# Patient Record
Sex: Male | Born: 1960 | Race: White | Hispanic: No | Smoking: Former smoker
Health system: Southern US, Community
[De-identification: ages and names within clinical notes are randomized; demographics above are authoritative.]

## PROBLEM LIST (undated history)

## (undated) DIAGNOSIS — Z9189 Other specified personal risk factors, not elsewhere classified: Secondary | ICD-10-CM

## (undated) DIAGNOSIS — R079 Chest pain, unspecified: Secondary | ICD-10-CM

## (undated) DIAGNOSIS — S31139A Puncture wound of abdominal wall without foreign body, unspecified quadrant without penetration into peritoneal cavity, initial encounter: Secondary | ICD-10-CM

## (undated) DIAGNOSIS — W3400XA Accidental discharge from unspecified firearms or gun, initial encounter: Secondary | ICD-10-CM

## (undated) DIAGNOSIS — F1210 Cannabis abuse, uncomplicated: Secondary | ICD-10-CM

## (undated) DIAGNOSIS — E669 Obesity, unspecified: Secondary | ICD-10-CM

## (undated) DIAGNOSIS — C189 Malignant neoplasm of colon, unspecified: Secondary | ICD-10-CM

## (undated) DIAGNOSIS — W57XXXA Bitten or stung by nonvenomous insect and other nonvenomous arthropods, initial encounter: Secondary | ICD-10-CM

## (undated) DIAGNOSIS — J45909 Unspecified asthma, uncomplicated: Secondary | ICD-10-CM

## (undated) DIAGNOSIS — J449 Chronic obstructive pulmonary disease, unspecified: Secondary | ICD-10-CM

## (undated) DIAGNOSIS — Z720 Tobacco use: Secondary | ICD-10-CM

## (undated) DIAGNOSIS — I639 Cerebral infarction, unspecified: Secondary | ICD-10-CM

## (undated) DIAGNOSIS — C679 Malignant neoplasm of bladder, unspecified: Secondary | ICD-10-CM

## (undated) DIAGNOSIS — N200 Calculus of kidney: Secondary | ICD-10-CM

## (undated) HISTORY — PX: EXPLORATORY LAPAROTOMY: SUR591

## (undated) HISTORY — DX: Obesity, unspecified: E66.9

## (undated) HISTORY — DX: Calculus of kidney: N20.0

## (undated) HISTORY — PX: COLONOSCOPY: SHX5424

## (undated) HISTORY — PX: OTHER SURGICAL HISTORY: SHX169

## (undated) HISTORY — PX: CHOLECYSTECTOMY: SHX55

## (undated) HISTORY — PX: CYSTOSCOPY/RETROGRADE/URETEROSCOPY/STONE EXTRACTION WITH BASKET: SHX5317

## (undated) HISTORY — DX: Tobacco use: Z72.0

## (undated) HISTORY — DX: Cannabis abuse, uncomplicated: F12.10

## (undated) HISTORY — DX: Chest pain, unspecified: R07.9

## (undated) HISTORY — PX: ESOPHAGOGASTRODUODENOSCOPY: SHX5428

## (undated) HISTORY — PX: HERNIA REPAIR: SHX51

---

## 2001-01-10 ENCOUNTER — Encounter (HOSPITAL_COMMUNITY)
Admission: RE | Admit: 2001-01-10 | Discharge: 2001-02-09 | Payer: Self-pay | Admitting: Certified Registered Nurse Anesthetist

## 2001-02-07 ENCOUNTER — Ambulatory Visit (HOSPITAL_COMMUNITY): Admission: RE | Admit: 2001-02-07 | Discharge: 2001-02-07 | Payer: Self-pay | Admitting: Family Medicine

## 2001-02-07 ENCOUNTER — Encounter: Payer: Self-pay | Admitting: Family Medicine

## 2001-06-12 ENCOUNTER — Encounter: Payer: Self-pay | Admitting: Family Medicine

## 2001-06-12 ENCOUNTER — Inpatient Hospital Stay (HOSPITAL_COMMUNITY): Admission: RE | Admit: 2001-06-12 | Discharge: 2001-06-15 | Payer: Self-pay | Admitting: Family Medicine

## 2001-06-12 ENCOUNTER — Ambulatory Visit (HOSPITAL_COMMUNITY): Admission: RE | Admit: 2001-06-12 | Discharge: 2001-06-12 | Payer: Self-pay | Admitting: Family Medicine

## 2001-06-13 ENCOUNTER — Encounter: Payer: Self-pay | Admitting: Neurology

## 2001-06-13 ENCOUNTER — Encounter: Payer: Self-pay | Admitting: Family Medicine

## 2001-07-19 ENCOUNTER — Encounter: Payer: Self-pay | Admitting: Emergency Medicine

## 2001-07-19 ENCOUNTER — Emergency Department (HOSPITAL_COMMUNITY): Admission: EM | Admit: 2001-07-19 | Discharge: 2001-07-20 | Payer: Self-pay | Admitting: Emergency Medicine

## 2001-07-20 ENCOUNTER — Encounter: Payer: Self-pay | Admitting: Emergency Medicine

## 2002-02-06 ENCOUNTER — Encounter: Payer: Self-pay | Admitting: Family Medicine

## 2002-02-06 ENCOUNTER — Ambulatory Visit (HOSPITAL_COMMUNITY): Admission: RE | Admit: 2002-02-06 | Discharge: 2002-02-06 | Payer: Self-pay | Admitting: Family Medicine

## 2003-03-19 ENCOUNTER — Ambulatory Visit (HOSPITAL_COMMUNITY): Admission: RE | Admit: 2003-03-19 | Discharge: 2003-03-19 | Payer: Self-pay | Admitting: Family Medicine

## 2003-03-19 ENCOUNTER — Encounter: Payer: Self-pay | Admitting: Family Medicine

## 2003-04-27 ENCOUNTER — Emergency Department (HOSPITAL_COMMUNITY): Admission: EM | Admit: 2003-04-27 | Discharge: 2003-04-28 | Payer: Self-pay | Admitting: Emergency Medicine

## 2003-11-01 ENCOUNTER — Ambulatory Visit (HOSPITAL_COMMUNITY): Admission: RE | Admit: 2003-11-01 | Discharge: 2003-11-01 | Payer: Self-pay | Admitting: Family Medicine

## 2004-04-19 ENCOUNTER — Emergency Department (HOSPITAL_COMMUNITY): Admission: EM | Admit: 2004-04-19 | Discharge: 2004-04-19 | Payer: Self-pay | Admitting: Emergency Medicine

## 2004-04-21 ENCOUNTER — Ambulatory Visit (HOSPITAL_COMMUNITY): Admission: RE | Admit: 2004-04-21 | Discharge: 2004-04-21 | Payer: Self-pay | Admitting: Orthopaedic Surgery

## 2005-04-21 ENCOUNTER — Emergency Department (HOSPITAL_COMMUNITY): Admission: EM | Admit: 2005-04-21 | Discharge: 2005-04-21 | Payer: Self-pay | Admitting: Emergency Medicine

## 2005-07-23 ENCOUNTER — Ambulatory Visit (HOSPITAL_COMMUNITY): Admission: RE | Admit: 2005-07-23 | Discharge: 2005-07-23 | Payer: Self-pay | Admitting: Family Medicine

## 2005-09-14 ENCOUNTER — Ambulatory Visit (HOSPITAL_COMMUNITY): Admission: RE | Admit: 2005-09-14 | Discharge: 2005-09-14 | Payer: Self-pay | Admitting: Urology

## 2006-02-09 ENCOUNTER — Emergency Department (HOSPITAL_COMMUNITY): Admission: EM | Admit: 2006-02-09 | Discharge: 2006-02-09 | Payer: Self-pay | Admitting: Emergency Medicine

## 2006-06-30 ENCOUNTER — Ambulatory Visit (HOSPITAL_COMMUNITY): Admission: RE | Admit: 2006-06-30 | Discharge: 2006-06-30 | Payer: Self-pay | Admitting: Family Medicine

## 2006-06-30 ENCOUNTER — Emergency Department (HOSPITAL_COMMUNITY): Admission: EM | Admit: 2006-06-30 | Discharge: 2006-06-30 | Payer: Self-pay | Admitting: Emergency Medicine

## 2007-05-22 ENCOUNTER — Ambulatory Visit (HOSPITAL_COMMUNITY): Admission: RE | Admit: 2007-05-22 | Discharge: 2007-05-22 | Payer: Self-pay | Admitting: Family Medicine

## 2007-07-25 ENCOUNTER — Ambulatory Visit (HOSPITAL_COMMUNITY): Admission: RE | Admit: 2007-07-25 | Discharge: 2007-07-25 | Payer: Self-pay | Admitting: Family Medicine

## 2007-08-16 ENCOUNTER — Emergency Department (HOSPITAL_COMMUNITY): Admission: EM | Admit: 2007-08-16 | Discharge: 2007-08-16 | Payer: Self-pay | Admitting: Emergency Medicine

## 2007-09-29 ENCOUNTER — Ambulatory Visit (HOSPITAL_COMMUNITY): Admission: RE | Admit: 2007-09-29 | Discharge: 2007-09-29 | Payer: Self-pay | Admitting: Family Medicine

## 2007-12-29 ENCOUNTER — Emergency Department (HOSPITAL_COMMUNITY): Admission: EM | Admit: 2007-12-29 | Discharge: 2007-12-29 | Payer: Self-pay | Admitting: Emergency Medicine

## 2008-09-13 DIAGNOSIS — I639 Cerebral infarction, unspecified: Secondary | ICD-10-CM

## 2008-09-13 HISTORY — DX: Cerebral infarction, unspecified: I63.9

## 2008-10-24 ENCOUNTER — Ambulatory Visit (HOSPITAL_COMMUNITY): Admission: RE | Admit: 2008-10-24 | Discharge: 2008-10-24 | Payer: Self-pay | Admitting: Family Medicine

## 2009-01-11 ENCOUNTER — Emergency Department (HOSPITAL_COMMUNITY): Admission: EM | Admit: 2009-01-11 | Discharge: 2009-01-11 | Payer: Self-pay | Admitting: Emergency Medicine

## 2009-03-01 ENCOUNTER — Emergency Department (HOSPITAL_COMMUNITY): Admission: EM | Admit: 2009-03-01 | Discharge: 2009-03-01 | Payer: Self-pay | Admitting: Emergency Medicine

## 2009-10-03 ENCOUNTER — Ambulatory Visit (HOSPITAL_COMMUNITY): Admission: RE | Admit: 2009-10-03 | Discharge: 2009-10-03 | Payer: Self-pay | Admitting: Family Medicine

## 2010-01-19 ENCOUNTER — Ambulatory Visit (HOSPITAL_COMMUNITY): Admission: RE | Admit: 2010-01-19 | Discharge: 2010-01-19 | Payer: Self-pay | Admitting: Family Medicine

## 2010-01-30 ENCOUNTER — Ambulatory Visit (HOSPITAL_COMMUNITY): Admission: RE | Admit: 2010-01-30 | Discharge: 2010-01-30 | Payer: Self-pay | Admitting: *Deleted

## 2010-02-11 ENCOUNTER — Ambulatory Visit (HOSPITAL_COMMUNITY): Admission: RE | Admit: 2010-02-11 | Discharge: 2010-02-12 | Payer: Self-pay | Admitting: Urology

## 2010-02-11 ENCOUNTER — Encounter (INDEPENDENT_AMBULATORY_CARE_PROVIDER_SITE_OTHER): Payer: Self-pay | Admitting: Urology

## 2010-04-16 ENCOUNTER — Ambulatory Visit: Payer: Self-pay | Admitting: Gastroenterology

## 2010-04-16 DIAGNOSIS — R1310 Dysphagia, unspecified: Secondary | ICD-10-CM | POA: Insufficient documentation

## 2010-04-16 DIAGNOSIS — K921 Melena: Secondary | ICD-10-CM | POA: Insufficient documentation

## 2010-04-16 DIAGNOSIS — K5900 Constipation, unspecified: Secondary | ICD-10-CM | POA: Insufficient documentation

## 2010-04-16 DIAGNOSIS — K219 Gastro-esophageal reflux disease without esophagitis: Secondary | ICD-10-CM | POA: Insufficient documentation

## 2010-04-16 DIAGNOSIS — R109 Unspecified abdominal pain: Secondary | ICD-10-CM | POA: Insufficient documentation

## 2010-04-22 ENCOUNTER — Encounter: Payer: Self-pay | Admitting: Gastroenterology

## 2010-05-14 ENCOUNTER — Ambulatory Visit (HOSPITAL_COMMUNITY): Admission: RE | Admit: 2010-05-14 | Discharge: 2010-05-14 | Payer: Self-pay | Admitting: Internal Medicine

## 2010-05-14 ENCOUNTER — Ambulatory Visit: Payer: Self-pay | Admitting: Internal Medicine

## 2010-05-19 ENCOUNTER — Encounter: Payer: Self-pay | Admitting: Internal Medicine

## 2010-05-26 ENCOUNTER — Encounter: Payer: Self-pay | Admitting: Internal Medicine

## 2010-06-13 HISTORY — PX: CYSTOSTOMY W/ BLADDER BIOPSY: SHX1431

## 2010-06-22 ENCOUNTER — Ambulatory Visit (HOSPITAL_COMMUNITY): Admission: RE | Admit: 2010-06-22 | Discharge: 2010-06-22 | Payer: Self-pay | Admitting: Urology

## 2010-07-17 ENCOUNTER — Ambulatory Visit (HOSPITAL_COMMUNITY): Admission: RE | Admit: 2010-07-17 | Discharge: 2010-07-17 | Payer: Self-pay | Admitting: Family Medicine

## 2010-10-15 NOTE — Letter (Signed)
Summary: [facility] MEDICAL RECORDS  [facility] MEDICAL RECORDS   Imported By: Rexene Alberts 05/26/2010 08:52:41  _____________________________________________________________________  External Attachment:    Type:   Image     Comment:   External Document
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
## 2010-10-15 NOTE — Letter (Signed)
Summary: Patient Notice, Colon Biopsy Results  [facility]  [address]   [place], [place] [address]   Phone: [phone]  Fax: [phone]       May 19, 2010   Craig Owens [address] [place], [place]  [address] [birth year]    Dear Craig Owens,  I am pleased to inform you that the biopsies taken during your recent colonoscopy did not show any evidence of cancer upon pathologic examination.  Additional information/recommendations:  You should have a repeat colonoscopy examination  in 5 years.  Please call us if you are having persistent problems or have questions about your condition that have not been fully answered at this time.  Sincerely,    R. Roetta Sessions MD, FACP [facility] Ph: [phone]    Fax: [phone]   Appended Document: Patient Notice, Colon Biopsy Results Letter mailed to pt.  Appended Document: Patient Notice, Colon Biopsy Results reminder in computer

## 2010-10-15 NOTE — Letter (Signed)
Summary: tcs/egd/ed order  tcs/egd/ed order   Imported By: Hendricks Limes LPN 16/06/9603 54:09:81  _____________________________________________________________________  External Attachment:    Type:   Image     Comment:   External Document

## 2010-11-25 LAB — BASIC METABOLIC PANEL
CO2: 23 mEq/L (ref 19–32)
Calcium: 9.3 mg/dL (ref 8.4–10.5)
Chloride: 108 mEq/L (ref 96–112)
GFR calc Af Amer: >60 mL/min (ref >60)
Potassium: 3.7 mEq/L (ref 3.5–5.1)
Sodium: 138 mEq/L (ref 135–145)

## 2010-11-25 LAB — SURGICAL PCR SCREEN: Staphylococcus aureus: POSITIVE — AB

## 2010-11-25 LAB — CBC
HCT: 44.5 % (ref 39.0–52.0)
Hemoglobin: 15 g/dL (ref 13.0–17.0)
MCH: 29.7 pg (ref 26.0–34.0)
MCV: 88 fL (ref 78.0–100.0)
RBC: 5.06 MIL/uL (ref 4.22–5.81)

## 2010-11-26 LAB — BASIC METABOLIC PANEL WITH GFR
BUN: 9 mg/dL (ref 6–23)
CO2: 24 meq/L (ref 19–32)
Calcium: 9.2 mg/dL (ref 8.4–10.5)
Chloride: 107 meq/L (ref 96–112)
Creatinine, Ser: 0.98 mg/dL (ref 0.4–1.5)
GFR calc non Af Amer: >60 mL/min
Glucose, Bld: 144 mg/dL — ABNORMAL HIGH (ref 70–99)
Potassium: 3.8 meq/L (ref 3.5–5.1)
Sodium: 139 meq/L (ref 135–145)

## 2010-11-26 LAB — HEMOGLOBIN AND HEMATOCRIT, BLOOD
HCT: 42.8 % (ref 39.0–52.0)
HCT: 45 % (ref 39.0–52.0)
Hemoglobin: 15 g/dL (ref 13.0–17.0)

## 2010-11-30 LAB — BASIC METABOLIC PANEL
BUN: 11 mg/dL (ref 6–23)
CO2: 21 mEq/L (ref 19–32)
Calcium: 9.3 mg/dL (ref 8.4–10.5)
Chloride: 105 mEq/L (ref 96–112)
Chloride: 107 mEq/L (ref 96–112)
Creatinine, Ser: 1.05 mg/dL (ref 0.4–1.5)
GFR calc Af Amer: >60 mL/min (ref >60)
GFR calc non Af Amer: >60 mL/min (ref >60)
Glucose, Bld: 143 mg/dL — ABNORMAL HIGH (ref 70–99)
Potassium: 3.5 mEq/L (ref 3.5–5.1)
Sodium: 137 mEq/L (ref 135–145)

## 2010-11-30 LAB — DIFFERENTIAL
Eosinophils Absolute: 0.3 10*3/uL (ref 0.0–0.7)
Eosinophils Relative: 2 % (ref 0–5)
Lymphocytes Relative: 22 % (ref 12–46)
Lymphs Abs: 2.6 10*3/uL (ref 0.7–4.0)
Monocytes Absolute: 0.6 10*3/uL (ref 0.1–1.0)
Monocytes Relative: 5 % (ref 3–12)

## 2010-11-30 LAB — CBC
HCT: 41.2 % (ref 39.0–52.0)
Hemoglobin: 13.9 g/dL (ref 13.0–17.0)
Hemoglobin: 14.9 g/dL (ref 13.0–17.0)
MCHC: 33.8 g/dL (ref 30.0–36.0)
MCV: 87.8 fL (ref 78.0–100.0)
MCV: 87.2 fL (ref 78.0–100.0)
RBC: 4.69 MIL/uL (ref 4.22–5.81)
RBC: 5.07 MIL/uL (ref 4.22–5.81)
RDW: 13.4 % (ref 11.5–15.5)
WBC: 11.8 10*3/uL — ABNORMAL HIGH (ref 4.0–10.5)

## 2010-11-30 LAB — BLEEDING TIME: Bleeding Time: 4.5 minutes (ref 2.5–9.5)

## 2011-01-29 NOTE — Discharge Summary (Signed)
[facility]  Patient:    Craig Owens, Craig Owens Visit Number: [id number] MRN: [id number]          Service Type: MED Location: 3 A318 01 Attending Physician:  Alice Reichert Dictated by:   Butch Penny, M.D. Admit Date:  06/12/2001 Discharge Date: 06/15/2001                             Discharge Summary  DISCHARGE DIAGNOSES: 1. Cerebrovascular accident, right pontine area. 2. Gastroenteritis. 3. Right ureteral calculous. 4. Hyperlipidemia.  HISTORY OF PRESENT ILLNESS:  This is a 50 year old, white male admitted on June 12, 2001, and discharged on June 15, 2001, with three days of hospitalization with diagnoses of CVA, right pontine area and gastroenteritis. Condition was stable and improved at the time of discharge.  He was admitted with chief complaint being nausea, vomiting and diarrhea with lower abdominal discomfort.  The patient had impaired vision for approximately three days and developed vomiting and diarrhea with lower abdominal crampy pain.  Apparently, he noted diminished vision in both eyes.  He was admitted to the hospital.  PHYSICAL EXAMINATION:  GENERAL:  Alert, white male complaining of slight headache.  VITAL SIGNS:  Blood pressure 112/60, pulse 68, respiratory rate 18, temperature 97.5.  HEENT:  PERRLA.  TMs negative.  Oropharynx benign.  Decreased vision in right eye.  NECK:  Supple with no carotid bruits.  HEART:  Regular rate and rhythm with no murmurs.  LUNGS:  Clear to P&A.  ABDOMEN:  Surgical scars over the anterior abdomen with no palpable organs or masses.  GENITALIA:  Negative.  EXTREMITIES:  Free of edema.  NEUROLOGIC:  Weakness in both legs.  Weakness in right eye lid.  LABORATORY DATA AND X-RAY FINDINGS:  Admission CBC with WBC 9900, hemoglobin 15.2, hematocrit 43.5, 58 neutrophils, 33 lymphs, 7 monos.  Sodium 139, potassium 3.3, chloride 105, CO2 25, glucose 154, BUN 15, creatinine 1, calcium 9.1.   Total protein 6.9, albumin 3.6.  Lipid profile with cholesterol 219, triglycerides 260, HDL 33, LDL 134.  Homocystine level 12.57.  Urinalysis negative.  Chest x-ray stable with mild to moderate changes of COPD with no acute abnormality.  Carotid duplex with bilateral normal examination.  CT of the abdomen with abnormal appearance of anterior wall to prior surgery and probable colostomy.  CT of the pelvis with contrast with 67 mm diameter right ureterovesical junction calculous.  Minimal distention in the right renal collecting system.  MRI of hip without contrast with high-grade stenosis proximal to the right of superior cerebellar artery with very high-grade stenosis of the proximal right posterior and inferior cerebellar artery.  HOSPITAL COURSE:  At the time of the patients admission, he was placed on D-5-1/2 normal saline at 125 cc an hour.  Vital signs and neurologic checks were monitored q.i.d.  He was given Phenergan 25 mg q.4h. p.r.n. for nausea and Demerol 25 mg q.4h. p.r.n. pain.  He was started on Lovenox 125 mg q.12h. Neurology was asked to see him.  MRA of the brain was done and homocystine level was done.  His Lovenox was discontinued on October 1.  He was started on aspirin 325 mg daily.  Carotid Doppler ultrasound was performed which was normal.  He was subsequently placed on Plavix 75 mg daily and Lipitor 20 mg daily.  He was seen by urology.  A stone was noted in distal right ureter 67 mm in diameter.  It  was elected to give him a chance to pass the stone.  The patient remained in the hospital a total of three days and was discharged.  He will be followed as an outpatient.  DISCHARGE MEDICATIONS: 1. Plavix 75 mg daily. 2. Enteric coated aspirin 81 mg daily. 3. Lortab 5 mg q.4h. p.r.n. for pain. 4. Lipitor 20 mg daily. Dictated by:   Butch Penny, M.D. Attending Physician:  Alice Reichert DD:  07/17/01 TD:  07/18/01 Job: [id number]

## 2011-01-29 NOTE — Group Therapy Note (Signed)
[facility]  Patient:    Craig Owens, Craig Owens Visit Number: [id number] MRN: [id number]          Service Type: MED Location: 3 A318 01 Attending Physician:  Alice Reichert Dictated by:   Butch Penny, M.D. Admit Date:  06/12/2001 Discharge Date: 06/15/2001                     Progress Note/EKG Interpretations  SUBJECTIVE:  This patient was admitted to the hospital with what appeared to be CVA.  He did have abdominal pain on admission and does have a stone in his right ureter.  OBJECTIVE:  Vital Signs:  Blood pressure 109/55, respirations 20, pulse 59, temperature 97.9.  Lungs:  Clear to P&A.  Heart:  Regular rhythm.  Abdomen: No palpable organs or masses.  The patient has a scar over the anterior portion of the abdomen from previous surgery.  Seems like tenderness in the right lower quadrant.  ASSESSMENT:  The patient has a stone in his right ureter at the UV junction. He also was admitted with what appears to be CVA.  The patients condition is stable.  An MRI of his head showed occlusion, high-grade stenosis, proximal left, anterior, inferior cerebellar artery.  PLAN:  The plan is to continue with current regimen. Dictated by:   Butch Penny, M.D. Attending Physician:  Alice Reichert DD:  06/15/01 TD:  06/16/01 Job: [id number]

## 2011-03-11 ENCOUNTER — Emergency Department (HOSPITAL_COMMUNITY)
Admission: EM | Admit: 2011-03-11 | Discharge: 2011-03-11 | Disposition: A | Discharge status: Home / Self Care | Payer: Medicare Other | Attending: Emergency Medicine | Admitting: Emergency Medicine

## 2011-03-11 DIAGNOSIS — Z79899 Other long term (current) drug therapy: Secondary | ICD-10-CM | POA: Insufficient documentation

## 2011-03-11 DIAGNOSIS — G8929 Other chronic pain: Secondary | ICD-10-CM | POA: Insufficient documentation

## 2011-03-11 DIAGNOSIS — M545 Low back pain, unspecified: Secondary | ICD-10-CM | POA: Insufficient documentation

## 2011-03-11 DIAGNOSIS — F1210 Cannabis abuse, uncomplicated: Secondary | ICD-10-CM | POA: Insufficient documentation

## 2011-03-11 DIAGNOSIS — F17200 Nicotine dependence, unspecified, uncomplicated: Secondary | ICD-10-CM | POA: Insufficient documentation

## 2011-04-22 ENCOUNTER — Ambulatory Visit (HOSPITAL_COMMUNITY)
Admission: RE | Admit: 2011-04-22 | Discharge: 2011-04-22 | Disposition: A | Discharge status: Home / Self Care | Payer: Medicare Other | Source: Ambulatory Visit | Attending: Urology | Admitting: Urology

## 2011-04-22 ENCOUNTER — Other Ambulatory Visit (HOSPITAL_COMMUNITY): Payer: Self-pay | Admitting: Urology

## 2011-04-22 ENCOUNTER — Encounter: Payer: Self-pay | Admitting: *Deleted

## 2011-04-22 ENCOUNTER — Emergency Department (HOSPITAL_COMMUNITY)
Admission: EM | Admit: 2011-04-22 | Discharge: 2011-04-22 | Disposition: A | Discharge status: Home / Self Care | Payer: Medicare Other | Attending: Emergency Medicine | Admitting: Emergency Medicine

## 2011-04-22 DIAGNOSIS — Z85038 Personal history of other malignant neoplasm of large intestine: Secondary | ICD-10-CM | POA: Insufficient documentation

## 2011-04-22 DIAGNOSIS — N200 Calculus of kidney: Secondary | ICD-10-CM | POA: Insufficient documentation

## 2011-04-22 DIAGNOSIS — F17200 Nicotine dependence, unspecified, uncomplicated: Secondary | ICD-10-CM | POA: Insufficient documentation

## 2011-04-22 DIAGNOSIS — R109 Unspecified abdominal pain: Secondary | ICD-10-CM | POA: Insufficient documentation

## 2011-04-22 DIAGNOSIS — Z8673 Personal history of transient ischemic attack (TIA), and cerebral infarction without residual deficits: Secondary | ICD-10-CM | POA: Insufficient documentation

## 2011-04-22 DIAGNOSIS — M25469 Effusion, unspecified knee: Secondary | ICD-10-CM | POA: Insufficient documentation

## 2011-04-22 HISTORY — DX: Accidental discharge from unspecified firearms or gun, initial encounter: W34.00XA

## 2011-04-22 HISTORY — DX: Cerebral infarction, unspecified: I63.9

## 2011-04-22 HISTORY — DX: Malignant neoplasm of colon, unspecified: C18.9

## 2011-04-22 HISTORY — DX: Puncture wound of abdominal wall without foreign body, unspecified quadrant without penetration into peritoneal cavity, initial encounter: S31.139A

## 2011-04-22 LAB — URINALYSIS, ROUTINE W REFLEX MICROSCOPIC
Glucose, UA: 500 mg/dL — AB
Urobilinogen, UA: 0.2 mg/dL (ref 0.0–1.0)

## 2011-04-22 LAB — URINE MICROSCOPIC-ADD ON

## 2011-04-22 MED ORDER — ONDANSETRON HCL 4 MG PO TABS
4.0000 mg | ORAL_TABLET | Freq: Once | ORAL | Status: AC
  Administered 2011-04-22: 4 mg via ORAL
  Filled 2011-04-22: qty 1

## 2011-04-22 MED ORDER — IBUPROFEN 800 MG PO TABS
800.0000 mg | ORAL_TABLET | Freq: Once | ORAL | Status: AC
  Administered 2011-04-22: 800 mg via ORAL
  Filled 2011-04-22: qty 1

## 2011-04-22 MED ORDER — OXYCODONE-ACETAMINOPHEN 5-325 MG PO TABS: 1.0000 | ORAL_TABLET | ORAL | Status: AC | PRN

## 2011-04-22 MED ORDER — OXYCODONE-ACETAMINOPHEN 5-325 MG PO TABS
2.0000 | ORAL_TABLET | Freq: Once | ORAL | Status: AC
  Administered 2011-04-22: 2 via ORAL
  Filled 2011-04-22: qty 2

## 2011-04-22 MED ORDER — ONDANSETRON HCL 4 MG PO TABS: 4.0000 mg | ORAL_TABLET | Freq: Four times a day (QID) | ORAL | Status: AC

## 2011-04-22 NOTE — ED Notes (Signed)
Patient with left flank pain since 1800, hx of kidney stones

## 2011-04-22 NOTE — ED Notes (Signed)
Pt reports having flank pain and nausea "all day", h/o kidney stones. Denies any urinary s/s, does not have any meds for pain, ran out of his percocet 1 1/2 weeks ago--is only given 60 per month.  Will be able to get meds filled next weeks.

## 2011-04-22 NOTE — ED Notes (Signed)
Pt given blanket and cont. To await md
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
## 2011-04-22 NOTE — ED Notes (Signed)
Pt has been asleep, awoke by md for exam, tolerated meds well.  Stated he usually gets phenergan for his nausea.  Requested cup of water, 1/2 cup given.

## 2011-06-08 LAB — URINE MICROSCOPIC-ADD ON

## 2011-06-08 LAB — COMPREHENSIVE METABOLIC PANEL
AST: 18
Albumin: 3.7
CO2: 24
Calcium: 9.5
Creatinine, Ser: 1.39
GFR calc Af Amer: >60
GFR calc non Af Amer: 54 — ABNORMAL LOW
Sodium: 137
Total Protein: 7.1

## 2011-06-08 LAB — DIFFERENTIAL
Eosinophils Relative: 1
Lymphocytes Relative: 19
Lymphs Abs: 3.1
Monocytes Relative: 6

## 2011-06-08 LAB — CBC
HCT: 42.8
Hemoglobin: 15.1
MCHC: 35.3
MCV: 84.2
Platelets: 233
RBC: 5.09
RDW: 13.7
WBC: 15.8 — ABNORMAL HIGH

## 2011-06-08 LAB — URINALYSIS, ROUTINE W REFLEX MICROSCOPIC
Leukocytes, UA: NEGATIVE
Nitrite: NEGATIVE
Specific Gravity, Urine: 1.025
Urobilinogen, UA: 0.2
pH: 5

## 2011-07-30 ENCOUNTER — Ambulatory Visit (HOSPITAL_COMMUNITY)
Admission: RE | Admit: 2011-07-30 | Discharge: 2011-07-30 | Disposition: A | Discharge status: Home / Self Care | Payer: Medicare Other | Source: Ambulatory Visit | Attending: Family Medicine | Admitting: Family Medicine

## 2011-07-30 ENCOUNTER — Other Ambulatory Visit (HOSPITAL_COMMUNITY): Payer: Self-pay | Admitting: Family Medicine

## 2011-07-30 DIAGNOSIS — F17210 Nicotine dependence, cigarettes, uncomplicated: Secondary | ICD-10-CM

## 2011-07-30 DIAGNOSIS — J45909 Unspecified asthma, uncomplicated: Secondary | ICD-10-CM | POA: Insufficient documentation

## 2011-07-30 DIAGNOSIS — F17200 Nicotine dependence, unspecified, uncomplicated: Secondary | ICD-10-CM | POA: Insufficient documentation

## 2011-10-11 ENCOUNTER — Ambulatory Visit (HOSPITAL_COMMUNITY)
Admission: RE | Admit: 2011-10-11 | Discharge: 2011-10-11 | Disposition: A | Discharge status: Home / Self Care | Payer: Medicare Other | Source: Ambulatory Visit | Attending: Urology | Admitting: Urology

## 2011-10-11 ENCOUNTER — Other Ambulatory Visit (HOSPITAL_COMMUNITY): Payer: Self-pay | Admitting: Urology

## 2011-10-11 DIAGNOSIS — N200 Calculus of kidney: Secondary | ICD-10-CM | POA: Insufficient documentation

## 2011-10-11 DIAGNOSIS — C679 Malignant neoplasm of bladder, unspecified: Secondary | ICD-10-CM

## 2011-11-28 ENCOUNTER — Encounter (HOSPITAL_COMMUNITY): Payer: Self-pay | Admitting: Emergency Medicine

## 2011-11-28 ENCOUNTER — Emergency Department (HOSPITAL_COMMUNITY): Payer: Medicare Other

## 2011-11-28 ENCOUNTER — Emergency Department (HOSPITAL_COMMUNITY)
Admission: EM | Admit: 2011-11-28 | Discharge: 2011-11-28 | Disposition: A | Discharge status: Home / Self Care | Payer: Medicare Other | Attending: Emergency Medicine | Admitting: Emergency Medicine

## 2011-11-28 DIAGNOSIS — R079 Chest pain, unspecified: Secondary | ICD-10-CM | POA: Insufficient documentation

## 2011-11-28 DIAGNOSIS — M546 Pain in thoracic spine: Secondary | ICD-10-CM | POA: Insufficient documentation

## 2011-11-28 DIAGNOSIS — F17200 Nicotine dependence, unspecified, uncomplicated: Secondary | ICD-10-CM | POA: Insufficient documentation

## 2011-11-28 DIAGNOSIS — M549 Dorsalgia, unspecified: Secondary | ICD-10-CM

## 2011-11-28 DIAGNOSIS — Z79899 Other long term (current) drug therapy: Secondary | ICD-10-CM | POA: Insufficient documentation

## 2011-11-28 MED ORDER — HYDROMORPHONE HCL PF 1 MG/ML IJ SOLN
1.0000 mg | Freq: Once | INTRAMUSCULAR | Status: AC
  Administered 2011-11-28: 1 mg via INTRAMUSCULAR
  Filled 2011-11-28: qty 1

## 2011-11-28 MED ORDER — CYCLOBENZAPRINE HCL 10 MG PO TABS: ORAL_TABLET | ORAL | Status: DC

## 2011-11-28 NOTE — Discharge Instructions (Signed)
Follow up with your md this week. °

## 2011-11-28 NOTE — ED Notes (Signed)
Pt c/o upper back pain radiating around his left side. Pt states he injured it moving a mattress x 2 days ago.
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
## 2011-12-02 ENCOUNTER — Encounter (HOSPITAL_COMMUNITY): Payer: Self-pay | Admitting: Pharmacy Technician

## 2011-12-07 ENCOUNTER — Encounter (HOSPITAL_COMMUNITY): Payer: Self-pay | Admitting: *Deleted

## 2011-12-07 NOTE — Patient Instructions (Addendum)
20 Craig Owens  12/07/2011   Your procedure is scheduled on:  April 2,2013  Report to [facility] at 7:30 AM.  Call this number if you have problems the morning of surgery: [phone]   Remember:   Do not drink or  eat food:After Midnight.    Clear liquids include soda, tea, black coffee, apple or grape juice, broth.  Take these medicines the morning of surgery with A SIP OF WATER:     Albuterol inhaler   Do not wear jewelry, make-up or nail polish.  Do not wear lotions, powders, or perfumes. You may wear deodorant.  Do not shave 48 hours prior to surgery.  Do not bring valuables to the hospital.  Contacts, dentures or bridgework may not be worn into surgery.  Leave suitcase in the car. After surgery it may be brought to your room.  For patients admitted to the hospital, checkout time is 11:00 AM the day of discharge.   Patients discharged the day of surgery will not be allowed to drive home.  Name and phone number of your driver:   Special Instructions: CHG Shower Shower 2 days before surgery and 1 day before surgery with Hibiclens.   Please read over the following fact sheets that you were given: Pain Booklet, MRSA Information, Anesthesia Post-op Instructions and Care and Recovery After Surgery

## 2011-12-08 ENCOUNTER — Other Ambulatory Visit: Payer: Self-pay

## 2011-12-08 ENCOUNTER — Encounter (HOSPITAL_COMMUNITY)
Admission: RE | Admit: 2011-12-08 | Discharge: 2011-12-08 | Disposition: A | Discharge status: Home / Self Care | Payer: Medicare Other | Source: Ambulatory Visit | Attending: Urology | Admitting: Urology

## 2011-12-08 ENCOUNTER — Encounter (HOSPITAL_COMMUNITY): Payer: Self-pay

## 2011-12-08 LAB — BASIC METABOLIC PANEL
Chloride: 100 mEq/L (ref 96–112)
Creatinine, Ser: 1.01 mg/dL (ref 0.50–1.35)
GFR calc Af Amer: >90 mL/min (ref >90)
Potassium: 3.6 mEq/L (ref 3.5–5.1)
Sodium: 136 mEq/L (ref 135–145)

## 2011-12-08 LAB — CBC
MCV: 86.7 fL (ref 78.0–100.0)
Platelets: 254 10*3/uL (ref 150–400)
RDW: 13.1 % (ref 11.5–15.5)
WBC: 9.6 10*3/uL (ref 4.0–10.5)

## 2011-12-08 LAB — SURGICAL PCR SCREEN: MRSA, PCR: NEGATIVE

## 2011-12-14 ENCOUNTER — Encounter (HOSPITAL_COMMUNITY)
Admission: RE | Disposition: A | Discharge status: Home / Self Care | Payer: Self-pay | Source: Ambulatory Visit | Attending: Urology

## 2011-12-14 ENCOUNTER — Ambulatory Visit (HOSPITAL_COMMUNITY)
Admission: RE | Admit: 2011-12-14 | Discharge: 2011-12-14 | Disposition: A | Discharge status: Home / Self Care | Payer: Medicare Other | Source: Ambulatory Visit | Attending: Urology | Admitting: Urology

## 2011-12-14 ENCOUNTER — Ambulatory Visit (HOSPITAL_COMMUNITY): Payer: Medicare Other | Admitting: Anesthesiology

## 2011-12-14 ENCOUNTER — Encounter (HOSPITAL_COMMUNITY): Payer: Self-pay | Admitting: *Deleted

## 2011-12-14 ENCOUNTER — Encounter (HOSPITAL_COMMUNITY): Payer: Self-pay | Admitting: Anesthesiology

## 2011-12-14 ENCOUNTER — Ambulatory Visit (HOSPITAL_COMMUNITY): Payer: Medicare Other

## 2011-12-14 DIAGNOSIS — K5900 Constipation, unspecified: Secondary | ICD-10-CM

## 2011-12-14 DIAGNOSIS — Z8673 Personal history of transient ischemic attack (TIA), and cerebral infarction without residual deficits: Secondary | ICD-10-CM | POA: Insufficient documentation

## 2011-12-14 DIAGNOSIS — R1310 Dysphagia, unspecified: Secondary | ICD-10-CM

## 2011-12-14 DIAGNOSIS — J449 Chronic obstructive pulmonary disease, unspecified: Secondary | ICD-10-CM | POA: Insufficient documentation

## 2011-12-14 DIAGNOSIS — J4489 Other specified chronic obstructive pulmonary disease: Secondary | ICD-10-CM | POA: Insufficient documentation

## 2011-12-14 DIAGNOSIS — R109 Unspecified abdominal pain: Secondary | ICD-10-CM

## 2011-12-14 DIAGNOSIS — Z85528 Personal history of other malignant neoplasm of kidney: Secondary | ICD-10-CM | POA: Insufficient documentation

## 2011-12-14 DIAGNOSIS — C679 Malignant neoplasm of bladder, unspecified: Secondary | ICD-10-CM

## 2011-12-14 DIAGNOSIS — R6889 Other general symptoms and signs: Secondary | ICD-10-CM | POA: Insufficient documentation

## 2011-12-14 DIAGNOSIS — F17200 Nicotine dependence, unspecified, uncomplicated: Secondary | ICD-10-CM | POA: Insufficient documentation

## 2011-12-14 DIAGNOSIS — K921 Melena: Secondary | ICD-10-CM

## 2011-12-14 DIAGNOSIS — K219 Gastro-esophageal reflux disease without esophagitis: Secondary | ICD-10-CM

## 2011-12-14 DIAGNOSIS — Z09 Encounter for follow-up examination after completed treatment for conditions other than malignant neoplasm: Secondary | ICD-10-CM | POA: Insufficient documentation

## 2011-12-14 HISTORY — DX: Chronic obstructive pulmonary disease, unspecified: J44.9

## 2011-12-14 HISTORY — PX: CYSTOSCOPY WITH BIOPSY: SHX5122

## 2011-12-14 HISTORY — PX: CYSTOSCOPY W/ RETROGRADES: SHX1426

## 2011-12-14 HISTORY — PX: EXAMINATION UNDER ANESTHESIA: SHX1540

## 2011-12-14 SURGERY — CYSTOSCOPY, WITH BIOPSY
Anesthesia: Spinal | Site: Ureter | Wound class: Clean Contaminated

## 2011-12-14 MED ORDER — SODIUM CHLORIDE 0.9 % IR SOLN
Status: DC | PRN
  Administered 2011-12-14: 3000 mL

## 2011-12-14 MED ORDER — BUPIVACAINE IN DEXTROSE 0.75-8.25 % IT SOLN
INTRATHECAL | Status: DC | PRN
  Administered 2011-12-14: 15 mg via INTRATHECAL

## 2011-12-14 MED ORDER — FENTANYL CITRATE 0.05 MG/ML IJ SOLN
INTRAMUSCULAR | Status: AC
  Filled 2011-12-14: qty 2

## 2011-12-14 MED ORDER — LACTATED RINGERS IV SOLN
INTRAVENOUS | Status: DC
  Administered 2011-12-14: 09:00:00 via INTRAVENOUS

## 2011-12-14 MED ORDER — ALBUTEROL SULFATE (5 MG/ML) 0.5% IN NEBU
2.5000 mg | INHALATION_SOLUTION | Freq: Once | RESPIRATORY_TRACT | Status: AC
  Administered 2011-12-14: 2.5 mg via RESPIRATORY_TRACT

## 2011-12-14 MED ORDER — GLYCOPYRROLATE 0.2 MG/ML IJ SOLN
0.2000 mg | Freq: Once | INTRAMUSCULAR | Status: AC
  Administered 2011-12-14: 0.2 mg via INTRAVENOUS

## 2011-12-14 MED ORDER — GLYCINE 1.5 % IR SOLN
Status: DC | PRN
  Administered 2011-12-14: 3000 mL

## 2011-12-14 MED ORDER — PROPOFOL 10 MG/ML IV EMUL
INTRAVENOUS | Status: AC
  Filled 2011-12-14: qty 20

## 2011-12-14 MED ORDER — FENTANYL CITRATE 0.05 MG/ML IJ SOLN: 25.0000 ug | INTRAMUSCULAR | Status: DC | PRN

## 2011-12-14 MED ORDER — SODIUM CHLORIDE 0.9 % IN NEBU
INHALATION_SOLUTION | RESPIRATORY_TRACT | Status: AC
  Filled 2011-12-14: qty 3

## 2011-12-14 MED ORDER — LIDOCAINE HCL (CARDIAC) 10 MG/ML IV SOLN
INTRAVENOUS | Status: DC | PRN
  Administered 2011-12-14: 10 mg via INTRAVENOUS

## 2011-12-14 MED ORDER — FENTANYL CITRATE 0.05 MG/ML IJ SOLN
INTRAMUSCULAR | Status: DC | PRN
  Administered 2011-12-14: 12.5 ug via INTRATHECAL

## 2011-12-14 MED ORDER — MIDAZOLAM HCL 2 MG/2ML IJ SOLN
1.0000 mg | INTRAMUSCULAR | Status: DC | PRN
  Administered 2011-12-14: 2 mg via INTRAVENOUS

## 2011-12-14 MED ORDER — ALBUTEROL SULFATE (5 MG/ML) 0.5% IN NEBU
INHALATION_SOLUTION | RESPIRATORY_TRACT | Status: AC
  Filled 2011-12-14: qty 0.5

## 2011-12-14 MED ORDER — GLYCOPYRROLATE 0.2 MG/ML IJ SOLN
INTRAMUSCULAR | Status: AC
  Filled 2011-12-14: qty 1

## 2011-12-14 MED ORDER — LIDOCAINE HCL (PF) 1 % IJ SOLN
INTRAMUSCULAR | Status: AC
  Filled 2011-12-14: qty 5

## 2011-12-14 MED ORDER — MUPIROCIN 2 % EX OINT
TOPICAL_OINTMENT | CUTANEOUS | Status: AC
  Filled 2011-12-14: qty 22

## 2011-12-14 MED ORDER — PROPOFOL 10 MG/ML IV EMUL
INTRAVENOUS | Status: DC | PRN
  Administered 2011-12-14: 100 ug/kg/min via INTRAVENOUS

## 2011-12-14 MED ORDER — MIDAZOLAM HCL 2 MG/2ML IJ SOLN
INTRAMUSCULAR | Status: AC
  Filled 2011-12-14: qty 2

## 2011-12-14 MED ORDER — EPHEDRINE SULFATE 50 MG/ML IJ SOLN
INTRAMUSCULAR | Status: DC | PRN
  Administered 2011-12-14: 5 mg via INTRAVENOUS

## 2011-12-14 MED ORDER — BUPIVACAINE IN DEXTROSE 0.75-8.25 % IT SOLN
INTRATHECAL | Status: AC
  Filled 2011-12-14: qty 2

## 2011-12-14 MED ORDER — EPHEDRINE SULFATE 50 MG/ML IJ SOLN
INTRAMUSCULAR | Status: AC
  Filled 2011-12-14: qty 1

## 2011-12-14 MED ORDER — IOHEXOL 350 MG/ML SOLN
INTRAVENOUS | Status: DC | PRN
  Administered 2011-12-14: 50 mL via INTRAVENOUS

## 2011-12-14 MED ORDER — FENTANYL CITRATE 0.05 MG/ML IJ SOLN
INTRAMUSCULAR | Status: DC | PRN
  Administered 2011-12-14: 12.5 ug via INTRAVENOUS

## 2011-12-14 MED ORDER — MIDAZOLAM HCL 5 MG/5ML IJ SOLN
INTRAMUSCULAR | Status: DC | PRN
  Administered 2011-12-14: 2 mg via INTRAVENOUS

## 2011-12-14 MED ORDER — ONDANSETRON HCL 4 MG/2ML IJ SOLN: 4.0000 mg | Freq: Once | INTRAMUSCULAR | Status: DC | PRN

## 2011-12-14 MED ORDER — STERILE WATER FOR IRRIGATION IR SOLN
Status: DC | PRN
  Administered 2011-12-14: 1000 mL

## 2011-12-14 SURGICAL SUPPLY — 25 items
BAG DRAIN URO TABLE W/ADPT NS (DRAPE) ×4 IMPLANT
BAG DRN 8 ADPR NS SKTRN CSTL (DRAPE) ×3
BAG HAMPER (MISCELLANEOUS) ×4 IMPLANT
CATH 5 FR WEDGE TIP (UROLOGICAL SUPPLIES) ×4 IMPLANT
CLOTH BEACON ORANGE TIMEOUT ST (SAFETY) ×4 IMPLANT
DECANTER SPIKE VIAL GLASS SM (MISCELLANEOUS) ×4 IMPLANT
DILATOR BALLN URETERAL SET (BALLOONS) IMPLANT
FLOOR PAD 36X40 (MISCELLANEOUS)
FORMALIN 10 PREFIL 120ML (MISCELLANEOUS) ×22 IMPLANT
GLOVE BIO SURGEON STRL SZ7 (GLOVE) ×4 IMPLANT
GLYCINE 1.5% IRRIG UROMATIC (IV SOLUTION) ×4 IMPLANT
GOWN STRL REIN XL XLG (GOWN DISPOSABLE) ×5 IMPLANT
IV NS IRRIG 3000ML ARTHROMATIC (IV SOLUTION) ×8 IMPLANT
KIT ROOM TURNOVER AP CYSTO (KITS) ×4 IMPLANT
MANIFOLD NEPTUNE II (INSTRUMENTS) ×4 IMPLANT
NDL HYPO 18GX1.5 BLUNT FILL (NEEDLE) ×3 IMPLANT
NEEDLE HYPO 18GX1.5 BLUNT FILL (NEEDLE) ×4 IMPLANT
PACK CYSTO (CUSTOM PROCEDURE TRAY) ×4 IMPLANT
PAD ARMBOARD 7.5X6 YLW CONV (MISCELLANEOUS) ×4 IMPLANT
PAD FLOOR 36X40 (MISCELLANEOUS) ×3 IMPLANT
PAD TELFA 3X4 1S STER (GAUZE/BANDAGES/DRESSINGS) ×4 IMPLANT
STENT PERCUFLEX 4.8FRX24 (STENTS) IMPLANT
TOWEL OR 17X26 4PK STRL BLUE (TOWEL DISPOSABLE) ×4 IMPLANT
WATER STERILE IRR 1000ML POUR (IV SOLUTION) ×4 IMPLANT
WIRE GUIDE BENTSON .035 15CM (WIRE) ×4 IMPLANT

## 2011-12-14 NOTE — Transfer of Care (Signed)
Immediate Anesthesia Transfer of Care Note  Patient: Craig Owens  Procedure(s) Performed: Procedure(s) (LRB): CYSTOSCOPY WITH BIOPSY (N/A) CYSTOSCOPY WITH RETROGRADE PYELOGRAM (Bilateral) EXAM UNDER ANESTHESIA ()  Patient Location: PACU  Anesthesia Type: SAB  Level of Consciousness: awake  Airway & Oxygen Therapy: Patient Spontanous Breathing and non-rebreather face mask  Post-op Assessment: Report given to PACU RN, Post -op Vital signs reviewed and stable. SAB Level  T6  Post vital signs: Reviewed and stable  Complications: No apparent anesthesia complications

## 2011-12-14 NOTE — Anesthesia Postprocedure Evaluation (Signed)
Anesthesia Post Note  Patient: Craig Owens  Procedure(s) Performed: Procedure(s) (LRB): CYSTOSCOPY WITH BIOPSY (N/A) CYSTOSCOPY WITH RETROGRADE PYELOGRAM (Bilateral) EXAM UNDER ANESTHESIA ()  Anesthesia type: Spinal  Patient location: PACU  Post pain: Pain level controlled  Post assessment: Post-op Vital signs reviewed, Patient's Cardiovascular Status Stable, Respiratory Function Stable, Patent Airway, No signs of Nausea or vomiting and Pain level controlled  Last Vitals:  Filed Vitals:   12/14/11 1045  BP: 94/65  Pulse: 77  Temp:   Resp: 18    Post vital signs: Reviewed and stable  Level of consciousness: awake and alert   Complications: No apparent anesthesia complications

## 2011-12-14 NOTE — Discharge Instructions (Signed)
Spinal Anesthesia Care After HOME CARE INSTRUCTIONS  Do not drive or operate machinery for at least 24 hours after receiving anesthesia. Make sure someone is available to drive you home.   Do not drink alcohol for at least 24 hours after receiving anesthesia.   Do not make important decisions for 24 hours after having spinal or epidural anesthesia. Your thinking may be unclear.   Have someone stay with you for at least 24 to 48 hours following surgery.   Drink lots of fluids when you get home. If you are an adult, drink eight, 8 ounce glasses of water per day, or as directed.   Keep your post-operative appointments as suggested.  SEEK IMMEDIATE MEDICAL CARE IF:   You develop a fever or any temperature over 100.4 F (38 C).   You have a persistent or severe headache.   You develop blurred or double vision.   You develop dizziness, fainting or lightheadedness.   You have weakness, numbness, or tingling in your arms or legs.   You develop a skin rash.   You have difficulty breathing.   You have persistent nausea and vomiting.   You are unable to pass urine.  Document Released: 11/20/2003 Document Revised: 08/19/2011 Document Reviewed: 10/03/2007 [facility] Patient Information 2012 [place], [place].Cystoscopy (Bladder Exam) Care After Refer to this sheet in the next few weeks. These discharge instructions provide you with general information on caring for yourself after you leave the hospital. Your caregiver may also give you specific instructions. Your treatment has been planned according to the most current medical practices available, but unavoidable complications sometimes occur. If you have any problems or questions after discharge, please call your caregiver. AFTER THE PROCEDURE   There may be temporary bleeding and burning with urination.   Drink enough water and fluids to keep your urine clear or pale yellow.  FINDING OUT THE RESULTS OF YOUR TEST Not all test results are  available during your visit. If your test results are not back during the visit, make an appointment with your caregiver to find out the results. Do not assume everything is normal if you have not heard from your caregiver or the medical facility. It is important for you to follow up on all of your test results. SEEK IMMEDIATE MEDICAL CARE IF:   There is an increase in blood in the urine or you are passing clots.   There is difficulty passing urine.   You develop the chills.   You have an oral temperature above 102 F (38.9 C), not controlled by medicine.   Belly (abdominal) pain develops.  Document Released: 03/19/2005 Document Revised: 08/19/2011 Document Reviewed: 01/15/2008 [facility] Patient Information 2012 [place], [place].

## 2011-12-14 NOTE — Anesthesia Preprocedure Evaluation (Signed)
Anesthesia Evaluation  Patient identified by MRN, date of birth, ID band Patient awake    Reviewed: Allergy & Precautions, H&P , NPO status , Patient's Chart, lab work & pertinent test results  Airway Mallampati: I      Dental  (+) Edentulous Upper and Edentulous Lower   Pulmonary COPDCurrent Smoker,  breath sounds clear to auscultation        Cardiovascular Rhythm:Regular Rate:Normal  One near syncopal episode month ago when he got SOB. Resolved with inhaler use, none since.   Neuro/Psych CVA    GI/Hepatic GERD-  Medicated and Controlled,  Endo/Other    Renal/GU      Musculoskeletal   Abdominal   Peds  Hematology   Anesthesia Other Findings   Reproductive/Obstetrics                           Anesthesia Physical Anesthesia Plan  ASA: III  Anesthesia Plan: Spinal   Post-op Pain Management:    Induction:   Airway Management Planned: Nasal Cannula  Additional Equipment:   Intra-op Plan:   Post-operative Plan:   Informed Consent: I have reviewed the patients History and Physical, chart, labs and discussed the procedure including the risks, benefits and alternatives for the proposed anesthesia with the patient or authorized representative who has indicated his/her understanding and acceptance.     Plan Discussed with:   Anesthesia Plan Comments:         Anesthesia Quick Evaluation

## 2011-12-14 NOTE — Progress Notes (Signed)
Awake. Sleeping at intervals. Coke given to drink. Tolerated well. Unable to move lower extremities. Muscle twitching at knees.

## 2011-12-14 NOTE — Progress Notes (Signed)
Talked with wife. Pt condition discussed. Voiced understanding. 

## 2011-12-14 NOTE — Progress Notes (Signed)
No drainage from meatus.  

## 2011-12-14 NOTE — Progress Notes (Signed)
Able to raise both legs without difficulty. Full sensation both legs. States bottom is still numb. Stood up with help. States legs are not steady enough. Helped back to stretcher .

## 2011-12-14 NOTE — Progress Notes (Signed)
No change in repeat H&P. 

## 2011-12-14 NOTE — Op Note (Signed)
NAME:  Craig Owens, Craig Owens                ACCOUNT NO.:  [id number]  MEDICAL RECORD NO.:  [id number]  LOCATION:  [facility]                          FACILITY:  [facility]  PHYSICIAN:  Ky Barban, M.D.DATE OF BIRTH:  March 15, 1961  DATE OF PROCEDURE: DATE OF DISCHARGE:                              OPERATIVE REPORT   PREOPERATIVE DIAGNOSES: 1. History of bladder cancer. 2. Positive urine cytologies.  POSTOPERATIVE DIAGNOSIS:  Normal examination.  PROCEDURES: 1. Cystoscopy. 2. Multiple bladder biopsies. 3. Bilateral retrograde pyelogram.  ANESTHESIA:  General endotracheal.  PROCEDURE IN DETAIL:  The patient under general endotracheal anesthesia in lithotomy position, usual prep and drape, #25 cystoscope introduced into the bladder.  It was inspected.  There was no gross tumor seen in the bladder.  Then, using a flexible biopsy forceps, multiple biopsies were done randomly, first on the posterior bladder wall and second right bladder wall, third the left bladder wall, the last one at the anterior bladder wall were done.  Then the areas of the biopsy site were fulgurated with [facility] electrode.  After this, I proceeded to do bilateral retrograde pyelogram, right ureteral orifice catheterized with a wedge catheter.  Hypaque was injected under fluoroscopic control.  The dye goes up into the upper ureter.  The ureter was followed up slowly with the fluoroscope.  The ureter looks normal caliber.  No filling defect.  The renal pelvis fills up nicely.  No filling defect in the renal pelvis.  Similarly, on the left side, the ureter and the renal pelvis fills up nicely.  No filling defect is seen on the left side also.  At this point, cystoscope was removed after emptying the bladder and bimanual pelvic exam was done.  Prostate 2+ smooth and firm.  I do not feel any mass in the bladder.  It is supple.  All the instruments were removed.  The patient left the operating room in  satisfactory condition.     Ky Barban, M.D.    MIJ/MEDQ  D:  12/14/2011  T:  12/14/2011  Job:  [id number]
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
## 2011-12-14 NOTE — Progress Notes (Signed)
Able to stand and walk without any difficulty.
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
## 2011-12-14 NOTE — Brief Op Note (Signed)
12/14/2011  9:52 AM  PATIENT:  Mervin Hack  51 y.o. male  PRE-OPERATIVE DIAGNOSIS:  bladder cancer  POST-OPERATIVE DIAGNOSIS:  bladder cancer  PROCEDURE:  Procedure(s) (LRB): CYSTOSCOPY WITH BIOPSY (N/A) CYSTOSCOPY WITH RETROGRADE PYELOGRAM (Bilateral) EXAM UNDER ANESTHESIA ()  SURGEON:  Surgeon(s) and Role:    * Ky Barban, MD - Primary  PHYSICIAN ASSISTANT:   ASSISTANTS: none   ANESTHESIA:   general  EBL:  Total I/O In: 650 [I.V.:650] Out: 0   BLOOD ADMINISTERED:none  DRAINS: none   LOCAL MEDICATIONS USED:  NONE  SPECIMEN:  Source of Specimen:  mutiple bladder biopsies.  DISPOSITION OF SPECIMEN:  PATHOLOGY  COUNTS:  YES  TOURNIQUET:  * No tourniquets in log *  DICTATION: .Other Dictation: Dictation Number ditation [id number]  PLAN OF CARE: Discharge to home after PACU  PATIENT DISPOSITION:  PACU - hemodynamically stable.   Delay start of Pharmacological VTE agent (>24hrs) due to surgical blood loss or risk of bleeding:

## 2011-12-16 ENCOUNTER — Encounter (HOSPITAL_COMMUNITY): Payer: Self-pay | Admitting: Urology

## 2011-12-24 ENCOUNTER — Emergency Department (HOSPITAL_COMMUNITY): Payer: Medicare Other

## 2011-12-24 ENCOUNTER — Emergency Department (HOSPITAL_COMMUNITY)
Admission: EM | Admit: 2011-12-24 | Discharge: 2011-12-25 | Disposition: A | Discharge status: Home / Self Care | Payer: Medicare Other | Attending: Emergency Medicine | Admitting: Emergency Medicine

## 2011-12-24 ENCOUNTER — Encounter (HOSPITAL_COMMUNITY): Payer: Self-pay | Admitting: Emergency Medicine

## 2011-12-24 DIAGNOSIS — R319 Hematuria, unspecified: Secondary | ICD-10-CM | POA: Insufficient documentation

## 2011-12-24 DIAGNOSIS — R10811 Right upper quadrant abdominal tenderness: Secondary | ICD-10-CM | POA: Insufficient documentation

## 2011-12-24 DIAGNOSIS — R079 Chest pain, unspecified: Secondary | ICD-10-CM | POA: Insufficient documentation

## 2011-12-24 DIAGNOSIS — N390 Urinary tract infection, site not specified: Secondary | ICD-10-CM | POA: Insufficient documentation

## 2011-12-24 DIAGNOSIS — R109 Unspecified abdominal pain: Secondary | ICD-10-CM | POA: Insufficient documentation

## 2011-12-24 HISTORY — DX: Malignant neoplasm of bladder, unspecified: C67.9

## 2011-12-24 LAB — CBC
Hemoglobin: 13.8 g/dL (ref 13.0–17.0)
MCH: 28.9 pg (ref 26.0–34.0)
MCHC: 33.1 g/dL (ref 30.0–36.0)
Platelets: 205 10*3/uL (ref 150–400)
RDW: 13.2 % (ref 11.5–15.5)

## 2011-12-24 LAB — BASIC METABOLIC PANEL
Calcium: 9.3 mg/dL (ref 8.4–10.5)
GFR calc Af Amer: >90 mL/min (ref >90)
GFR calc non Af Amer: 81 mL/min — ABNORMAL LOW (ref >90)
Potassium: 3.4 mEq/L — ABNORMAL LOW (ref 3.5–5.1)
Sodium: 138 mEq/L (ref 135–145)

## 2011-12-24 LAB — URINE MICROSCOPIC-ADD ON

## 2011-12-24 LAB — URINALYSIS, ROUTINE W REFLEX MICROSCOPIC
Glucose, UA: NEGATIVE mg/dL
Protein, ur: >300 mg/dL — AB
Urobilinogen, UA: 2 mg/dL — ABNORMAL HIGH (ref 0.0–1.0)

## 2011-12-24 MED ORDER — DEXTROSE 5 % IV SOLN
1.0000 g | Freq: Once | INTRAVENOUS | Status: AC
  Administered 2011-12-24: 1 g via INTRAVENOUS
  Filled 2011-12-24: qty 10

## 2011-12-24 NOTE — ED Notes (Signed)
Pt states he had surgery 4/2 for bladder cancer and today at 5pm had gross blood in his urine.
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
## 2011-12-25 MED ORDER — IPRATROPIUM BROMIDE 0.02 % IN SOLN
0.5000 mg | Freq: Once | RESPIRATORY_TRACT | Status: AC
  Administered 2011-12-25: 0.5 mg via RESPIRATORY_TRACT
  Filled 2011-12-25: qty 2.5

## 2011-12-25 MED ORDER — ALBUTEROL SULFATE (5 MG/ML) 0.5% IN NEBU
2.5000 mg | INHALATION_SOLUTION | Freq: Once | RESPIRATORY_TRACT | Status: AC
  Administered 2011-12-25: 2.5 mg via RESPIRATORY_TRACT
  Filled 2011-12-25: qty 0.5

## 2011-12-25 MED ORDER — HYDROMORPHONE HCL 2 MG PO TABS: 2.0000 mg | ORAL_TABLET | ORAL | Status: AC | PRN

## 2011-12-25 MED ORDER — CEPHALEXIN 500 MG PO CAPS: 500.0000 mg | ORAL_CAPSULE | Freq: Four times a day (QID) | ORAL | Status: AC

## 2011-12-25 NOTE — Discharge Instructions (Signed)
followup with urology on Tuesday as scheduled. Take antibiotic as recommended until completed for probable urinary tract infection.return for new or worse symptoms. Return if unable to void. Take pain medicine as needed.  Urinary Tract Infection Infections of the urinary tract can start in several places. A bladder infection (cystitis), a kidney infection (pyelonephritis), and a prostate infection (prostatitis) are different types of urinary tract infections (UTIs). They usually get better if treated with medicines (antibiotics) that kill germs. Take all the medicine until it is gone. You or your child may feel better in a few days, but TAKE ALL MEDICINE or the infection may not respond and may become more difficult to treat. HOME CARE INSTRUCTIONS   Drink enough water and fluids to keep the urine clear or pale yellow. Cranberry juice is especially recommended, in addition to large amounts of water.   Avoid caffeine, tea, and carbonated beverages. They tend to irritate the bladder.   Alcohol may irritate the prostate.   Only take over-the-counter or prescription medicines for pain, discomfort, or fever as directed by your caregiver.  To prevent further infections:  Empty the bladder often. Avoid holding urine for long periods of time.   After a bowel movement, women should cleanse from front to back. Use each tissue only once.   Empty the bladder before and after sexual intercourse.  FINDING OUT THE RESULTS OF YOUR TEST Not all test results are available during your visit. If your or your child's test results are not back during the visit, make an appointment with your caregiver to find out the results. Do not assume everything is normal if you have not heard from your caregiver or the medical facility. It is important for you to follow up on all test results. SEEK MEDICAL CARE IF:   There is back pain.   Your baby is older than 3 months with a rectal temperature of 100.5 F (38.1 C) or  higher for more than 1 day.   Your or your child's problems (symptoms) are no better in 3 days. Return sooner if you or your child is getting worse.  SEEK IMMEDIATE MEDICAL CARE IF:   There is severe back pain or lower abdominal pain.   You or your child develops chills.   You have a fever.   Your baby is older than 3 months with a rectal temperature of 102 F (38.9 C) or higher.   Your baby is 74 months old or younger with a rectal temperature of 100.4 F (38 C) or higher.   There is nausea or vomiting.   There is continued burning or discomfort with urination.  MAKE SURE YOU:   Understand these instructions.   Will watch your condition.   Will get help right away if you are not doing well or get worse.  Document Released: 06/09/2005 Document Revised: 08/19/2011 Document Reviewed: 01/12/2007 [facility] Patient Information 2012 [place], [place].Hematuria, Adult Hematuria (blood in your urine) can be caused by a bladder infection (cystitis), kidney infection (pyelonephritis), prostate infection (prostatitis), or kidney stone. Infections will usually respond to antibiotics (medications which kill germs), and a kidney stone will usually pass through your urine without further treatment. If you were put on antibiotics, take all the medicine until gone. You may feel better in a few days, but take all of your medicine or the infection may not respond and become more difficult to treat. If antibiotics were not given, an infection did not cause the blood in the urine. A further work  up to find out the reason may be needed. HOME CARE INSTRUCTIONS   Drink lots of fluid, 3 to 4 quarts a day. If you have been diagnosed with an infection, cranberry juice is especially recommended, in addition to large amounts of water.   Avoid caffeine, tea, and carbonated beverages, because they tend to irritate the bladder.   Avoid alcohol as it may irritate the prostate.   Only take over-the-counter or  prescription medicines for pain, discomfort, or fever as directed by your caregiver.   If you have been diagnosed with a kidney stone follow your caregivers instructions regarding straining your urine to catch the stone.  TO PREVENT FURTHER INFECTIONS:  Empty the bladder often. Avoid holding urine for long periods of time.   After a bowel movement, women should cleanse front to back. Use each tissue only once.   Empty the bladder before and after sexual intercourse if you are a male.   Return to your caregiver if you develop back pain, fever, nausea (feeling sick to your stomach), vomiting, or your symptoms (problems) are not better in 3 days. Return sooner if you are getting worse.  If you have been requested to return for further testing make sure to keep your appointments. If an infection is not the cause of blood in your urine, X-rays may be required. Your caregiver will discuss this with you. SEEK IMMEDIATE MEDICAL CARE IF:   You have a persistent fever over 102 F (38.9 C).   You develop severe vomiting and are unable to keep the medication down.   You develop severe back or abdominal pain despite taking your medications.   You begin passing a large amount of blood or clots in your urine.   You feel extremely weak or faint, or pass out.  MAKE SURE YOU:   Understand these instructions.   Will watch your condition.   Will get help right away if you are not doing well or get worse.  Document Released: 08/30/2005 Document Revised: 08/19/2011 Document Reviewed: 04/18/2008 [facility] Patient Information 2012 [place], [place].

## 2011-12-26 LAB — URINE CULTURE: Culture  Setup Time: 201304132045

## 2012-04-14 ENCOUNTER — Encounter (HOSPITAL_COMMUNITY): Payer: Self-pay | Admitting: *Deleted

## 2012-04-14 ENCOUNTER — Emergency Department (HOSPITAL_COMMUNITY): Payer: Medicare Other

## 2012-04-14 ENCOUNTER — Emergency Department (HOSPITAL_COMMUNITY)
Admission: EM | Admit: 2012-04-14 | Discharge: 2012-04-14 | Disposition: A | Discharge status: Home / Self Care | Payer: Medicare Other | Attending: Emergency Medicine | Admitting: Emergency Medicine

## 2012-04-14 DIAGNOSIS — Z8551 Personal history of malignant neoplasm of bladder: Secondary | ICD-10-CM | POA: Insufficient documentation

## 2012-04-14 DIAGNOSIS — R079 Chest pain, unspecified: Secondary | ICD-10-CM | POA: Insufficient documentation

## 2012-04-14 DIAGNOSIS — J449 Chronic obstructive pulmonary disease, unspecified: Secondary | ICD-10-CM | POA: Insufficient documentation

## 2012-04-14 DIAGNOSIS — Z85038 Personal history of other malignant neoplasm of large intestine: Secondary | ICD-10-CM | POA: Insufficient documentation

## 2012-04-14 DIAGNOSIS — Z8673 Personal history of transient ischemic attack (TIA), and cerebral infarction without residual deficits: Secondary | ICD-10-CM | POA: Insufficient documentation

## 2012-04-14 DIAGNOSIS — F17200 Nicotine dependence, unspecified, uncomplicated: Secondary | ICD-10-CM | POA: Insufficient documentation

## 2012-04-14 DIAGNOSIS — J4489 Other specified chronic obstructive pulmonary disease: Secondary | ICD-10-CM | POA: Insufficient documentation

## 2012-04-14 LAB — BASIC METABOLIC PANEL
CO2: 22 mEq/L (ref 19–32)
Calcium: 9.6 mg/dL (ref 8.4–10.5)
Chloride: 106 mEq/L (ref 96–112)
Creatinine, Ser: 1.11 mg/dL (ref 0.50–1.35)
Glucose, Bld: 122 mg/dL — ABNORMAL HIGH (ref 70–99)

## 2012-04-14 LAB — CBC WITH DIFFERENTIAL/PLATELET
Basophils Absolute: 0 10*3/uL (ref 0.0–0.1)
Eosinophils Absolute: 0.3 10*3/uL (ref 0.0–0.7)
Eosinophils Relative: 3 % (ref 0–5)
HCT: 45.2 % (ref 39.0–52.0)
Lymphocytes Relative: 29 % (ref 12–46)
Lymphs Abs: 2.8 10*3/uL (ref 0.7–4.0)
MCH: 29.5 pg (ref 26.0–34.0)
MCV: 87.3 fL (ref 78.0–100.0)
Monocytes Absolute: 0.7 10*3/uL (ref 0.1–1.0)
Platelets: 246 10*3/uL (ref 150–400)
RDW: 13.4 % (ref 11.5–15.5)
WBC: 9.7 10*3/uL (ref 4.0–10.5)

## 2012-04-14 LAB — TROPONIN I: Troponin I: <0.3 ng/mL (ref <0.30)

## 2012-04-14 MED ORDER — ASPIRIN 81 MG PO CHEW
324.0000 mg | CHEWABLE_TABLET | Freq: Once | ORAL | Status: AC
  Administered 2012-04-14: 324 mg via ORAL
  Filled 2012-04-14: qty 4

## 2012-04-14 MED ORDER — NITROGLYCERIN 0.4 MG SL SUBL
0.4000 mg | SUBLINGUAL_TABLET | SUBLINGUAL | Status: DC | PRN
  Administered 2012-04-14: 0.4 mg via SUBLINGUAL
  Filled 2012-04-14: qty 25

## 2012-04-14 NOTE — ED Notes (Signed)
Pt refusing admission to hospital, risks and benefits explained to pt and family at bedside, pt still refused to be admitted to hospital.

## 2012-04-14 NOTE — ED Notes (Signed)
Pt states center CP x 3 days which will sometime move to left chest. Described as intermittent, sharp, squeezing pain at times. Reports productive cough, white in color which began this morning. NAD.
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
## 2012-04-14 NOTE — ED Notes (Signed)
Pt refusing admission, risks and benefits of hospital admission and  Leaving ama explained to pt and family member, pt still refusing admission,
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
## 2012-04-21 ENCOUNTER — Ambulatory Visit: Payer: Medicare Other | Admitting: Cardiovascular Disease

## 2012-04-24 ENCOUNTER — Encounter: Payer: Self-pay | Admitting: Cardiovascular Disease

## 2012-04-24 ENCOUNTER — Ambulatory Visit (INDEPENDENT_AMBULATORY_CARE_PROVIDER_SITE_OTHER): Payer: Medicaid Other | Admitting: Cardiovascular Disease

## 2012-04-24 VITALS — BP 122/80 | HR 80 | Ht 73.0 in | Wt 265.1 lb

## 2012-04-24 DIAGNOSIS — R079 Chest pain, unspecified: Secondary | ICD-10-CM

## 2012-04-24 DIAGNOSIS — I6350 Cerebral infarction due to unspecified occlusion or stenosis of unspecified cerebral artery: Secondary | ICD-10-CM

## 2012-04-24 DIAGNOSIS — I639 Cerebral infarction, unspecified: Secondary | ICD-10-CM

## 2012-04-24 DIAGNOSIS — F17200 Nicotine dependence, unspecified, uncomplicated: Secondary | ICD-10-CM

## 2012-04-24 NOTE — Assessment & Plan Note (Signed)
Counseled for less than 10 minutes. Little motivation to quit.  CXR ok in ER

## 2012-04-24 NOTE — Patient Instructions (Addendum)
Your physician recommends that you continue on your current medications as directed. Please refer to the Current Medication list given to you today.  Your physician has requested that you have a lexiscan myoview. For further information please visit [email or web address] Please follow instruction sheet, as given.  Your physician recommends that you schedule a follow-up appointment in: we will contact you with results of your test.
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
## 2012-04-24 NOTE — Assessment & Plan Note (Signed)
Atypical but vascular disease with previous stroke.  Unable to walk well  F/U lexiscan myovue

## 2012-05-04 ENCOUNTER — Encounter (HOSPITAL_COMMUNITY)
Admission: RE | Admit: 2012-05-04 | Discharge: 2012-05-04 | Disposition: A | Discharge status: Home / Self Care | Payer: Medicare Other | Source: Ambulatory Visit | Attending: Cardiovascular Disease | Admitting: Cardiovascular Disease

## 2012-05-04 ENCOUNTER — Ambulatory Visit (INDEPENDENT_AMBULATORY_CARE_PROVIDER_SITE_OTHER): Payer: Medicare Other

## 2012-05-04 ENCOUNTER — Encounter (HOSPITAL_COMMUNITY): Payer: Self-pay

## 2012-05-04 ENCOUNTER — Encounter (HOSPITAL_COMMUNITY): Payer: Self-pay | Admitting: Cardiology

## 2012-05-04 DIAGNOSIS — R079 Chest pain, unspecified: Secondary | ICD-10-CM | POA: Insufficient documentation

## 2012-05-04 HISTORY — DX: Unspecified asthma, uncomplicated: J45.909

## 2012-05-04 MED ORDER — TECHNETIUM TC 99M TETROFOSMIN IV KIT
30.0000 | PACK | Freq: Once | INTRAVENOUS | Status: AC | PRN
  Administered 2012-05-04: 30 via INTRAVENOUS

## 2012-05-04 MED ORDER — TECHNETIUM TC 99M TETROFOSMIN IV KIT
10.0000 | PACK | Freq: Once | INTRAVENOUS | Status: AC | PRN
  Administered 2012-05-04: 9.5 via INTRAVENOUS

## 2012-05-04 NOTE — Progress Notes (Signed)
Stress Lab Nurses Notes - Craig Owens  Craig Owens 05/04/2012 Reason for doing test: Chest Pain Type of test: Steffanie Dunn Nurse performing test: Parke Poisson, RN Nuclear Medicine Tech: Dillard Essex Echo Tech: Not Applicable MD performing test: R. Rothbart Family MD: McInnis Test explained and consent signed: yes IV started: 22g jelco, Saline lock flushed, No redness or edema and Saline lock started in radiology Symptoms: SOB Treatment/Intervention: None Reason test stopped: protocol completed After recovery IV was: Discontinued via X-ray tech and No redness or edema Patient to return to Nuc. Med at :12:15 Patient discharged: Home Patient's Condition upon discharge was: stable Comments: During test BP 102/52 & HR 86.  Recovery BP 110/60 & HR 74.  Symptoms resolved in recovery. Erskine Speed T

## 2013-01-15 ENCOUNTER — Encounter (HOSPITAL_COMMUNITY): Payer: Self-pay | Admitting: *Deleted

## 2013-01-15 ENCOUNTER — Emergency Department (HOSPITAL_COMMUNITY)
Admission: EM | Admit: 2013-01-15 | Discharge: 2013-01-15 | Disposition: A | Discharge status: Home / Self Care | Payer: Medicare Other | Attending: Emergency Medicine | Admitting: Emergency Medicine

## 2013-01-15 DIAGNOSIS — Z87442 Personal history of urinary calculi: Secondary | ICD-10-CM | POA: Insufficient documentation

## 2013-01-15 DIAGNOSIS — J449 Chronic obstructive pulmonary disease, unspecified: Secondary | ICD-10-CM | POA: Insufficient documentation

## 2013-01-15 DIAGNOSIS — Z8673 Personal history of transient ischemic attack (TIA), and cerebral infarction without residual deficits: Secondary | ICD-10-CM | POA: Insufficient documentation

## 2013-01-15 DIAGNOSIS — F17200 Nicotine dependence, unspecified, uncomplicated: Secondary | ICD-10-CM | POA: Insufficient documentation

## 2013-01-15 DIAGNOSIS — Z91040 Latex allergy status: Secondary | ICD-10-CM | POA: Insufficient documentation

## 2013-01-15 DIAGNOSIS — Z7902 Long term (current) use of antithrombotics/antiplatelets: Secondary | ICD-10-CM | POA: Insufficient documentation

## 2013-01-15 DIAGNOSIS — Z85038 Personal history of other malignant neoplasm of large intestine: Secondary | ICD-10-CM | POA: Insufficient documentation

## 2013-01-15 DIAGNOSIS — Z8551 Personal history of malignant neoplasm of bladder: Secondary | ICD-10-CM | POA: Insufficient documentation

## 2013-01-15 DIAGNOSIS — Y838 Other surgical procedures as the cause of abnormal reaction of the patient, or of later complication, without mention of misadventure at the time of the procedure: Secondary | ICD-10-CM | POA: Insufficient documentation

## 2013-01-15 DIAGNOSIS — J4489 Other specified chronic obstructive pulmonary disease: Secondary | ICD-10-CM | POA: Insufficient documentation

## 2013-01-15 DIAGNOSIS — Z79899 Other long term (current) drug therapy: Secondary | ICD-10-CM | POA: Insufficient documentation

## 2013-01-15 DIAGNOSIS — Z87828 Personal history of other (healed) physical injury and trauma: Secondary | ICD-10-CM | POA: Insufficient documentation

## 2013-01-15 DIAGNOSIS — IMO0002 Reserved for concepts with insufficient information to code with codable children: Secondary | ICD-10-CM | POA: Insufficient documentation

## 2013-01-15 DIAGNOSIS — L089 Local infection of the skin and subcutaneous tissue, unspecified: Secondary | ICD-10-CM

## 2013-01-15 NOTE — ED Notes (Signed)
Pus comiing out of scar to abd.  Has "piece of mesh " sticking out of abd.
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
## 2013-01-15 NOTE — ED Notes (Signed)
Pt denies pain at present time.  Reports small piece of abdominal mesh coming through old scar.  Pt reports area continues to drain occasionally, and "has for years".  Pt states "I just want him to snip it off and let me go."

## 2013-03-25 ENCOUNTER — Encounter (HOSPITAL_COMMUNITY): Payer: Self-pay

## 2013-03-25 DIAGNOSIS — Z79899 Other long term (current) drug therapy: Secondary | ICD-10-CM | POA: Insufficient documentation

## 2013-03-25 DIAGNOSIS — R296 Repeated falls: Secondary | ICD-10-CM | POA: Insufficient documentation

## 2013-03-25 DIAGNOSIS — F17200 Nicotine dependence, unspecified, uncomplicated: Secondary | ICD-10-CM | POA: Insufficient documentation

## 2013-03-25 DIAGNOSIS — S6990XA Unspecified injury of unspecified wrist, hand and finger(s), initial encounter: Secondary | ICD-10-CM | POA: Insufficient documentation

## 2013-03-25 DIAGNOSIS — Z87828 Personal history of other (healed) physical injury and trauma: Secondary | ICD-10-CM | POA: Insufficient documentation

## 2013-03-25 DIAGNOSIS — Z87442 Personal history of urinary calculi: Secondary | ICD-10-CM | POA: Insufficient documentation

## 2013-03-25 DIAGNOSIS — Y9389 Activity, other specified: Secondary | ICD-10-CM | POA: Insufficient documentation

## 2013-03-25 DIAGNOSIS — Z85038 Personal history of other malignant neoplasm of large intestine: Secondary | ICD-10-CM | POA: Insufficient documentation

## 2013-03-25 DIAGNOSIS — R209 Unspecified disturbances of skin sensation: Secondary | ICD-10-CM | POA: Insufficient documentation

## 2013-03-25 DIAGNOSIS — Z8673 Personal history of transient ischemic attack (TIA), and cerebral infarction without residual deficits: Secondary | ICD-10-CM | POA: Insufficient documentation

## 2013-03-25 DIAGNOSIS — Z91040 Latex allergy status: Secondary | ICD-10-CM | POA: Insufficient documentation

## 2013-03-25 DIAGNOSIS — Y929 Unspecified place or not applicable: Secondary | ICD-10-CM | POA: Insufficient documentation

## 2013-03-25 DIAGNOSIS — J449 Chronic obstructive pulmonary disease, unspecified: Secondary | ICD-10-CM | POA: Insufficient documentation

## 2013-03-25 DIAGNOSIS — Z8551 Personal history of malignant neoplasm of bladder: Secondary | ICD-10-CM | POA: Insufficient documentation

## 2013-03-25 DIAGNOSIS — J4489 Other specified chronic obstructive pulmonary disease: Secondary | ICD-10-CM | POA: Insufficient documentation

## 2013-03-25 DIAGNOSIS — S59909A Unspecified injury of unspecified elbow, initial encounter: Secondary | ICD-10-CM | POA: Insufficient documentation
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
## 2013-03-26 ENCOUNTER — Emergency Department (HOSPITAL_COMMUNITY)
Admit: 2013-03-26 | Discharge: 2013-03-26 | Disposition: A | Discharge status: Home / Self Care | Payer: Medicare Other | Attending: Emergency Medicine | Admitting: Emergency Medicine

## 2013-03-26 ENCOUNTER — Emergency Department (HOSPITAL_COMMUNITY)
Admission: EM | Admit: 2013-03-26 | Discharge: 2013-03-26 | Disposition: A | Discharge status: Home / Self Care | Payer: Medicare Other | Attending: Emergency Medicine | Admitting: Emergency Medicine

## 2013-03-26 DIAGNOSIS — M25532 Pain in left wrist: Secondary | ICD-10-CM

## 2013-03-26 MED ORDER — IBUPROFEN 800 MG PO TABS
800.0000 mg | ORAL_TABLET | Freq: Once | ORAL | Status: AC
  Administered 2013-03-26: 800 mg via ORAL
  Filled 2013-03-26: qty 1

## 2013-03-26 NOTE — ED Notes (Signed)
Pt alert & oriented x4, stable gait. Patient given discharge instructions, paperwork & prescription(s). Patient  instructed to stop at the registration desk to finish any additional paperwork. Patient verbalized understanding. Pt left department w/ no further questions. 
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
## 2013-06-22 ENCOUNTER — Ambulatory Visit (HOSPITAL_COMMUNITY)
Admission: RE | Admit: 2013-06-22 | Discharge: 2013-06-22 | Disposition: A | Discharge status: Home / Self Care | Payer: Medicare Other | Source: Ambulatory Visit | Attending: Family Medicine | Admitting: Family Medicine

## 2013-06-22 ENCOUNTER — Other Ambulatory Visit (HOSPITAL_COMMUNITY): Payer: Self-pay | Admitting: Family Medicine

## 2013-06-22 DIAGNOSIS — M25539 Pain in unspecified wrist: Secondary | ICD-10-CM | POA: Insufficient documentation

## 2013-06-22 DIAGNOSIS — M25532 Pain in left wrist: Secondary | ICD-10-CM

## 2013-07-22 ENCOUNTER — Encounter (HOSPITAL_COMMUNITY): Payer: Self-pay | Admitting: Emergency Medicine

## 2013-07-22 ENCOUNTER — Emergency Department (HOSPITAL_COMMUNITY): Payer: Medicare Other

## 2013-07-22 ENCOUNTER — Emergency Department (HOSPITAL_COMMUNITY)
Admission: EM | Admit: 2013-07-22 | Discharge: 2013-07-22 | Disposition: A | Discharge status: Home / Self Care | Payer: Medicare Other | Attending: Emergency Medicine | Admitting: Emergency Medicine

## 2013-07-22 DIAGNOSIS — R1110 Vomiting, unspecified: Secondary | ICD-10-CM

## 2013-07-22 DIAGNOSIS — J4489 Other specified chronic obstructive pulmonary disease: Secondary | ICD-10-CM | POA: Insufficient documentation

## 2013-07-22 DIAGNOSIS — Z8551 Personal history of malignant neoplasm of bladder: Secondary | ICD-10-CM | POA: Insufficient documentation

## 2013-07-22 DIAGNOSIS — R5381 Other malaise: Secondary | ICD-10-CM | POA: Insufficient documentation

## 2013-07-22 DIAGNOSIS — Z87828 Personal history of other (healed) physical injury and trauma: Secondary | ICD-10-CM | POA: Insufficient documentation

## 2013-07-22 DIAGNOSIS — R42 Dizziness and giddiness: Secondary | ICD-10-CM | POA: Insufficient documentation

## 2013-07-22 DIAGNOSIS — F17200 Nicotine dependence, unspecified, uncomplicated: Secondary | ICD-10-CM | POA: Insufficient documentation

## 2013-07-22 DIAGNOSIS — Z8673 Personal history of transient ischemic attack (TIA), and cerebral infarction without residual deficits: Secondary | ICD-10-CM | POA: Insufficient documentation

## 2013-07-22 DIAGNOSIS — M549 Dorsalgia, unspecified: Secondary | ICD-10-CM | POA: Insufficient documentation

## 2013-07-22 DIAGNOSIS — R109 Unspecified abdominal pain: Secondary | ICD-10-CM | POA: Insufficient documentation

## 2013-07-22 DIAGNOSIS — Z7902 Long term (current) use of antithrombotics/antiplatelets: Secondary | ICD-10-CM | POA: Insufficient documentation

## 2013-07-22 DIAGNOSIS — Z7982 Long term (current) use of aspirin: Secondary | ICD-10-CM | POA: Insufficient documentation

## 2013-07-22 DIAGNOSIS — Z85038 Personal history of other malignant neoplasm of large intestine: Secondary | ICD-10-CM | POA: Insufficient documentation

## 2013-07-22 DIAGNOSIS — J449 Chronic obstructive pulmonary disease, unspecified: Secondary | ICD-10-CM | POA: Insufficient documentation

## 2013-07-22 DIAGNOSIS — E876 Hypokalemia: Secondary | ICD-10-CM | POA: Insufficient documentation

## 2013-07-22 DIAGNOSIS — R52 Pain, unspecified: Secondary | ICD-10-CM | POA: Insufficient documentation

## 2013-07-22 DIAGNOSIS — R112 Nausea with vomiting, unspecified: Secondary | ICD-10-CM | POA: Insufficient documentation

## 2013-07-22 DIAGNOSIS — R079 Chest pain, unspecified: Secondary | ICD-10-CM | POA: Insufficient documentation

## 2013-07-22 DIAGNOSIS — Z91040 Latex allergy status: Secondary | ICD-10-CM | POA: Insufficient documentation

## 2013-07-22 DIAGNOSIS — N200 Calculus of kidney: Secondary | ICD-10-CM | POA: Insufficient documentation

## 2013-07-22 DIAGNOSIS — R3989 Other symptoms and signs involving the genitourinary system: Secondary | ICD-10-CM | POA: Insufficient documentation

## 2013-07-22 LAB — LIPASE, BLOOD: Lipase: 25 U/L (ref 11–59)

## 2013-07-22 LAB — CBC WITH DIFFERENTIAL/PLATELET
Hemoglobin: 16.3 g/dL (ref 13.0–17.0)
Lymphocytes Relative: 9 % — ABNORMAL LOW (ref 12–46)
Lymphs Abs: 1.5 10*3/uL (ref 0.7–4.0)
MCH: 29.9 pg (ref 26.0–34.0)
Monocytes Relative: 5 % (ref 3–12)
Neutrophils Relative %: 86 % — ABNORMAL HIGH (ref 43–77)
Platelets: 249 10*3/uL (ref 150–400)
RBC: 5.45 MIL/uL (ref 4.22–5.81)
RDW: 13.3 % (ref 11.5–15.5)
WBC: 15.5 10*3/uL — ABNORMAL HIGH (ref 4.0–10.5)

## 2013-07-22 LAB — RAPID URINE DRUG SCREEN, HOSP PERFORMED
Amphetamines: NOT DETECTED
Benzodiazepines: NOT DETECTED
Opiates: NOT DETECTED

## 2013-07-22 LAB — URINALYSIS, ROUTINE W REFLEX MICROSCOPIC
Bilirubin Urine: NEGATIVE
Ketones, ur: NEGATIVE mg/dL
Nitrite: NEGATIVE
Protein, ur: 100 mg/dL — AB
Specific Gravity, Urine: 1.02 (ref 1.005–1.030)
Urobilinogen, UA: 1 mg/dL (ref 0.0–1.0)
pH: 7.5 (ref 5.0–8.0)

## 2013-07-22 LAB — COMPREHENSIVE METABOLIC PANEL
ALT: 11 U/L (ref 0–53)
Albumin: 3.9 g/dL (ref 3.5–5.2)
Alkaline Phosphatase: 81 U/L (ref 39–117)
BUN: 17 mg/dL (ref 6–23)
CO2: 28 mEq/L (ref 19–32)
Chloride: 98 mEq/L (ref 96–112)
Creatinine, Ser: 0.94 mg/dL (ref 0.50–1.35)
GFR calc Af Amer: >90 mL/min (ref >90)
GFR calc non Af Amer: >90 mL/min (ref >90)
Glucose, Bld: 166 mg/dL — ABNORMAL HIGH (ref 70–99)
Potassium: 3.1 mEq/L — ABNORMAL LOW (ref 3.5–5.1)
Sodium: 138 mEq/L (ref 135–145)
Total Bilirubin: 0.6 mg/dL (ref 0.3–1.2)

## 2013-07-22 LAB — URINE MICROSCOPIC-ADD ON

## 2013-07-22 LAB — ETHANOL: Alcohol, Ethyl (B): <11 mg/dL (ref 0–11)

## 2013-07-22 MED ORDER — ONDANSETRON HCL 4 MG/2ML IJ SOLN
4.0000 mg | Freq: Once | INTRAMUSCULAR | Status: AC
  Administered 2013-07-22: 4 mg via INTRAVENOUS
  Filled 2013-07-22: qty 2

## 2013-07-22 MED ORDER — SODIUM CHLORIDE 0.9 % IV SOLN: INTRAVENOUS | Status: DC

## 2013-07-22 MED ORDER — POTASSIUM CHLORIDE CRYS ER 20 MEQ PO TBCR
40.0000 meq | EXTENDED_RELEASE_TABLET | Freq: Once | ORAL | Status: AC
  Administered 2013-07-22: 40 meq via ORAL
  Filled 2013-07-22: qty 2

## 2013-07-22 MED ORDER — SODIUM CHLORIDE 0.9 % IV BOLUS (SEPSIS)
500.0000 mL | Freq: Once | INTRAVENOUS | Status: AC
  Administered 2013-07-22: 1000 mL via INTRAVENOUS

## 2013-07-22 MED ORDER — HYDROMORPHONE HCL PF 1 MG/ML IJ SOLN
1.0000 mg | Freq: Once | INTRAMUSCULAR | Status: AC
  Administered 2013-07-22: 1 mg via INTRAVENOUS
  Filled 2013-07-22: qty 1

## 2013-07-22 MED ORDER — OXYCODONE-ACETAMINOPHEN 5-325 MG PO TABS: 1.0000 | ORAL_TABLET | ORAL | Status: DC | PRN

## 2013-07-22 NOTE — ED Notes (Signed)
Pt states vomiting PTA, since arrival pt has been heaving at times, and coughing up phlegm. No vomiting noted.

## 2013-07-22 NOTE — ED Notes (Signed)
Pt back from CT and given an additional blanket and wet sponge for comfort. NT currently at bedside obtaining a urine specimen by I&O cath.

## 2013-07-22 NOTE — ED Notes (Signed)
Pt may have something to drink per Dr. Effie Shy, gave pt [place]
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
## 2013-07-22 NOTE — ED Notes (Signed)
Pt c/o pain to epigastric area intermittent x 2 days. . Vomiting makes pain worse. N/v since yesterday x 30 per pt. Mm moist. Pt c/o left lower back pain x 1 day, "feels like another kidney stone". Decreased urination. Nondiaphoretic. Slight grimacing noted. Pain worse with movement.

## 2013-07-23 ENCOUNTER — Ambulatory Visit (HOSPITAL_COMMUNITY)
Admission: RE | Admit: 2013-07-23 | Discharge: 2013-07-23 | Disposition: A | Discharge status: Home / Self Care | Payer: Medicare Other | Source: Ambulatory Visit | Attending: Urology | Admitting: Urology

## 2013-07-23 ENCOUNTER — Other Ambulatory Visit (HOSPITAL_COMMUNITY): Payer: Self-pay | Admitting: Urology

## 2013-07-23 DIAGNOSIS — N200 Calculus of kidney: Secondary | ICD-10-CM

## 2013-07-23 LAB — URINE CULTURE: Colony Count: NO GROWTH

## 2013-09-04 ENCOUNTER — Ambulatory Visit (HOSPITAL_COMMUNITY)
Admission: RE | Admit: 2013-09-04 | Discharge: 2013-09-04 | Disposition: A | Discharge status: Home / Self Care | Payer: Medicare Other | Source: Ambulatory Visit | Attending: Family Medicine | Admitting: Family Medicine

## 2013-09-04 ENCOUNTER — Other Ambulatory Visit (HOSPITAL_COMMUNITY): Payer: Self-pay | Admitting: Family Medicine

## 2013-09-04 DIAGNOSIS — M169 Osteoarthritis of hip, unspecified: Secondary | ICD-10-CM | POA: Insufficient documentation

## 2013-09-04 DIAGNOSIS — M25559 Pain in unspecified hip: Secondary | ICD-10-CM | POA: Insufficient documentation

## 2013-09-04 DIAGNOSIS — M1990 Unspecified osteoarthritis, unspecified site: Secondary | ICD-10-CM

## 2013-09-04 DIAGNOSIS — M545 Low back pain, unspecified: Secondary | ICD-10-CM | POA: Insufficient documentation

## 2013-09-04 DIAGNOSIS — M25569 Pain in unspecified knee: Secondary | ICD-10-CM | POA: Insufficient documentation

## 2013-09-04 DIAGNOSIS — M259 Joint disorder, unspecified: Secondary | ICD-10-CM | POA: Insufficient documentation

## 2013-09-04 DIAGNOSIS — M5137 Other intervertebral disc degeneration, lumbosacral region: Secondary | ICD-10-CM | POA: Insufficient documentation

## 2013-09-04 DIAGNOSIS — N200 Calculus of kidney: Secondary | ICD-10-CM | POA: Insufficient documentation

## 2013-09-04 DIAGNOSIS — M1610 Unilateral primary osteoarthritis, unspecified hip: Secondary | ICD-10-CM | POA: Insufficient documentation

## 2013-09-04 DIAGNOSIS — M51379 Other intervertebral disc degeneration, lumbosacral region without mention of lumbar back pain or lower extremity pain: Secondary | ICD-10-CM | POA: Insufficient documentation

## 2014-11-29 ENCOUNTER — Ambulatory Visit (INDEPENDENT_AMBULATORY_CARE_PROVIDER_SITE_OTHER): Payer: Medicare HMO | Admitting: Urology

## 2014-11-29 DIAGNOSIS — Z8551 Personal history of malignant neoplasm of bladder: Secondary | ICD-10-CM

## 2014-11-29 DIAGNOSIS — N200 Calculus of kidney: Secondary | ICD-10-CM

## 2014-12-03 ENCOUNTER — Other Ambulatory Visit: Payer: Self-pay | Admitting: Urology

## 2014-12-03 DIAGNOSIS — Z8551 Personal history of malignant neoplasm of bladder: Secondary | ICD-10-CM

## 2014-12-05 ENCOUNTER — Ambulatory Visit (HOSPITAL_COMMUNITY)
Admission: RE | Admit: 2014-12-05 | Discharge: 2014-12-05 | Disposition: A | Discharge status: Home / Self Care | Payer: Medicare HMO | Source: Ambulatory Visit | Attending: Urology | Admitting: Urology

## 2014-12-05 DIAGNOSIS — Z8551 Personal history of malignant neoplasm of bladder: Secondary | ICD-10-CM | POA: Insufficient documentation

## 2014-12-05 DIAGNOSIS — N200 Calculus of kidney: Secondary | ICD-10-CM | POA: Diagnosis not present

## 2014-12-05 MED ORDER — IOHEXOL 300 MG/ML  SOLN
150.0000 mL | Freq: Once | INTRAMUSCULAR | Status: AC | PRN
  Administered 2014-12-05: 150 mL via INTRAVENOUS

## 2014-12-05 MED ORDER — SODIUM CHLORIDE 0.9 % IJ SOLN
INTRAMUSCULAR | Status: AC
  Filled 2014-12-05: qty 36

## 2015-01-17 ENCOUNTER — Ambulatory Visit: Payer: Medicare HMO | Admitting: Urology

## 2015-04-15 ENCOUNTER — Encounter: Payer: Self-pay | Admitting: Internal Medicine

## 2015-06-20 ENCOUNTER — Ambulatory Visit (HOSPITAL_COMMUNITY)
Admission: RE | Admit: 2015-06-20 | Discharge: 2015-06-20 | Disposition: A | Discharge status: Home / Self Care | Payer: Medicare HMO | Source: Ambulatory Visit | Attending: Urology | Admitting: Urology

## 2015-06-20 ENCOUNTER — Other Ambulatory Visit: Payer: Self-pay | Admitting: Urology

## 2015-06-20 ENCOUNTER — Ambulatory Visit (INDEPENDENT_AMBULATORY_CARE_PROVIDER_SITE_OTHER): Payer: Medicare HMO | Admitting: Urology

## 2015-06-20 DIAGNOSIS — Z8551 Personal history of malignant neoplasm of bladder: Secondary | ICD-10-CM | POA: Diagnosis not present

## 2015-06-20 DIAGNOSIS — N200 Calculus of kidney: Secondary | ICD-10-CM

## 2015-07-09 ENCOUNTER — Emergency Department (HOSPITAL_COMMUNITY): Payer: Medicare HMO

## 2015-07-09 ENCOUNTER — Inpatient Hospital Stay (HOSPITAL_COMMUNITY)
Admission: EM | Admit: 2015-07-09 | Discharge: 2015-07-10 | DRG: 389 | Disposition: A | Discharge status: Home / Self Care | Payer: Medicare HMO | Attending: Family Medicine | Admitting: Family Medicine

## 2015-07-09 ENCOUNTER — Encounter (HOSPITAL_COMMUNITY): Payer: Self-pay | Admitting: Emergency Medicine

## 2015-07-09 DIAGNOSIS — Z8673 Personal history of transient ischemic attack (TIA), and cerebral infarction without residual deficits: Secondary | ICD-10-CM | POA: Diagnosis not present

## 2015-07-09 DIAGNOSIS — Z8551 Personal history of malignant neoplasm of bladder: Secondary | ICD-10-CM

## 2015-07-09 DIAGNOSIS — D72829 Elevated white blood cell count, unspecified: Secondary | ICD-10-CM | POA: Diagnosis present

## 2015-07-09 DIAGNOSIS — Z87442 Personal history of urinary calculi: Secondary | ICD-10-CM

## 2015-07-09 DIAGNOSIS — E872 Acidosis: Secondary | ICD-10-CM | POA: Diagnosis present

## 2015-07-09 DIAGNOSIS — K5669 Other intestinal obstruction: Secondary | ICD-10-CM

## 2015-07-09 DIAGNOSIS — R1110 Vomiting, unspecified: Secondary | ICD-10-CM | POA: Diagnosis present

## 2015-07-09 DIAGNOSIS — K5660 Unspecified intestinal obstruction: Principal | ICD-10-CM | POA: Diagnosis present

## 2015-07-09 DIAGNOSIS — J449 Chronic obstructive pulmonary disease, unspecified: Secondary | ICD-10-CM | POA: Diagnosis present

## 2015-07-09 DIAGNOSIS — Z85038 Personal history of other malignant neoplasm of large intestine: Secondary | ICD-10-CM

## 2015-07-09 DIAGNOSIS — F17210 Nicotine dependence, cigarettes, uncomplicated: Secondary | ICD-10-CM | POA: Diagnosis present

## 2015-07-09 DIAGNOSIS — K56609 Unspecified intestinal obstruction, unspecified as to partial versus complete obstruction: Secondary | ICD-10-CM | POA: Diagnosis present

## 2015-07-09 LAB — URINALYSIS, ROUTINE W REFLEX MICROSCOPIC
Glucose, UA: NEGATIVE mg/dL
Ketones, ur: NEGATIVE mg/dL
Leukocytes, UA: NEGATIVE
Nitrite: NEGATIVE
PH: 6.5 (ref 5.0–8.0)
Protein, ur: 100 mg/dL — AB
SPECIFIC GRAVITY, URINE: 1.025 (ref 1.005–1.030)
Urobilinogen, UA: 0.2 mg/dL (ref 0.0–1.0)

## 2015-07-09 LAB — COMPREHENSIVE METABOLIC PANEL
ALBUMIN: 4.4 g/dL (ref 3.5–5.0)
ALT: 17 U/L (ref 17–63)
AST: 19 U/L (ref 15–41)
Alkaline Phosphatase: 72 U/L (ref 38–126)
Anion gap: 10 (ref 5–15)
BILIRUBIN TOTAL: 0.8 mg/dL (ref 0.3–1.2)
BUN: 15 mg/dL (ref 6–20)
CALCIUM: 9.1 mg/dL (ref 8.9–10.3)
CO2: 20 mmol/L — ABNORMAL LOW (ref 22–32)
Chloride: 108 mmol/L (ref 101–111)
Creatinine, Ser: 0.99 mg/dL (ref 0.61–1.24)
GFR calc Af Amer: >60 mL/min (ref >60)
GFR calc non Af Amer: >60 mL/min (ref >60)
GLUCOSE: 150 mg/dL — AB (ref 65–99)
Potassium: 3.7 mmol/L (ref 3.5–5.1)
Sodium: 138 mmol/L (ref 135–145)
TOTAL PROTEIN: 7.9 g/dL (ref 6.5–8.1)

## 2015-07-09 LAB — CBC WITH DIFFERENTIAL/PLATELET
BASOS ABS: 0 10*3/uL (ref 0.0–0.1)
BASOS PCT: 0 %
Eosinophils Absolute: 0 10*3/uL (ref 0.0–0.7)
Eosinophils Relative: 0 %
HEMATOCRIT: 47.5 % (ref 39.0–52.0)
HEMOGLOBIN: 16.1 g/dL (ref 13.0–17.0)
Lymphocytes Relative: 8 %
Lymphs Abs: 1.1 10*3/uL (ref 0.7–4.0)
MCH: 30 pg (ref 26.0–34.0)
MCHC: 33.9 g/dL (ref 30.0–36.0)
MCV: 88.5 fL (ref 78.0–100.0)
MONOS PCT: 6 %
Monocytes Absolute: 0.8 10*3/uL (ref 0.1–1.0)
NEUTROS ABS: 12.3 10*3/uL — AB (ref 1.7–7.7)
NEUTROS PCT: 86 %
Platelets: 227 10*3/uL (ref 150–400)
RBC: 5.37 MIL/uL (ref 4.22–5.81)
RDW: 13.5 % (ref 11.5–15.5)
WBC: 14.2 10*3/uL — ABNORMAL HIGH (ref 4.0–10.5)

## 2015-07-09 LAB — URINE MICROSCOPIC-ADD ON

## 2015-07-09 LAB — I-STAT CG4 LACTIC ACID, ED: LACTIC ACID, VENOUS: 2.03 mmol/L — AB (ref 0.5–2.0)

## 2015-07-09 LAB — LIPASE, BLOOD: Lipase: 27 U/L (ref 11–51)

## 2015-07-09 LAB — POC OCCULT BLOOD, ED: Fecal Occult Bld: POSITIVE — AB

## 2015-07-09 MED ORDER — NITROGLYCERIN 0.4 MG SL SUBL: 0.4000 mg | SUBLINGUAL_TABLET | SUBLINGUAL | Status: DC | PRN

## 2015-07-09 MED ORDER — SODIUM CHLORIDE 0.9 % IV SOLN
Freq: Once | INTRAVENOUS | Status: AC
  Administered 2015-07-09: 15:00:00 via INTRAVENOUS

## 2015-07-09 MED ORDER — ONDANSETRON HCL 4 MG/2ML IJ SOLN
4.0000 mg | Freq: Once | INTRAMUSCULAR | Status: AC
  Administered 2015-07-09: 4 mg via INTRAVENOUS
  Filled 2015-07-09: qty 2

## 2015-07-09 MED ORDER — IOHEXOL 300 MG/ML  SOLN
50.0000 mL | Freq: Once | INTRAMUSCULAR | Status: AC | PRN
  Administered 2015-07-09: 50 mL via ORAL

## 2015-07-09 MED ORDER — SODIUM CHLORIDE 0.9 % IV SOLN: INTRAVENOUS | Status: DC

## 2015-07-09 MED ORDER — MORPHINE SULFATE (PF) 2 MG/ML IV SOLN: 2.0000 mg | INTRAVENOUS | Status: DC | PRN

## 2015-07-09 MED ORDER — ALBUTEROL SULFATE (2.5 MG/3ML) 0.083% IN NEBU: 2.5000 mg | INHALATION_SOLUTION | Freq: Four times a day (QID) | RESPIRATORY_TRACT | Status: DC | PRN

## 2015-07-09 MED ORDER — IOHEXOL 300 MG/ML  SOLN
100.0000 mL | Freq: Once | INTRAMUSCULAR | Status: AC | PRN
  Administered 2015-07-09: 100 mL via INTRAVENOUS

## 2015-07-09 MED ORDER — ONDANSETRON HCL 4 MG PO TABS: 4.0000 mg | ORAL_TABLET | Freq: Four times a day (QID) | ORAL | Status: DC | PRN

## 2015-07-09 MED ORDER — ALBUTEROL SULFATE HFA 108 (90 BASE) MCG/ACT IN AERS
2.0000 | INHALATION_SPRAY | Freq: Four times a day (QID) | RESPIRATORY_TRACT | Status: DC | PRN
  Filled 2015-07-09: qty 6.7

## 2015-07-09 MED ORDER — SODIUM CHLORIDE 0.9 % IV SOLN: Freq: Once | INTRAVENOUS | Status: DC

## 2015-07-09 MED ORDER — LORAZEPAM 2 MG/ML IJ SOLN
0.5000 mg | Freq: Once | INTRAMUSCULAR | Status: AC
  Administered 2015-07-09: 0.5 mg via INTRAVENOUS
  Filled 2015-07-09: qty 1

## 2015-07-09 MED ORDER — POTASSIUM CHLORIDE IN NACL 20-0.9 MEQ/L-% IV SOLN
INTRAVENOUS | Status: DC
  Administered 2015-07-09: 20:00:00 via INTRAVENOUS

## 2015-07-09 MED ORDER — ONDANSETRON HCL 4 MG/2ML IJ SOLN: 4.0000 mg | Freq: Four times a day (QID) | INTRAMUSCULAR | Status: DC | PRN

## 2015-07-09 MED ORDER — MORPHINE SULFATE (PF) 4 MG/ML IV SOLN
4.0000 mg | Freq: Once | INTRAVENOUS | Status: AC
  Administered 2015-07-09: 4 mg via INTRAVENOUS
  Filled 2015-07-09: qty 1
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
## 2015-07-09 NOTE — ED Notes (Signed)
Pt states that he woke up with severe abd pain at 0330 this morning with diarrhea and vomiting.  Has been having severe diarrhea all day and now just pain.

## 2015-07-10 LAB — CBC
HCT: 43.4 % (ref 39.0–52.0)
Hemoglobin: 14.2 g/dL (ref 13.0–17.0)
MCH: 29.2 pg (ref 26.0–34.0)
MCHC: 32.7 g/dL (ref 30.0–36.0)
MCV: 89.3 fL (ref 78.0–100.0)
PLATELETS: 196 10*3/uL (ref 150–400)
RBC: 4.86 MIL/uL (ref 4.22–5.81)
RDW: 13.4 % (ref 11.5–15.5)
WBC: 9.5 10*3/uL (ref 4.0–10.5)

## 2015-07-10 LAB — COMPREHENSIVE METABOLIC PANEL
ALT: 15 U/L — ABNORMAL LOW (ref 17–63)
AST: 16 U/L (ref 15–41)
Albumin: 3.4 g/dL — ABNORMAL LOW (ref 3.5–5.0)
Alkaline Phosphatase: 55 U/L (ref 38–126)
Anion gap: 6 (ref 5–15)
BILIRUBIN TOTAL: 1 mg/dL (ref 0.3–1.2)
BUN: 15 mg/dL (ref 6–20)
CALCIUM: 8.3 mg/dL — AB (ref 8.9–10.3)
CO2: 25 mmol/L (ref 22–32)
CREATININE: 0.92 mg/dL (ref 0.61–1.24)
Chloride: 109 mmol/L (ref 101–111)
GFR calc Af Amer: >60 mL/min (ref >60)
Glucose, Bld: 145 mg/dL — ABNORMAL HIGH (ref 65–99)
POTASSIUM: 3.4 mmol/L — AB (ref 3.5–5.1)
Sodium: 140 mmol/L (ref 135–145)
TOTAL PROTEIN: 6.6 g/dL (ref 6.5–8.1)
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
## 2015-07-10 NOTE — Care Management Note (Signed)
Case Management Note  Patient Details  Name: Craig Owens MRN: [id number] Date of Birth: 1961/08/11  Subjective/Objective:                   Pt is from home and ind with ADL's. Pt admitted for partial SBO.   Action/Plan: Pt discharging home today with self care. No CM needs.   Expected Discharge Date:  07/13/15               Expected Discharge Plan:  Home/Self Care  In-House Referral:  NA  Discharge planning Services  CM Consult  Post Acute Care Choice:  NA Choice offered to:  NA  DME Arranged:    DME Agency:     HH Arranged:    HH Agency:     Status of Service:  Completed, signed off  Medicare Important Message Given:    Date Medicare IM Given:    Medicare IM give by:    Date Additional Medicare IM Given:    Additional Medicare Important Message give by:     If discussed at [place] of Stay Meetings, dates discussed:    Additional Comments:  Sherald Barge, RN 07/10/2015, 2:44 PM
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
## 2015-07-11 ENCOUNTER — Ambulatory Visit (INDEPENDENT_AMBULATORY_CARE_PROVIDER_SITE_OTHER): Payer: Medicare HMO | Admitting: Urology

## 2015-07-11 DIAGNOSIS — Z8551 Personal history of malignant neoplasm of bladder: Secondary | ICD-10-CM | POA: Diagnosis not present

## 2015-07-11 DIAGNOSIS — N200 Calculus of kidney: Secondary | ICD-10-CM

## 2015-08-02 ENCOUNTER — Encounter (HOSPITAL_COMMUNITY): Payer: Self-pay | Admitting: *Deleted

## 2015-08-02 ENCOUNTER — Emergency Department (HOSPITAL_COMMUNITY)
Admission: EM | Admit: 2015-08-02 | Discharge: 2015-08-02 | Disposition: A | Discharge status: Home / Self Care | Payer: Medicare HMO | Attending: Emergency Medicine | Admitting: Emergency Medicine

## 2015-08-02 DIAGNOSIS — Y998 Other external cause status: Secondary | ICD-10-CM | POA: Diagnosis not present

## 2015-08-02 DIAGNOSIS — Z87442 Personal history of urinary calculi: Secondary | ICD-10-CM | POA: Insufficient documentation

## 2015-08-02 DIAGNOSIS — Z79899 Other long term (current) drug therapy: Secondary | ICD-10-CM | POA: Insufficient documentation

## 2015-08-02 DIAGNOSIS — Z91040 Latex allergy status: Secondary | ICD-10-CM | POA: Insufficient documentation

## 2015-08-02 DIAGNOSIS — Z85038 Personal history of other malignant neoplasm of large intestine: Secondary | ICD-10-CM | POA: Insufficient documentation

## 2015-08-02 DIAGNOSIS — Z8551 Personal history of malignant neoplasm of bladder: Secondary | ICD-10-CM | POA: Insufficient documentation

## 2015-08-02 DIAGNOSIS — J449 Chronic obstructive pulmonary disease, unspecified: Secondary | ICD-10-CM | POA: Insufficient documentation

## 2015-08-02 DIAGNOSIS — Z7902 Long term (current) use of antithrombotics/antiplatelets: Secondary | ICD-10-CM | POA: Diagnosis not present

## 2015-08-02 DIAGNOSIS — W540XXA Bitten by dog, initial encounter: Secondary | ICD-10-CM | POA: Insufficient documentation

## 2015-08-02 DIAGNOSIS — Z23 Encounter for immunization: Secondary | ICD-10-CM | POA: Insufficient documentation

## 2015-08-02 DIAGNOSIS — Y9389 Activity, other specified: Secondary | ICD-10-CM | POA: Insufficient documentation

## 2015-08-02 DIAGNOSIS — S61452A Open bite of left hand, initial encounter: Secondary | ICD-10-CM | POA: Diagnosis present

## 2015-08-02 DIAGNOSIS — Y9289 Other specified places as the place of occurrence of the external cause: Secondary | ICD-10-CM | POA: Insufficient documentation

## 2015-08-02 DIAGNOSIS — S61432A Puncture wound without foreign body of left hand, initial encounter: Secondary | ICD-10-CM | POA: Diagnosis not present

## 2015-08-02 DIAGNOSIS — Z7982 Long term (current) use of aspirin: Secondary | ICD-10-CM | POA: Insufficient documentation

## 2015-08-02 DIAGNOSIS — Z8673 Personal history of transient ischemic attack (TIA), and cerebral infarction without residual deficits: Secondary | ICD-10-CM | POA: Insufficient documentation

## 2015-08-02 DIAGNOSIS — F17210 Nicotine dependence, cigarettes, uncomplicated: Secondary | ICD-10-CM | POA: Diagnosis not present

## 2015-08-02 DIAGNOSIS — Z792 Long term (current) use of antibiotics: Secondary | ICD-10-CM | POA: Diagnosis not present

## 2015-08-02 MED ORDER — TETANUS-DIPHTH-ACELL PERTUSSIS 5-2.5-18.5 LF-MCG/0.5 IM SUSP
0.5000 mL | Freq: Once | INTRAMUSCULAR | Status: AC
  Administered 2015-08-02: 0.5 mL via INTRAMUSCULAR
  Filled 2015-08-02: qty 0.5

## 2015-08-02 MED ORDER — AMOXICILLIN-POT CLAVULANATE 875-125 MG PO TABS: 1.0000 | ORAL_TABLET | Freq: Two times a day (BID) | ORAL | Status: DC

## 2015-08-02 NOTE — ED Notes (Signed)
[employer] called concerning sending an officer to speak with pt concerning dog bite
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
## 2015-08-02 NOTE — ED Notes (Signed)
MD at bedside. 

## 2015-08-02 NOTE — Discharge Instructions (Signed)

## 2015-08-02 NOTE — ED Notes (Signed)
Pt was bitten to left hand while trying to separate 3 dogs. Several puncture wounds to left hand. States his dogs rabies vaccine is up to date. Pt states he just wants a tetanus shot.

## 2015-10-28 ENCOUNTER — Other Ambulatory Visit: Payer: Self-pay | Admitting: Urology

## 2015-10-28 ENCOUNTER — Ambulatory Visit (HOSPITAL_COMMUNITY)
Admission: RE | Admit: 2015-10-28 | Discharge: 2015-10-28 | Disposition: A | Discharge status: Home / Self Care | Payer: Medicare HMO | Source: Ambulatory Visit | Attending: Urology | Admitting: Urology

## 2015-10-28 DIAGNOSIS — N200 Calculus of kidney: Secondary | ICD-10-CM | POA: Diagnosis present

## 2015-12-18 ENCOUNTER — Other Ambulatory Visit (HOSPITAL_COMMUNITY): Payer: Self-pay | Admitting: Family Medicine

## 2015-12-18 ENCOUNTER — Ambulatory Visit (HOSPITAL_COMMUNITY)
Admission: RE | Admit: 2015-12-18 | Discharge: 2015-12-18 | Disposition: A | Discharge status: Home / Self Care | Payer: Medicare HMO | Source: Ambulatory Visit | Attending: Family Medicine | Admitting: Family Medicine

## 2015-12-18 DIAGNOSIS — M479 Spondylosis, unspecified: Secondary | ICD-10-CM

## 2015-12-18 DIAGNOSIS — M1711 Unilateral primary osteoarthritis, right knee: Secondary | ICD-10-CM

## 2015-12-18 DIAGNOSIS — I714 Abdominal aortic aneurysm, without rupture: Secondary | ICD-10-CM | POA: Insufficient documentation

## 2015-12-18 DIAGNOSIS — N200 Calculus of kidney: Secondary | ICD-10-CM | POA: Diagnosis not present

## 2015-12-18 DIAGNOSIS — M1712 Unilateral primary osteoarthritis, left knee: Secondary | ICD-10-CM

## 2015-12-18 DIAGNOSIS — M5116 Intervertebral disc disorders with radiculopathy, lumbar region: Secondary | ICD-10-CM | POA: Diagnosis not present

## 2015-12-18 DIAGNOSIS — M179 Osteoarthritis of knee, unspecified: Secondary | ICD-10-CM | POA: Diagnosis present

## 2015-12-19 ENCOUNTER — Ambulatory Visit (INDEPENDENT_AMBULATORY_CARE_PROVIDER_SITE_OTHER): Payer: Medicare HMO | Admitting: Urology

## 2015-12-19 DIAGNOSIS — N200 Calculus of kidney: Secondary | ICD-10-CM | POA: Diagnosis not present

## 2016-01-05 ENCOUNTER — Other Ambulatory Visit: Payer: Self-pay | Admitting: Urology

## 2016-01-06 ENCOUNTER — Other Ambulatory Visit: Payer: Self-pay | Admitting: Urology

## 2016-01-06 DIAGNOSIS — N200 Calculus of kidney: Secondary | ICD-10-CM

## 2016-02-05 ENCOUNTER — Encounter (HOSPITAL_COMMUNITY)
Admission: RE | Admit: 2016-02-05 | Discharge: 2016-02-05 | Disposition: A | Discharge status: Home / Self Care | Payer: Medicare HMO | Source: Ambulatory Visit | Attending: Urology | Admitting: Urology

## 2016-02-05 ENCOUNTER — Encounter (HOSPITAL_COMMUNITY): Payer: Self-pay

## 2016-02-05 DIAGNOSIS — N200 Calculus of kidney: Secondary | ICD-10-CM | POA: Insufficient documentation

## 2016-02-05 DIAGNOSIS — Z01812 Encounter for preprocedural laboratory examination: Secondary | ICD-10-CM | POA: Insufficient documentation

## 2016-02-05 DIAGNOSIS — Z0183 Encounter for blood typing: Secondary | ICD-10-CM | POA: Diagnosis not present

## 2016-02-05 HISTORY — DX: Other specified personal risk factors, not elsewhere classified: Z91.89

## 2016-02-05 LAB — BASIC METABOLIC PANEL
Anion gap: 9 (ref 5–15)
BUN: 20 mg/dL (ref 6–20)
CHLORIDE: 107 mmol/L (ref 101–111)
CO2: 22 mmol/L (ref 22–32)
CREATININE: 1.1 mg/dL (ref 0.61–1.24)
Calcium: 9.5 mg/dL (ref 8.9–10.3)
GFR calc non Af Amer: >60 mL/min (ref >60)
GLUCOSE: 197 mg/dL — AB (ref 65–99)
Potassium: 4.5 mmol/L (ref 3.5–5.1)
Sodium: 138 mmol/L (ref 135–145)

## 2016-02-05 LAB — CBC
HCT: 44 % (ref 39.0–52.0)
Hemoglobin: 15 g/dL (ref 13.0–17.0)
MCH: 29.4 pg (ref 26.0–34.0)
MCHC: 34.1 g/dL (ref 30.0–36.0)
MCV: 86.3 fL (ref 78.0–100.0)
PLATELETS: 224 10*3/uL (ref 150–400)
RBC: 5.1 MIL/uL (ref 4.22–5.81)
RDW: 13.2 % (ref 11.5–15.5)
WBC: 10.9 10*3/uL — ABNORMAL HIGH (ref 4.0–10.5)

## 2016-02-05 NOTE — Pre-Procedure Instructions (Signed)
02-05-16 EKG 10'16 Epic. Clearance note  Dr. Cindie Laroche [phone] with chart.

## 2016-02-05 NOTE — Progress Notes (Signed)
02-05-16 1545 Pt has open "quartersize" wound -upper abdomen midline- -no drainage- no bandage at present. States"it comes and goes".

## 2016-02-05 NOTE — Patient Instructions (Signed)
DAXON KYNE  02/05/2016   Your procedure is scheduled on: 02-10-16 Check in with Radiology Dept first.  Report to [facility]  Entrance take [facility]  elevators to 3rd floor to  [place] at  0730 AM.  Call this number if you have problems the morning of surgery [phone]   Remember: ONLY 1 PERSON MAY GO WITH YOU TO SHORT STAY TO GET  READY MORNING OF [place].  Do not eat food or drink liquids :After Midnight.     Take these medicines the morning of surgery with A SIP OF WATER: Oxycodone- if need. DO NOT TAKE ANY DIABETIC MEDICATIONS DAY OF YOUR SURGERY                               You may not have any metal on your body including hair pins and              piercings  Do not wear jewelry, make-up, lotions, powders or perfumes, deodorant             Do not wear nail polish.  Do not shave  48 hours prior to surgery.              Men may shave face and neck.   Do not bring valuables to the hospital. [place].  Contacts, dentures or bridgework may not be worn into surgery.  Leave suitcase in the car. After surgery it may be brought to your room.     Patients discharged the day of surgery will not be allowed to drive home.  Name and phone number of your driver: possible mother Tonita Phoenix - [phone] cell  Special Instructions: N/A              Please read over the following fact sheets you were given: _____________________________________________________________________             [facility] - Preparing for Surgery Before surgery, you can play an important role.  Because skin is not sterile, your skin needs to be as free of germs as possible.  You can reduce the number of germs on your skin by washing with CHG (chlorahexidine gluconate) soap before surgery.  CHG is an antiseptic cleaner which kills germs and bonds with the skin to continue killing germs even after washing. Please DO  NOT use if you have an allergy to CHG or antibacterial soaps.  If your skin becomes reddened/irritated stop using the CHG and inform your nurse when you arrive at Short Stay. Do not shave (including legs and underarms) for at least 48 hours prior to the first CHG shower.  You may shave your face/neck. Please follow these instructions carefully:  1.  Shower with CHG Soap the night before surgery and the  morning of Surgery.  2.  If you choose to wash your hair, wash your hair first as usual with your  normal  shampoo.  3.  After you shampoo, rinse your hair and body thoroughly to remove the  shampoo.                           4.  Use CHG as you would any  other liquid soap.  You can apply chg directly  to the skin and wash                       Gently with a scrungie or clean washcloth.  5.  Apply the CHG Soap to your body ONLY FROM THE NECK DOWN.   Do not use on face/ open                           Wound or open sores. Avoid contact with eyes, ears mouth and genitals (private parts).                       Wash face,  Genitals (private parts) with your normal soap.             6.  Wash thoroughly, paying special attention to the area where your surgery  will be performed.  7.  Thoroughly rinse your body with warm water from the neck down.  8.  DO NOT shower/wash with your normal soap after using and rinsing off  the CHG Soap.                9.  Pat yourself dry with a clean towel.            10.  Wear clean pajamas.            11.  Place clean sheets on your bed the night of your first shower and do not  sleep with pets. Day of Surgery : Do not apply any lotions/deodorants the morning of surgery.  Please wear clean clothes to the hospital/surgery center.  FAILURE TO FOLLOW THESE INSTRUCTIONS MAY RESULT IN THE CANCELLATION OF YOUR SURGERY PATIENT SIGNATURE_________________________________  NURSE  SIGNATURE__________________________________  ________________________________________________________________________

## 2016-02-05 NOTE — Progress Notes (Signed)
02-05-16 1545 Patient has blood antibodies- will require redraw of blood sample AM of preop on arrival -use same blue band number.Marland Kitchen

## 2016-02-06 ENCOUNTER — Other Ambulatory Visit: Payer: Self-pay | Admitting: General Surgery

## 2016-02-06 ENCOUNTER — Other Ambulatory Visit: Payer: Self-pay | Admitting: Radiology

## 2016-02-06 ENCOUNTER — Ambulatory Visit (INDEPENDENT_AMBULATORY_CARE_PROVIDER_SITE_OTHER): Payer: Medicare HMO | Admitting: Urology

## 2016-02-06 DIAGNOSIS — N200 Calculus of kidney: Secondary | ICD-10-CM

## 2016-02-09 MED ORDER — DEXTROSE 5 % IV SOLN
3.0000 g | INTRAVENOUS | Status: DC
  Filled 2016-02-09: qty 3000

## 2016-02-10 ENCOUNTER — Encounter (HOSPITAL_COMMUNITY): Payer: Self-pay | Admitting: Anesthesiology

## 2016-02-10 ENCOUNTER — Ambulatory Visit (HOSPITAL_COMMUNITY)
Admission: RE | Admit: 2016-02-10 | Discharge: 2016-02-10 | Disposition: A | Discharge status: Home / Self Care | Payer: Medicare HMO | Source: Ambulatory Visit | Attending: Urology | Admitting: Urology

## 2016-02-10 ENCOUNTER — Encounter (HOSPITAL_COMMUNITY)
Admission: RE | Disposition: A | Discharge status: Home / Self Care | Payer: Self-pay | Source: Ambulatory Visit | Attending: Urology

## 2016-02-10 ENCOUNTER — Encounter (HOSPITAL_COMMUNITY): Payer: Self-pay | Admitting: *Deleted

## 2016-02-10 DIAGNOSIS — N200 Calculus of kidney: Secondary | ICD-10-CM | POA: Diagnosis not present

## 2016-02-10 DIAGNOSIS — Z85038 Personal history of other malignant neoplasm of large intestine: Secondary | ICD-10-CM | POA: Diagnosis not present

## 2016-02-10 DIAGNOSIS — Z933 Colostomy status: Secondary | ICD-10-CM | POA: Insufficient documentation

## 2016-02-10 DIAGNOSIS — J45909 Unspecified asthma, uncomplicated: Secondary | ICD-10-CM | POA: Insufficient documentation

## 2016-02-10 DIAGNOSIS — Z7902 Long term (current) use of antithrombotics/antiplatelets: Secondary | ICD-10-CM | POA: Diagnosis not present

## 2016-02-10 DIAGNOSIS — Z5309 Procedure and treatment not carried out because of other contraindication: Secondary | ICD-10-CM | POA: Diagnosis not present

## 2016-02-10 DIAGNOSIS — F17200 Nicotine dependence, unspecified, uncomplicated: Secondary | ICD-10-CM | POA: Insufficient documentation

## 2016-02-10 DIAGNOSIS — I2510 Atherosclerotic heart disease of native coronary artery without angina pectoris: Secondary | ICD-10-CM | POA: Insufficient documentation

## 2016-02-10 DIAGNOSIS — R109 Unspecified abdominal pain: Secondary | ICD-10-CM | POA: Diagnosis present

## 2016-02-10 DIAGNOSIS — Z8551 Personal history of malignant neoplasm of bladder: Secondary | ICD-10-CM | POA: Diagnosis not present

## 2016-02-10 DIAGNOSIS — Z8673 Personal history of transient ischemic attack (TIA), and cerebral infarction without residual deficits: Secondary | ICD-10-CM | POA: Diagnosis not present

## 2016-02-10 DIAGNOSIS — Z79891 Long term (current) use of opiate analgesic: Secondary | ICD-10-CM | POA: Insufficient documentation

## 2016-02-10 DIAGNOSIS — Z809 Family history of malignant neoplasm, unspecified: Secondary | ICD-10-CM | POA: Diagnosis not present

## 2016-02-10 DIAGNOSIS — N390 Urinary tract infection, site not specified: Secondary | ICD-10-CM | POA: Diagnosis not present

## 2016-02-10 DIAGNOSIS — Z79899 Other long term (current) drug therapy: Secondary | ICD-10-CM | POA: Insufficient documentation

## 2016-02-10 DIAGNOSIS — Z7982 Long term (current) use of aspirin: Secondary | ICD-10-CM | POA: Insufficient documentation

## 2016-02-10 LAB — TYPE AND SCREEN
ABO/RH(D): A POS
ANTIBODY SCREEN: POSITIVE
DAT, IgG: NEGATIVE
PT AG TYPE: NEGATIVE

## 2016-02-10 LAB — BASIC METABOLIC PANEL
Anion gap: 8 (ref 5–15)
BUN: 20 mg/dL (ref 6–20)
CHLORIDE: 109 mmol/L (ref 101–111)
CO2: 21 mmol/L — AB (ref 22–32)
Calcium: 9.3 mg/dL (ref 8.9–10.3)
Creatinine, Ser: 1.12 mg/dL (ref 0.61–1.24)
GLUCOSE: 164 mg/dL — AB (ref 65–99)
Potassium: 4 mmol/L (ref 3.5–5.1)
Sodium: 138 mmol/L (ref 135–145)

## 2016-02-10 LAB — CBC
HEMATOCRIT: 44.8 % (ref 39.0–52.0)
HEMOGLOBIN: 14.7 g/dL (ref 13.0–17.0)
MCH: 28.8 pg (ref 26.0–34.0)
MCHC: 32.8 g/dL (ref 30.0–36.0)
MCV: 87.8 fL (ref 78.0–100.0)
Platelets: 229 10*3/uL (ref 150–400)
RBC: 5.1 MIL/uL (ref 4.22–5.81)
RDW: 13.3 % (ref 11.5–15.5)
WBC: 9.9 10*3/uL (ref 4.0–10.5)

## 2016-02-10 LAB — PROTIME-INR
INR: 1.03 (ref 0.00–1.49)
Prothrombin Time: 13.7 seconds (ref 11.6–15.2)

## 2016-02-10 LAB — APTT: aPTT: 28 seconds (ref 24–37)

## 2016-02-10 SURGERY — NEPHROLITHOTOMY PERCUTANEOUS
Anesthesia: General | Laterality: Left

## 2016-02-10 MED ORDER — LIDOCAINE HCL 1 % IJ SOLN
INTRAMUSCULAR | Status: AC
  Filled 2016-02-10: qty 20

## 2016-02-10 MED ORDER — CIPROFLOXACIN IN D5W 400 MG/200ML IV SOLN
400.0000 mg | INTRAVENOUS | Status: DC
  Filled 2016-02-10: qty 200

## 2016-02-10 MED ORDER — SODIUM CHLORIDE 0.9 % IV SOLN: INTRAVENOUS | Status: DC

## 2016-02-10 MED ORDER — CIPROFLOXACIN IN D5W 400 MG/200ML IV SOLN
INTRAVENOUS | Status: AC
  Filled 2016-02-10: qty 200

## 2016-02-10 MED ORDER — LACTATED RINGERS IV SOLN
INTRAVENOUS | Status: DC
  Administered 2016-02-10: 09:00:00 via INTRAVENOUS

## 2016-02-10 MED ORDER — MIDAZOLAM HCL 2 MG/2ML IJ SOLN
INTRAMUSCULAR | Status: AC
  Filled 2016-02-10: qty 6

## 2016-02-10 MED ORDER — NITROGLYCERIN 0.4 MG SL SUBL: 0.4000 mg | SUBLINGUAL_TABLET | SUBLINGUAL | Status: DC | PRN

## 2016-02-10 MED ORDER — FENTANYL CITRATE (PF) 100 MCG/2ML IJ SOLN
INTRAMUSCULAR | Status: AC
  Filled 2016-02-10: qty 4

## 2016-02-10 NOTE — Anesthesia Preprocedure Evaluation (Deleted)
Anesthesia Evaluation  Patient identified by MRN, date of birth, ID band Patient awake    Reviewed: Allergy & Precautions, H&P , NPO status , Patient's Chart, lab work & pertinent test results  History of Anesthesia Complications Negative for: history of anesthetic complications  Airway Mallampati: II  TM Distance: >3 FB Neck ROM: full    Dental no notable dental hx.    Pulmonary COPD, Current Smoker,    Pulmonary exam normal breath sounds clear to auscultation       Cardiovascular negative cardio ROS Normal cardiovascular exam Rhythm:regular Rate:Normal     Neuro/Psych CVA    GI/Hepatic Neg liver ROS, GERD  ,  Endo/Other  negative endocrine ROS  Renal/GU negative Renal ROS     Musculoskeletal   Abdominal   Peds  Hematology negative hematology ROS (+)   Anesthesia Other Findings Case is canceled for 02/10/16.Marland Kitchen He had episode of what appears to be classic angina while working in yard yesterday, squeezing in chest, diaphoresis, etc. That resolved with rest. Will pursue cardiology work up prior to nephrolithotomy  Reproductive/Obstetrics negative OB ROS                            Anesthesia Physical Anesthesia Plan  ASA: III  Anesthesia Plan: General   Post-op Pain Management:    Induction: Intravenous  Airway Management Planned: Oral ETT  Additional Equipment:   Intra-op Plan:   Post-operative Plan: Extubation in OR  Informed Consent: I have reviewed the patients History and Physical, chart, labs and discussed the procedure including the risks, benefits and alternatives for the proposed anesthesia with the patient or authorized representative who has indicated his/her understanding and acceptance.   Dental Advisory Given  Plan Discussed with: Anesthesiologist, CRNA and Surgeon  Anesthesia Plan Comments:         Anesthesia Quick Evaluation

## 2016-02-10 NOTE — Interval H&P Note (Signed)
History and Physical Interval Note:  [name] had severe CP for about 15 minutes yesterday and anesthesia has requested he be cleared by cardiology.   I have discussed this with cardiology and will resume his plavix and get him in for clearance.  His procedures this week were cancelled and will be rescheduled with the clearance is complete.   02/10/2016 10:00 AM  Craig Owens  has presented today for surgery, with the diagnosis of LEFT RENAL STONE  The various methods of treatment have been discussed with the patient and family. After consideration of risks, benefits and other options for treatment, the patient has consented to  Procedure(s): LEFT FIRST STAGE PERCUTANEOUS NEPHROLITHOTOMY  (Left) HOLMIUM LASER APPLICATION (Left) as a surgical intervention .  The patient's history has been reviewed, patient examined, no change in status, stable for surgery.  I have reviewed the patient's chart and labs.  Questions were answered to the patient's satisfaction.     Mailyn Steichen J

## 2016-02-10 NOTE — Progress Notes (Signed)
Patient ID: Craig Owens, male   DOB: [birth year], 55 y.o.   MRN: [id number] Patient was evaluated by myself and in ROS the patient stated he was working in the yard yesterday and started having bad chest pain right as he stopped working.  It lasted for about 15-30 minutes.  He broke out into a bad sweat, denies nausea, but had squeezing tight chest pain.  He felt like someone was sitting on his chest.  He has a h/o CVA and has been off his plavix for 7 days.  He almost came to the ED for evaluation because the pain was bad enough.  Once the pain resolved, he then developed a horrible headache for the rest of the day.  I have d/w Dr. Barbie Banner, Dr. Jeffie Pollock, and Dr. Jillyn Hidden.  We all feel like this is possible angina and that he needs to be evaluated by a cardiologist prior to proceeding with any type of intervention.  His procedures will be cancelled for today pending further work up.  Craig Owens E 9:32 AM 02/10/2016

## 2016-02-10 NOTE — Progress Notes (Signed)
Iv site d/c, pressure dressing applied as ordered by PA Claiborne Billings.  Encouraged pt to stay as long as possible to get his dates to come back.  Pt states he might stay about 30 minutes but no promises.
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
## 2016-02-10 NOTE — Progress Notes (Addendum)
Dr. Jeffie Pollock at bedside and spoke with pt.  Pt's procedures were canceled today due to pt stating he had chest pain yesterday.  Dr. Jeffie Pollock states okay for pt to leave.  Pt given follow up appointment for cardiac appointment tomorrow and also told he has nitroglycerin at his [place] to be picked up.  Pt voiced understanding.  Dr. Jeffie Pollock states pt can restart Plavix today.  Pt was informed of this.  Pt d/c ambulatory to lobby to wait on his ride.  No sedation meds given.

## 2016-02-11 ENCOUNTER — Encounter: Payer: Self-pay | Admitting: Nurse Practitioner

## 2016-02-11 ENCOUNTER — Ambulatory Visit (INDEPENDENT_AMBULATORY_CARE_PROVIDER_SITE_OTHER): Payer: Medicare HMO | Admitting: Nurse Practitioner

## 2016-02-11 ENCOUNTER — Ambulatory Visit
Admission: RE | Admit: 2016-02-11 | Discharge: 2016-02-11 | Disposition: A | Discharge status: Home / Self Care | Payer: Medicare HMO | Source: Ambulatory Visit | Attending: Nurse Practitioner | Admitting: Nurse Practitioner

## 2016-02-11 VITALS — BP 100/78 | HR 83 | Ht 73.0 in | Wt 269.4 lb

## 2016-02-11 DIAGNOSIS — F1210 Cannabis abuse, uncomplicated: Secondary | ICD-10-CM

## 2016-02-11 DIAGNOSIS — Z720 Tobacco use: Secondary | ICD-10-CM

## 2016-02-11 DIAGNOSIS — I200 Unstable angina: Secondary | ICD-10-CM

## 2016-02-11 DIAGNOSIS — N200 Calculus of kidney: Secondary | ICD-10-CM

## 2016-02-11 DIAGNOSIS — E785 Hyperlipidemia, unspecified: Secondary | ICD-10-CM

## 2016-02-11 DIAGNOSIS — E669 Obesity, unspecified: Secondary | ICD-10-CM | POA: Insufficient documentation
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
## 2016-02-11 NOTE — Patient Instructions (Addendum)
Medication Instructions:  Your physician recommends that you continue on your current medications as directed. Please refer to the Current Medication list given to you today.   Labwork: -None  Testing/Procedures:  A chest x-ray takes a picture of the organs and structures inside the chest, including the heart, lungs, and blood vessels. This test can show several things, including, whether the heart is enlarges; whether fluid is building up in the lungs; and whether pacemaker / defibrillator leads are still in place. [facility]   Your provider has recommended a cardiac catherization  You are scheduled for a cardiac catheterization on Thursday, June 1 with Dr. Claiborne Billings or associate.  Go to [facility] 2nd Floor Short Stay on Thursday, June 1 at 10:00 am.  Enter thru the [place] entrance A No food or drink after midnight on [place], Wednesday May 31. You may take your medications with a sip of water on the day of your procedure.   Coronary Angiogram A coronary angiogram, also called coronary angiography, is an X-ray procedure used to look at the arteries in the heart. In this procedure, a dye (contrast dye) is injected through a long, hollow tube (catheter). The catheter is about the size of a piece of cooked spaghetti and is inserted through your groin, wrist, or arm. The dye is injected into each artery, and X-rays are then taken to show if there is a blockage in the arteries of your heart.  LET [facility] CARE PROVIDER KNOW ABOUT:  Any allergies you have, including allergies to shellfish or contrast dye.   All medicines you are taking, including vitamins, herbs, eye drops, creams, and over-the-counter medicines.   Previous problems you or members of your family have had with the use of anesthetics.   Any blood disorders you have.   Previous surgeries you have had.  History of kidney problems or failure.   Other medical conditions you have.  RISKS AND  COMPLICATIONS  Generally, a coronary angiogram is a safe procedure. However, about 1 person out of 1000 can have problems that may include:  Allergic reaction to the dye.  Bleeding/bruising from the access site or other locations.  Kidney injury, especially in people with impaired kidney function.  Stroke (rare).  Heart attack (rare).  Irregular rhythms (rare)  Death (rare)  BEFORE THE PROCEDURE   Do not eat or drink anything after midnight the night before the procedure or as directed by your health care provider.   Ask your health care provider about changing or stopping your regular medicines. This is especially important if you are taking diabetes medicines or blood thinners.  PROCEDURE  You may be given a medicine to help you relax (sedative) before the procedure. This medicine is given through an intravenous (IV) access tube that is inserted into one of your veins.   The area where the catheter will be inserted will be washed and shaved. This is usually done in the groin but may be done in the fold of your arm (near your elbow) or in the wrist.   A medicine will be given to numb the area where the catheter will be inserted (local anesthetic).   The health care provider will insert the catheter into an artery. The catheter will be guided by using a special type of X-ray (fluoroscopy) of the blood vessel being examined.   A special dye will then be injected into the catheter, and X-rays will be taken. The dye will help to show where any  narrowing or blockages are located in the heart arteries.    AFTER THE PROCEDURE   If the procedure is done through the leg, you will be kept in bed lying flat for several hours. You will be instructed to not bend or cross your legs.  The insertion site will be checked frequently.   The pulse in your feet or wrist will be checked frequently.   Additional blood tests, X-rays, and an electrocardiogram may be done.      Follow-Up: -As needed, to be determined.  Any Other Special Instructions Will Be Listed Below (If Applicable).     If you need a refill on your cardiac medications before your next appointment, please call your pharmacy.

## 2016-02-11 NOTE — Addendum Note (Signed)
Addended by: Murray Hodgkins R on: 02/11/2016 10:57 AM   Modules accepted: Orders

## 2016-02-12 ENCOUNTER — Ambulatory Visit (HOSPITAL_COMMUNITY)
Admission: RE | Admit: 2016-02-12 | Discharge: 2016-02-12 | Disposition: A | Discharge status: Home / Self Care | Payer: Medicare HMO | Source: Ambulatory Visit | Attending: Cardiovascular Disease | Admitting: Cardiovascular Disease

## 2016-02-12 ENCOUNTER — Encounter (HOSPITAL_COMMUNITY)
Admission: RE | Disposition: A | Discharge status: Home / Self Care | Payer: Self-pay | Source: Ambulatory Visit | Attending: Cardiovascular Disease

## 2016-02-12 ENCOUNTER — Other Ambulatory Visit: Payer: Self-pay

## 2016-02-12 ENCOUNTER — Ambulatory Visit (HOSPITAL_COMMUNITY): Admission: RE | Admit: 2016-02-12 | Payer: Medicare HMO | Source: Ambulatory Visit | Admitting: Urology

## 2016-02-12 ENCOUNTER — Encounter (HOSPITAL_COMMUNITY): Admission: RE | Payer: Self-pay | Source: Ambulatory Visit

## 2016-02-12 ENCOUNTER — Encounter (HOSPITAL_COMMUNITY): Payer: Self-pay | Admitting: *Deleted

## 2016-02-12 DIAGNOSIS — Z85038 Personal history of other malignant neoplasm of large intestine: Secondary | ICD-10-CM | POA: Diagnosis not present

## 2016-02-12 DIAGNOSIS — I69351 Hemiplegia and hemiparesis following cerebral infarction affecting right dominant side: Secondary | ICD-10-CM | POA: Insufficient documentation

## 2016-02-12 DIAGNOSIS — Z91040 Latex allergy status: Secondary | ICD-10-CM | POA: Insufficient documentation

## 2016-02-12 DIAGNOSIS — J449 Chronic obstructive pulmonary disease, unspecified: Secondary | ICD-10-CM | POA: Diagnosis not present

## 2016-02-12 DIAGNOSIS — Z01810 Encounter for preprocedural cardiovascular examination: Secondary | ICD-10-CM | POA: Diagnosis present

## 2016-02-12 DIAGNOSIS — Z7982 Long term (current) use of aspirin: Secondary | ICD-10-CM | POA: Diagnosis not present

## 2016-02-12 DIAGNOSIS — I200 Unstable angina: Secondary | ICD-10-CM | POA: Insufficient documentation

## 2016-02-12 DIAGNOSIS — E669 Obesity, unspecified: Secondary | ICD-10-CM | POA: Insufficient documentation

## 2016-02-12 DIAGNOSIS — I25110 Atherosclerotic heart disease of native coronary artery with unstable angina pectoris: Secondary | ICD-10-CM | POA: Diagnosis not present

## 2016-02-12 DIAGNOSIS — Z8249 Family history of ischemic heart disease and other diseases of the circulatory system: Secondary | ICD-10-CM | POA: Diagnosis not present

## 2016-02-12 DIAGNOSIS — I10 Essential (primary) hypertension: Secondary | ICD-10-CM | POA: Diagnosis not present

## 2016-02-12 DIAGNOSIS — Z01818 Encounter for other preprocedural examination: Secondary | ICD-10-CM | POA: Diagnosis not present

## 2016-02-12 DIAGNOSIS — Z885 Allergy status to narcotic agent status: Secondary | ICD-10-CM | POA: Insufficient documentation

## 2016-02-12 DIAGNOSIS — Z7902 Long term (current) use of antithrombotics/antiplatelets: Secondary | ICD-10-CM | POA: Insufficient documentation

## 2016-02-12 DIAGNOSIS — F1210 Cannabis abuse, uncomplicated: Secondary | ICD-10-CM | POA: Insufficient documentation

## 2016-02-12 DIAGNOSIS — Z6835 Body mass index (BMI) 35.0-35.9, adult: Secondary | ICD-10-CM | POA: Diagnosis not present

## 2016-02-12 DIAGNOSIS — Z87442 Personal history of urinary calculi: Secondary | ICD-10-CM | POA: Insufficient documentation

## 2016-02-12 DIAGNOSIS — J45909 Unspecified asthma, uncomplicated: Secondary | ICD-10-CM | POA: Insufficient documentation

## 2016-02-12 DIAGNOSIS — Z8551 Personal history of malignant neoplasm of bladder: Secondary | ICD-10-CM | POA: Diagnosis not present

## 2016-02-12 DIAGNOSIS — E785 Hyperlipidemia, unspecified: Secondary | ICD-10-CM | POA: Diagnosis not present

## 2016-02-12 DIAGNOSIS — F17210 Nicotine dependence, cigarettes, uncomplicated: Secondary | ICD-10-CM | POA: Diagnosis not present

## 2016-02-12 HISTORY — PX: CARDIAC CATHETERIZATION: SHX172

## 2016-02-12 SURGERY — NEPHROLITHOTOMY PERCUTANEOUS
Anesthesia: General | Laterality: Right

## 2016-02-12 SURGERY — LEFT HEART CATH AND CORONARY ANGIOGRAPHY

## 2016-02-12 MED ORDER — ASPIRIN 81 MG PO CHEW: 81.0000 mg | CHEWABLE_TABLET | ORAL | Status: DC

## 2016-02-12 MED ORDER — SODIUM CHLORIDE 0.9 % IV SOLN: INTRAVENOUS | Status: DC

## 2016-02-12 MED ORDER — SODIUM CHLORIDE 0.9 % WEIGHT BASED INFUSION: 1.0000 mL/kg/h | INTRAVENOUS | Status: DC

## 2016-02-12 MED ORDER — LIDOCAINE HCL (PF) 1 % IJ SOLN
INTRAMUSCULAR | Status: AC
  Filled 2016-02-12: qty 30

## 2016-02-12 MED ORDER — HEPARIN (PORCINE) IN NACL 2-0.9 UNIT/ML-% IJ SOLN
INTRAMUSCULAR | Status: DC | PRN
  Administered 2016-02-12: 1000 mL

## 2016-02-12 MED ORDER — SODIUM CHLORIDE 0.9 % WEIGHT BASED INFUSION
3.0000 mL/kg/h | INTRAVENOUS | Status: DC
  Administered 2016-02-12: 3 mL/kg/h via INTRAVENOUS

## 2016-02-12 MED ORDER — ACETAMINOPHEN 325 MG PO TABS: 650.0000 mg | ORAL_TABLET | ORAL | Status: DC | PRN

## 2016-02-12 MED ORDER — MIDAZOLAM HCL 2 MG/2ML IJ SOLN
INTRAMUSCULAR | Status: AC
  Filled 2016-02-12: qty 2

## 2016-02-12 MED ORDER — HEPARIN SODIUM (PORCINE) 1000 UNIT/ML IJ SOLN
INTRAMUSCULAR | Status: AC
  Filled 2016-02-12: qty 1

## 2016-02-12 MED ORDER — SODIUM CHLORIDE 0.9% FLUSH: 3.0000 mL | INTRAVENOUS | Status: DC | PRN

## 2016-02-12 MED ORDER — LIDOCAINE HCL (PF) 1 % IJ SOLN
INTRAMUSCULAR | Status: DC | PRN
  Administered 2016-02-12: 3 mL

## 2016-02-12 MED ORDER — FENTANYL CITRATE (PF) 100 MCG/2ML IJ SOLN
INTRAMUSCULAR | Status: DC | PRN
  Administered 2016-02-12: 50 ug via INTRAVENOUS

## 2016-02-12 MED ORDER — IOPAMIDOL (ISOVUE-370) INJECTION 76%
INTRAVENOUS | Status: DC | PRN
  Administered 2016-02-12: 70 mL via INTRAVENOUS

## 2016-02-12 MED ORDER — SODIUM CHLORIDE 0.9% FLUSH: 3.0000 mL | Freq: Two times a day (BID) | INTRAVENOUS | Status: DC

## 2016-02-12 MED ORDER — SODIUM CHLORIDE 0.9 % IV SOLN: 250.0000 mL | INTRAVENOUS | Status: DC | PRN

## 2016-02-12 MED ORDER — ASPIRIN 81 MG PO CHEW
CHEWABLE_TABLET | ORAL | Status: AC
  Administered 2016-02-12: 81 mg
  Filled 2016-02-12: qty 1

## 2016-02-12 MED ORDER — VERAPAMIL HCL 2.5 MG/ML IV SOLN
INTRA_ARTERIAL | Status: DC | PRN
  Administered 2016-02-12: 10 mL via INTRA_ARTERIAL

## 2016-02-12 MED ORDER — MIDAZOLAM HCL 2 MG/2ML IJ SOLN
INTRAMUSCULAR | Status: DC | PRN
  Administered 2016-02-12: 2 mg via INTRAVENOUS
  Administered 2016-02-12: 1 mg via INTRAVENOUS

## 2016-02-12 MED ORDER — DIAZEPAM 5 MG PO TABS: 5.0000 mg | ORAL_TABLET | Freq: Four times a day (QID) | ORAL | Status: DC | PRN

## 2016-02-12 MED ORDER — VERAPAMIL HCL 2.5 MG/ML IV SOLN
INTRAVENOUS | Status: AC
  Filled 2016-02-12: qty 2

## 2016-02-12 MED ORDER — ONDANSETRON HCL 4 MG/2ML IJ SOLN: 4.0000 mg | Freq: Four times a day (QID) | INTRAMUSCULAR | Status: DC | PRN

## 2016-02-12 MED ORDER — FENTANYL CITRATE (PF) 100 MCG/2ML IJ SOLN
INTRAMUSCULAR | Status: AC
  Filled 2016-02-12: qty 2

## 2016-02-12 MED ORDER — HEPARIN (PORCINE) IN NACL 2-0.9 UNIT/ML-% IJ SOLN
INTRAMUSCULAR | Status: AC
  Filled 2016-02-12: qty 1000

## 2016-02-12 MED ORDER — HEPARIN SODIUM (PORCINE) 1000 UNIT/ML IJ SOLN
INTRAMUSCULAR | Status: DC | PRN
  Administered 2016-02-12: 6000 [IU] via INTRAVENOUS

## 2016-02-12 SURGICAL SUPPLY — 11 items
CATH INFINITI 5FR ANG PIGTAIL (CATHETERS) ×2 IMPLANT
CATH INFINITI JR4 5F (CATHETERS) ×2 IMPLANT
CATH OPTITORQUE TIG 4.0 5F (CATHETERS) ×2 IMPLANT
GLIDESHEATH SLEND A-KIT 6F 22G (SHEATH) IMPLANT
GLIDESHEATH SLEND SS 6F .021 (SHEATH) ×4 IMPLANT
KIT HEART LEFT (KITS) ×3 IMPLANT
PACK CARDIAC CATHETERIZATION (CUSTOM PROCEDURE TRAY) ×3 IMPLANT
SYR MEDRAD MARK V 150ML (SYRINGE) ×3 IMPLANT
TRANSDUCER W/STOPCOCK (MISCELLANEOUS) ×3 IMPLANT
TUBING CIL FLEX 10 FLL-RA (TUBING) ×3 IMPLANT
WIRE SAFE-T 1.5MM-J .035X260CM (WIRE) ×2 IMPLANT

## 2016-02-12 NOTE — Research (Signed)
[place] Study Informed Consent   Subject Name: Craig Owens  Subject met inclusion and exclusion criteria.  The informed consent form, study requirements and expectations were reviewed with the subject and questions and concerns were addressed prior to the signing of the consent form.  The subject verbalized understanding of the trial requirements.  The subject agreed to participate in the trial and signed the informed consent.  The informed consent was obtained prior to performance of any protocol-specific procedures for the subject.  A copy of the signed informed consent was given to the subject and a copy was placed in the subject's medical record.  Blossom Hoops 02/12/2016, 12:52 PM

## 2016-02-12 NOTE — Discharge Instructions (Signed)
Radial Site Care °Refer to this sheet in the next few weeks. These instructions provide you with information about caring for yourself after your procedure. Your health care provider may also give you more specific instructions. Your treatment has been planned according to current medical practices, but problems sometimes occur. Call your health care provider if you have any problems or questions after your procedure. °WHAT TO EXPECT AFTER THE PROCEDURE °After your procedure, it is typical to have the following: °· Bruising at the radial site that usually fades within 1-2 weeks. °· Blood collecting in the tissue (hematoma) that may be painful to the touch. It should usually decrease in size and tenderness within 1-2 weeks. °HOME CARE INSTRUCTIONS °· Take medicines only as directed by your health care provider. °· You may shower 24-48 hours after the procedure or as directed by your health care provider. Remove the bandage (dressing) and gently wash the site with plain soap and water. Pat the area dry with a clean towel. Do not rub the site, because this may cause bleeding. °· Do not take baths, swim, or use a hot tub until your health care provider approves. °· Check your insertion site every day for redness, swelling, or drainage. °· Do not apply powder or lotion to the site. °· Do not flex or bend the affected arm for 24 hours or as directed by your health care provider. °· Do not push or pull heavy objects with the affected arm for 24 hours or as directed by your health care provider. °· Do not lift over 10 lb (4.5 kg) for 5 days after your procedure or as directed by your health care provider. °· Ask your health care provider when it is okay to: °¨ Return to work or school. °¨ Resume usual physical activities or sports. °¨ Resume sexual activity. °· Do not drive home if you are discharged the same day as the procedure. Have someone else drive you. °· You may drive 24 hours after the procedure unless otherwise  instructed by your health care provider. °· Do not operate machinery or power tools for 24 hours after the procedure. °· If your procedure was done as an outpatient procedure, which means that you went home the same day as your procedure, a responsible adult should be with you for the first 24 hours after you arrive home. °· Keep all follow-up visits as directed by your health care provider. This is important. °SEEK MEDICAL CARE IF: °· You have a fever. °· You have chills. °· You have increased bleeding from the radial site. Hold pressure on the site. °SEEK IMMEDIATE MEDICAL CARE IF: °· You have unusual pain at the radial site. °· You have redness, warmth, or swelling at the radial site. °· You have drainage (other than a small amount of blood on the dressing) from the radial site. °· The radial site is bleeding, and the bleeding does not stop after 30 minutes of holding steady pressure on the site. °· Your arm or hand becomes pale, cool, tingly, or numb. °  °This information is not intended to replace advice given to you by your health care provider. Make sure you discuss any questions you have with your health care provider. °  °Document Released: 10/02/2010 Document Revised: 09/20/2014 Document Reviewed: 03/18/2014 °Elsevier Interactive Patient Education ©2016 [employer] ° °

## 2016-02-12 NOTE — Interval H&P Note (Signed)
Cath Lab Visit (complete for each Cath Lab visit)  Clinical Evaluation Leading to the Procedure:   ACS: No.  Non-ACS:    Anginal Classification: CCS II  Anti-ischemic medical therapy: No Therapy  Non-Invasive Test Results: No non-invasive testing performed  Prior CABG: No previous CABG      History and Physical Interval Note:  02/12/2016 2:50 PM  Craig Owens  has presented today for surgery, with the diagnosis of unstable angina  The various methods of treatment have been discussed with the patient and family. After consideration of risks, benefits and other options for treatment, the patient has consented to  Procedure(s): Left Heart Cath and Coronary Angiography (N/A) as a surgical intervention .  The patient's history has been reviewed, patient examined, no change in status, stable for surgery.  I have reviewed the patient's chart and labs.  Questions were answered to the patient's satisfaction.     Shelva Majestic
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
## 2016-02-13 ENCOUNTER — Encounter (HOSPITAL_COMMUNITY): Payer: Self-pay | Admitting: Cardiovascular Disease

## 2016-02-13 LAB — TYPE AND SCREEN
ABO/RH(D): A POS
Antibody Screen: POSITIVE
DAT, IgG: NEGATIVE
DONOR AG TYPE: NEGATIVE
Donor AG Type: NEGATIVE
UNIT DIVISION: 0
Unit division: 0

## 2016-02-17 ENCOUNTER — Ambulatory Visit: Payer: Medicare HMO | Admitting: Cardiovascular Disease
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
## 2016-02-25 ENCOUNTER — Encounter: Payer: Self-pay | Admitting: Cardiovascular Disease

## 2016-02-25 ENCOUNTER — Ambulatory Visit (INDEPENDENT_AMBULATORY_CARE_PROVIDER_SITE_OTHER): Payer: Medicare HMO | Admitting: Cardiovascular Disease

## 2016-02-25 VITALS — BP 112/68 | HR 88 | Ht 73.0 in | Wt 259.0 lb

## 2016-02-25 DIAGNOSIS — I200 Unstable angina: Secondary | ICD-10-CM

## 2016-02-25 NOTE — Patient Instructions (Signed)
Your physician recommends that you schedule a follow-up appointment in: As Needed with Dr. Johnsie Cancel  Your physician recommends that you continue on your current medications as directed. Please refer to the Current Medication list given to you today.  Thank you for choosing [facility]!

## 2016-02-26 ENCOUNTER — Other Ambulatory Visit: Payer: Self-pay | Admitting: Urology

## 2016-02-27 ENCOUNTER — Other Ambulatory Visit (HOSPITAL_COMMUNITY)
Admission: RE | Admit: 2016-02-27 | Discharge: 2016-02-27 | Disposition: A | Discharge status: Home / Self Care | Payer: Medicare HMO | Source: Other Acute Inpatient Hospital | Attending: Urology | Admitting: Urology

## 2016-02-27 ENCOUNTER — Ambulatory Visit (INDEPENDENT_AMBULATORY_CARE_PROVIDER_SITE_OTHER): Payer: Medicare HMO | Admitting: Urology

## 2016-02-27 DIAGNOSIS — N200 Calculus of kidney: Secondary | ICD-10-CM | POA: Diagnosis not present

## 2016-02-27 LAB — URINE MICROSCOPIC-ADD ON

## 2016-02-27 LAB — URINALYSIS, ROUTINE W REFLEX MICROSCOPIC
Bilirubin Urine: NEGATIVE
Glucose, UA: NEGATIVE mg/dL
Ketones, ur: NEGATIVE mg/dL
NITRITE: NEGATIVE
PH: 5.5 (ref 5.0–8.0)
Protein, ur: 100 mg/dL — AB
SPECIFIC GRAVITY, URINE: 1.025 (ref 1.005–1.030)

## 2016-02-29 LAB — URINE CULTURE

## 2016-03-30 NOTE — Patient Instructions (Signed)
Craig Owens  03/30/2016   Your procedure is scheduled on: 04/06/2016   Report to [facility] Main  Entrance take [place]  elevators to 3rd floor to  [place] at    0730 am Go to Radiology first then take [place] to Short STay  Call this number if you have problems the morning of surgery [phone]   Remember: ONLY 1 PERSON MAY GO WITH YOU TO SHORT STAY TO GET  [place].  Do not eat food or drink liquids :After Midnight.     Take these medicines the morning of surgery with A SIP OF WATER: Albuterol Inhaler if needed and bring, Oxycodone if needed                                 You may not have any metal on your body including hair pins and              piercings  Do not wear jewelry,  lotions, powders or perfumes, deodorant                           Men may shave face and neck.   Do not bring valuables to the hospital. [place].  Contacts, dentures or bridgework may not be worn into surgery.  Leave suitcase in the car. After surgery it may be brought to your room.        Special Instructions: coughing and deep breathing exercises, leg exercises               Please read over the following fact sheets you were given: _____________________________________________________________________             [facility] - Preparing for Surgery Before surgery, you can play an important role.  Because skin is not sterile, your skin needs to be as free of germs as possible.  You can reduce the number of germs on your skin by washing with CHG (chlorahexidine gluconate) soap before surgery.  CHG is an antiseptic cleaner which kills germs and bonds with the skin to continue killing germs even after washing. Please DO NOT use if you have an allergy to CHG or antibacterial soaps.  If your skin becomes reddened/irritated stop using the CHG and inform your nurse when you arrive at Short  Stay. Do not shave (including legs and underarms) for at least 48 hours prior to the first CHG shower.  You may shave your face/neck. Please follow these instructions carefully:  1.  Shower with CHG Soap the night before surgery and the  morning of Surgery.  2.  If you choose to wash your hair, wash your hair first as usual with your  normal  shampoo.  3.  After you shampoo, rinse your hair and body thoroughly to remove the  shampoo.                           4.  Use CHG as you would any other liquid soap.  You can apply chg directly  to the skin and wash  Gently with a scrungie or clean washcloth.  5.  Apply the CHG Soap to your body ONLY FROM THE NECK DOWN.   Do not use on face/ open                           Wound or open sores. Avoid contact with eyes, ears mouth and genitals (private parts).                       Wash face,  Genitals (private parts) with your normal soap.             6.  Wash thoroughly, paying special attention to the area where your surgery  will be performed.  7.  Thoroughly rinse your body with warm water from the neck down.  8.  DO NOT shower/wash with your normal soap after using and rinsing off  the CHG Soap.                9.  Pat yourself dry with a clean towel.            10.  Wear clean pajamas.            11.  Place clean sheets on your bed the night of your first shower and do not  sleep with pets. Day of Surgery : Do not apply any lotions/deodorants the morning of surgery.  Please wear clean clothes to the hospital/surgery center.  FAILURE TO FOLLOW THESE INSTRUCTIONS MAY RESULT IN THE CANCELLATION OF YOUR SURGERY PATIENT SIGNATURE_________________________________  NURSE SIGNATURE__________________________________  ________________________________________________________________________  WHAT IS A BLOOD TRANSFUSION? Blood Transfusion Information  A transfusion is the replacement of blood or some of its parts. Blood is made up of  multiple cells which provide different functions.  Red blood cells carry oxygen and are used for blood loss replacement.  White blood cells fight against infection.  Platelets control bleeding.  Plasma helps clot blood.  Other blood products are available for specialized needs, such as hemophilia or other clotting disorders. BEFORE THE TRANSFUSION  Who gives blood for transfusions?   Healthy volunteers who are fully evaluated to make sure their blood is safe. This is blood bank blood. Transfusion therapy is the safest it has ever been in the practice of medicine. Before blood is taken from a donor, a complete history is taken to make sure that person has no history of diseases nor engages in risky social behavior (examples are intravenous drug use or sexual activity with multiple partners). The donor's travel history is screened to minimize risk of transmitting infections, such as malaria. The donated blood is tested for signs of infectious diseases, such as HIV and hepatitis. The blood is then tested to be sure it is compatible with you in order to minimize the chance of a transfusion reaction. If you or a relative donates blood, this is often done in anticipation of surgery and is not appropriate for emergency situations. It takes many days to process the donated blood. RISKS AND COMPLICATIONS Although transfusion therapy is very safe and saves many lives, the main dangers of transfusion include:   Getting an infectious disease.  Developing a transfusion reaction. This is an allergic reaction to something in the blood you were given. Every precaution is taken to prevent this. The decision to have a blood transfusion has been considered carefully by your caregiver before blood is given. Blood is not given unless the benefits outweigh  the risks. AFTER THE TRANSFUSION  Right after receiving a blood transfusion, you will usually feel much better and more energetic. This is especially true if  your red blood cells have gotten low (anemic). The transfusion raises the level of the red blood cells which carry oxygen, and this usually causes an energy increase.  The nurse administering the transfusion will monitor you carefully for complications. HOME CARE INSTRUCTIONS  No special instructions are needed after a transfusion. You may find your energy is better. Speak with your caregiver about any limitations on activity for underlying diseases you may have. SEEK MEDICAL CARE IF:   Your condition is not improving after your transfusion.  You develop redness or irritation at the intravenous (IV) site. SEEK IMMEDIATE MEDICAL CARE IF:  Any of the following symptoms occur over the next 12 hours:  Shaking chills.  You have a temperature by mouth above 102 F (38.9 C), not controlled by medicine.  Chest, back, or muscle pain.  People around you feel you are not acting correctly or are confused.  Shortness of breath or difficulty breathing.  Dizziness and fainting.  You get a rash or develop hives.  You have a decrease in urine output.  Your urine turns a dark color or changes to pink, red, or brown. Any of the following symptoms occur over the next 10 days:  You have a temperature by mouth above 102 F (38.9 C), not controlled by medicine.  Shortness of breath.  Weakness after normal activity.  The white part of the eye turns yellow (jaundice).  You have a decrease in the amount of urine or are urinating less often.  Your urine turns a dark color or changes to pink, red, or brown. Document Released: 08/27/2000 Document Revised: 11/22/2011 Document Reviewed: 04/15/2008 [facility] Patient Information 2014 [place], [place].  _______________________________________________________________________

## 2016-03-31 ENCOUNTER — Encounter (HOSPITAL_COMMUNITY)
Admission: RE | Admit: 2016-03-31 | Discharge: 2016-03-31 | Disposition: A | Discharge status: Home / Self Care | Payer: Medicare HMO | Source: Ambulatory Visit | Attending: Urology | Admitting: Urology

## 2016-03-31 ENCOUNTER — Encounter (HOSPITAL_COMMUNITY): Payer: Self-pay

## 2016-03-31 DIAGNOSIS — Z01812 Encounter for preprocedural laboratory examination: Secondary | ICD-10-CM | POA: Insufficient documentation

## 2016-03-31 HISTORY — DX: Bitten or stung by nonvenomous insect and other nonvenomous arthropods, initial encounter: W57.XXXA

## 2016-03-31 LAB — CBC
HCT: 43.3 % (ref 39.0–52.0)
HEMOGLOBIN: 14.2 g/dL (ref 13.0–17.0)
MCH: 28.9 pg (ref 26.0–34.0)
MCHC: 32.8 g/dL (ref 30.0–36.0)
MCV: 88 fL (ref 78.0–100.0)
PLATELETS: 221 10*3/uL (ref 150–400)
RBC: 4.92 MIL/uL (ref 4.22–5.81)
RDW: 13.5 % (ref 11.5–15.5)
WBC: 11 10*3/uL — ABNORMAL HIGH (ref 4.0–10.5)

## 2016-03-31 LAB — BASIC METABOLIC PANEL
Anion gap: 7 (ref 5–15)
BUN: 16 mg/dL (ref 6–20)
CALCIUM: 8.5 mg/dL — AB (ref 8.9–10.3)
CHLORIDE: 109 mmol/L (ref 101–111)
CO2: 22 mmol/L (ref 22–32)
CREATININE: 1.04 mg/dL (ref 0.61–1.24)
Glucose, Bld: 161 mg/dL — ABNORMAL HIGH (ref 65–99)
Potassium: 3.7 mmol/L (ref 3.5–5.1)
SODIUM: 138 mmol/L (ref 135–145)
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
## 2016-03-31 NOTE — Progress Notes (Signed)
02/11/16- LOV- Cardiology- EPIC  02/25/16- [facility]- Cardiology- EPIC  CXR- EPIC  EKG- 02/12/2016- EPIC  Cath- 02/12/2016- EPIC

## 2016-03-31 NOTE — Progress Notes (Signed)
CBC done 03/31/16 faxed via EPIC to Dr Jeffie Pollock.

## 2016-04-05 ENCOUNTER — Other Ambulatory Visit: Payer: Self-pay | Admitting: Radiology

## 2016-04-05 MED ORDER — CEFAZOLIN SODIUM 10 G IJ SOLR
3.0000 g | INTRAMUSCULAR | Status: DC
  Filled 2016-04-05: qty 3000
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
## 2016-04-06 ENCOUNTER — Ambulatory Visit (HOSPITAL_COMMUNITY): Payer: Medicare Other | Admitting: Certified Registered Nurse Anesthetist

## 2016-04-06 ENCOUNTER — Inpatient Hospital Stay (HOSPITAL_COMMUNITY)
Admission: RE | Admit: 2016-04-06 | Discharge: 2016-04-09 | DRG: 661 | Disposition: A | Discharge status: Home / Self Care | Payer: Medicare Other | Source: Ambulatory Visit | Attending: Urology | Admitting: Urology

## 2016-04-06 ENCOUNTER — Ambulatory Visit (HOSPITAL_COMMUNITY): Admission: RE | Admit: 2016-04-06 | Payer: Medicare HMO | Source: Ambulatory Visit

## 2016-04-06 ENCOUNTER — Encounter (HOSPITAL_COMMUNITY)
Admission: RE | Disposition: A | Discharge status: Home / Self Care | Payer: Self-pay | Source: Ambulatory Visit | Attending: Urology

## 2016-04-06 ENCOUNTER — Encounter (HOSPITAL_COMMUNITY): Payer: Self-pay | Admitting: *Deleted

## 2016-04-06 ENCOUNTER — Ambulatory Visit (HOSPITAL_COMMUNITY): Payer: Medicare Other

## 2016-04-06 ENCOUNTER — Ambulatory Visit (HOSPITAL_COMMUNITY)
Admission: RE | Admit: 2016-04-06 | Discharge: 2016-04-06 | Disposition: A | Discharge status: Home / Self Care | Payer: Medicare Other | Source: Ambulatory Visit | Attending: Urology | Admitting: Urology

## 2016-04-06 ENCOUNTER — Inpatient Hospital Stay (HOSPITAL_COMMUNITY): Payer: Medicare Other

## 2016-04-06 DIAGNOSIS — Z9049 Acquired absence of other specified parts of digestive tract: Secondary | ICD-10-CM

## 2016-04-06 DIAGNOSIS — Z7982 Long term (current) use of aspirin: Secondary | ICD-10-CM | POA: Diagnosis not present

## 2016-04-06 DIAGNOSIS — Z8551 Personal history of malignant neoplasm of bladder: Secondary | ICD-10-CM | POA: Diagnosis not present

## 2016-04-06 DIAGNOSIS — Z91040 Latex allergy status: Secondary | ICD-10-CM

## 2016-04-06 DIAGNOSIS — K219 Gastro-esophageal reflux disease without esophagitis: Secondary | ICD-10-CM | POA: Diagnosis present

## 2016-04-06 DIAGNOSIS — N200 Calculus of kidney: Secondary | ICD-10-CM | POA: Diagnosis present

## 2016-04-06 DIAGNOSIS — Z7902 Long term (current) use of antithrombotics/antiplatelets: Secondary | ICD-10-CM

## 2016-04-06 DIAGNOSIS — Z8673 Personal history of transient ischemic attack (TIA), and cerebral infarction without residual deficits: Secondary | ICD-10-CM | POA: Diagnosis not present

## 2016-04-06 DIAGNOSIS — Z85038 Personal history of other malignant neoplasm of large intestine: Secondary | ICD-10-CM | POA: Diagnosis not present

## 2016-04-06 DIAGNOSIS — Z885 Allergy status to narcotic agent status: Secondary | ICD-10-CM

## 2016-04-06 DIAGNOSIS — Z91048 Other nonmedicinal substance allergy status: Secondary | ICD-10-CM | POA: Diagnosis not present

## 2016-04-06 DIAGNOSIS — F17210 Nicotine dependence, cigarettes, uncomplicated: Secondary | ICD-10-CM | POA: Diagnosis present

## 2016-04-06 DIAGNOSIS — J449 Chronic obstructive pulmonary disease, unspecified: Secondary | ICD-10-CM | POA: Diagnosis present

## 2016-04-06 DIAGNOSIS — Z419 Encounter for procedure for purposes other than remedying health state, unspecified: Secondary | ICD-10-CM

## 2016-04-06 DIAGNOSIS — Z6834 Body mass index (BMI) 34.0-34.9, adult: Secondary | ICD-10-CM | POA: Diagnosis not present

## 2016-04-06 DIAGNOSIS — I2510 Atherosclerotic heart disease of native coronary artery without angina pectoris: Secondary | ICD-10-CM | POA: Diagnosis present

## 2016-04-06 HISTORY — PX: IR GENERIC HISTORICAL: IMG1180011

## 2016-04-06 HISTORY — PX: NEPHROLITHOTOMY: SHX5134

## 2016-04-06 LAB — BASIC METABOLIC PANEL
Anion gap: 7 (ref 5–15)
BUN: 22 mg/dL — AB (ref 6–20)
CHLORIDE: 109 mmol/L (ref 101–111)
CO2: 22 mmol/L (ref 22–32)
CREATININE: 1.01 mg/dL (ref 0.61–1.24)
Calcium: 8.7 mg/dL — ABNORMAL LOW (ref 8.9–10.3)
GFR calc Af Amer: >60 mL/min (ref >60)
GFR calc non Af Amer: >60 mL/min (ref >60)
Glucose, Bld: 143 mg/dL — ABNORMAL HIGH (ref 65–99)
Potassium: 3.6 mmol/L (ref 3.5–5.1)
Sodium: 138 mmol/L (ref 135–145)

## 2016-04-06 LAB — CBC WITH DIFFERENTIAL/PLATELET
Basophils Absolute: 0 10*3/uL (ref 0.0–0.1)
Basophils Relative: 0 %
EOS ABS: 0.1 10*3/uL (ref 0.0–0.7)
EOS PCT: 1 %
HCT: 45.2 % (ref 39.0–52.0)
Hemoglobin: 14.9 g/dL (ref 13.0–17.0)
LYMPHS ABS: 1.9 10*3/uL (ref 0.7–4.0)
Lymphocytes Relative: 17 %
MCH: 29.2 pg (ref 26.0–34.0)
MCHC: 33 g/dL (ref 30.0–36.0)
MCV: 88.6 fL (ref 78.0–100.0)
MONO ABS: 0.7 10*3/uL (ref 0.1–1.0)
MONOS PCT: 6 %
Neutro Abs: 8.2 10*3/uL — ABNORMAL HIGH (ref 1.7–7.7)
Neutrophils Relative %: 76 %
PLATELETS: 215 10*3/uL (ref 150–400)
RBC: 5.1 MIL/uL (ref 4.22–5.81)
RDW: 13.5 % (ref 11.5–15.5)
WBC: 10.8 10*3/uL — ABNORMAL HIGH (ref 4.0–10.5)

## 2016-04-06 LAB — TYPE AND SCREEN
ABO/RH(D): A POS
Antibody Screen: POSITIVE
DAT, IGG: NEGATIVE

## 2016-04-06 LAB — HEMOGLOBIN AND HEMATOCRIT, BLOOD
HCT: 45.6 % (ref 39.0–52.0)
Hemoglobin: 14.6 g/dL (ref 13.0–17.0)

## 2016-04-06 LAB — PROTIME-INR
INR: 1.04 (ref 0.00–1.49)
PROTHROMBIN TIME: 13.8 s (ref 11.6–15.2)

## 2016-04-06 LAB — APTT: aPTT: 28 seconds (ref 24–37)

## 2016-04-06 SURGERY — NEPHROLITHOTOMY PERCUTANEOUS
Anesthesia: General | Laterality: Left

## 2016-04-06 MED ORDER — CEFAZOLIN SODIUM-DEXTROSE 2-4 GM/100ML-% IV SOLN
2.0000 g | INTRAVENOUS | Status: AC
  Administered 2016-04-06: 2 g via INTRAVENOUS
  Filled 2016-04-06: qty 100

## 2016-04-06 MED ORDER — PROPOFOL 10 MG/ML IV BOLUS
INTRAVENOUS | Status: DC | PRN
  Administered 2016-04-06: 200 mg via INTRAVENOUS

## 2016-04-06 MED ORDER — FENTANYL CITRATE (PF) 100 MCG/2ML IJ SOLN
INTRAMUSCULAR | Status: AC
  Filled 2016-04-06: qty 4

## 2016-04-06 MED ORDER — OXYCODONE HCL 5 MG PO TABS
5.0000 mg | ORAL_TABLET | Freq: Four times a day (QID) | ORAL | Status: DC | PRN
  Administered 2016-04-06 – 2016-04-09 (×8): 5 mg via ORAL
  Filled 2016-04-06 (×8): qty 1

## 2016-04-06 MED ORDER — SODIUM CHLORIDE 0.9 % IR SOLN
Status: DC | PRN
  Administered 2016-04-06: 9000 mL

## 2016-04-06 MED ORDER — SUGAMMADEX SODIUM 200 MG/2ML IV SOLN
INTRAVENOUS | Status: AC
  Filled 2016-04-06: qty 4

## 2016-04-06 MED ORDER — OXYCODONE HCL 5 MG PO TABS: 10.0000 mg | ORAL_TABLET | Freq: Once | ORAL | Status: DC | PRN

## 2016-04-06 MED ORDER — CIPROFLOXACIN IN D5W 400 MG/200ML IV SOLN: 400.0000 mg | Freq: Once | INTRAVENOUS | Status: DC

## 2016-04-06 MED ORDER — HYDROMORPHONE HCL 1 MG/ML IJ SOLN
0.5000 mg | INTRAMUSCULAR | Status: DC | PRN
  Administered 2016-04-07 – 2016-04-08 (×3): 1 mg via INTRAVENOUS
  Filled 2016-04-06 (×3): qty 1

## 2016-04-06 MED ORDER — FENTANYL CITRATE (PF) 100 MCG/2ML IJ SOLN
INTRAMUSCULAR | Status: DC | PRN
  Administered 2016-04-06 (×4): 50 ug via INTRAVENOUS

## 2016-04-06 MED ORDER — MEPERIDINE HCL 25 MG/ML IJ SOLN
INTRAMUSCULAR | Status: AC
  Filled 2016-04-06: qty 1

## 2016-04-06 MED ORDER — PROPOFOL 10 MG/ML IV BOLUS
INTRAVENOUS | Status: AC
  Filled 2016-04-06: qty 20

## 2016-04-06 MED ORDER — BISACODYL 10 MG RE SUPP: 10.0000 mg | Freq: Every day | RECTAL | Status: DC | PRN

## 2016-04-06 MED ORDER — SUCCINYLCHOLINE CHLORIDE 20 MG/ML IJ SOLN
INTRAMUSCULAR | Status: DC | PRN
  Administered 2016-04-06: 100 mg via INTRAVENOUS

## 2016-04-06 MED ORDER — FENTANYL CITRATE (PF) 250 MCG/5ML IJ SOLN
INTRAMUSCULAR | Status: AC
  Filled 2016-04-06: qty 5

## 2016-04-06 MED ORDER — HYOSCYAMINE SULFATE 0.125 MG SL SUBL
0.1250 mg | SUBLINGUAL_TABLET | SUBLINGUAL | Status: DC | PRN
  Filled 2016-04-06: qty 1

## 2016-04-06 MED ORDER — OXYCODONE HCL 5 MG/5ML PO SOLN
10.0000 mg | Freq: Once | ORAL | Status: DC | PRN
  Filled 2016-04-06: qty 10

## 2016-04-06 MED ORDER — ONDANSETRON HCL 4 MG/2ML IJ SOLN
INTRAMUSCULAR | Status: DC | PRN
  Administered 2016-04-06: 4 mg via INTRAVENOUS

## 2016-04-06 MED ORDER — OXYCODONE-ACETAMINOPHEN 10-325 MG PO TABS: 1.0000 | ORAL_TABLET | Freq: Four times a day (QID) | ORAL | Status: DC | PRN

## 2016-04-06 MED ORDER — IOPAMIDOL (ISOVUE-300) INJECTION 61%
50.0000 mL | Freq: Once | INTRAVENOUS | Status: AC | PRN
  Administered 2016-04-06: 50 mL

## 2016-04-06 MED ORDER — LACTATED RINGERS IV SOLN
Freq: Once | INTRAVENOUS | Status: AC
  Administered 2016-04-06: 08:00:00 via INTRAVENOUS

## 2016-04-06 MED ORDER — MIDAZOLAM HCL 2 MG/2ML IJ SOLN
INTRAMUSCULAR | Status: AC
  Filled 2016-04-06: qty 6

## 2016-04-06 MED ORDER — SODIUM CHLORIDE 0.9 % IV SOLN: INTRAVENOUS | Status: DC

## 2016-04-06 MED ORDER — PRAVASTATIN SODIUM 40 MG PO TABS
40.0000 mg | ORAL_TABLET | Freq: Every day | ORAL | Status: DC
  Administered 2016-04-06 – 2016-04-08 (×3): 40 mg via ORAL
  Filled 2016-04-06 (×3): qty 1

## 2016-04-06 MED ORDER — ZOLPIDEM TARTRATE 5 MG PO TABS
5.0000 mg | ORAL_TABLET | Freq: Every evening | ORAL | Status: DC | PRN
  Filled 2016-04-06 (×2): qty 1

## 2016-04-06 MED ORDER — ALBUTEROL SULFATE (2.5 MG/3ML) 0.083% IN NEBU: 3.0000 mL | INHALATION_SOLUTION | Freq: Four times a day (QID) | RESPIRATORY_TRACT | Status: DC | PRN

## 2016-04-06 MED ORDER — IOPAMIDOL (ISOVUE-300) INJECTION 61%
50.0000 mL | Freq: Once | INTRAVENOUS | Status: AC | PRN
  Administered 2016-04-06: 15 mL

## 2016-04-06 MED ORDER — POLYETHYLENE GLYCOL 3350 17 G PO PACK
17.0000 g | PACK | Freq: Every day | ORAL | Status: DC | PRN
  Administered 2016-04-09: 17 g via ORAL
  Filled 2016-04-06: qty 1

## 2016-04-06 MED ORDER — LIDOCAINE HCL 1 % IJ SOLN
INTRAMUSCULAR | Status: AC
  Filled 2016-04-06: qty 40

## 2016-04-06 MED ORDER — MIDAZOLAM HCL 2 MG/2ML IJ SOLN
INTRAMUSCULAR | Status: AC | PRN
  Administered 2016-04-06 (×10): 1 mg via INTRAVENOUS

## 2016-04-06 MED ORDER — LIDOCAINE HCL 1 % IJ SOLN: INTRAMUSCULAR | Status: DC | PRN

## 2016-04-06 MED ORDER — ONDANSETRON HCL 4 MG/2ML IJ SOLN: 4.0000 mg | INTRAMUSCULAR | Status: DC | PRN

## 2016-04-06 MED ORDER — LIDOCAINE HCL 1 % IJ SOLN
INTRAMUSCULAR | Status: AC | PRN
  Administered 2016-04-06 (×2): 5 mL via INTRADERMAL

## 2016-04-06 MED ORDER — ROCURONIUM BROMIDE 100 MG/10ML IV SOLN
INTRAVENOUS | Status: DC | PRN
  Administered 2016-04-06: 50 mg via INTRAVENOUS

## 2016-04-06 MED ORDER — LIDOCAINE HCL (CARDIAC) 20 MG/ML IV SOLN
INTRAVENOUS | Status: DC | PRN
  Administered 2016-04-06: 100 mg via INTRAVENOUS

## 2016-04-06 MED ORDER — KCL IN DEXTROSE-NACL 20-5-0.45 MEQ/L-%-% IV SOLN
INTRAVENOUS | Status: DC
  Administered 2016-04-06 – 2016-04-09 (×6): via INTRAVENOUS
  Filled 2016-04-06 (×7): qty 1000

## 2016-04-06 MED ORDER — ONDANSETRON HCL 4 MG/2ML IJ SOLN: 4.0000 mg | Freq: Once | INTRAMUSCULAR | Status: DC | PRN

## 2016-04-06 MED ORDER — IOHEXOL 300 MG/ML  SOLN
INTRAMUSCULAR | Status: DC | PRN
  Administered 2016-04-06: 10 mL

## 2016-04-06 MED ORDER — ACETAMINOPHEN 325 MG PO TABS
650.0000 mg | ORAL_TABLET | ORAL | Status: DC | PRN
  Filled 2016-04-06: qty 2

## 2016-04-06 MED ORDER — FENTANYL CITRATE (PF) 100 MCG/2ML IJ SOLN
INTRAMUSCULAR | Status: AC | PRN
  Administered 2016-04-06: 25 ug via INTRAVENOUS
  Administered 2016-04-06: 50 ug via INTRAVENOUS
  Administered 2016-04-06 (×5): 25 ug via INTRAVENOUS

## 2016-04-06 MED ORDER — OXYCODONE-ACETAMINOPHEN 5-325 MG PO TABS
1.0000 | ORAL_TABLET | Freq: Four times a day (QID) | ORAL | Status: DC | PRN
  Administered 2016-04-06 – 2016-04-09 (×8): 1 via ORAL
  Filled 2016-04-06 (×8): qty 1

## 2016-04-06 MED ORDER — FENTANYL CITRATE (PF) 100 MCG/2ML IJ SOLN: 25.0000 ug | INTRAMUSCULAR | Status: DC | PRN

## 2016-04-06 MED ORDER — ONDANSETRON HCL 4 MG/2ML IJ SOLN
INTRAMUSCULAR | Status: AC
Start: 2016-04-06 — End: 2016-04-06
  Filled 2016-04-06: qty 2

## 2016-04-06 MED ORDER — ONDANSETRON HCL 4 MG/2ML IJ SOLN
INTRAMUSCULAR | Status: AC
  Filled 2016-04-06: qty 2

## 2016-04-06 MED ORDER — MIDAZOLAM HCL 2 MG/2ML IJ SOLN
INTRAMUSCULAR | Status: AC
  Filled 2016-04-06: qty 2

## 2016-04-06 MED ORDER — LACTATED RINGERS IV SOLN
INTRAVENOUS | Status: DC | PRN
  Administered 2016-04-06 (×2): via INTRAVENOUS

## 2016-04-06 MED ORDER — MEPERIDINE HCL 50 MG/ML IJ SOLN
6.2500 mg | INTRAMUSCULAR | Status: DC | PRN
  Administered 2016-04-06 (×2): 6.25 mg via INTRAVENOUS

## 2016-04-06 MED ORDER — LIDOCAINE HCL (CARDIAC) 20 MG/ML IV SOLN
INTRAVENOUS | Status: AC
  Filled 2016-04-06: qty 5

## 2016-04-06 MED ORDER — DIPHENHYDRAMINE HCL 12.5 MG/5ML PO ELIX: 12.5000 mg | ORAL_SOLUTION | Freq: Four times a day (QID) | ORAL | Status: DC | PRN

## 2016-04-06 MED ORDER — DIPHENHYDRAMINE HCL 50 MG/ML IJ SOLN: 12.5000 mg | Freq: Four times a day (QID) | INTRAMUSCULAR | Status: DC | PRN

## 2016-04-06 MED ORDER — HYDROMORPHONE HCL 1 MG/ML IJ SOLN
0.2500 mg | INTRAMUSCULAR | Status: DC | PRN
  Administered 2016-04-06 (×4): 0.5 mg via INTRAVENOUS

## 2016-04-06 MED ORDER — SUGAMMADEX SODIUM 200 MG/2ML IV SOLN
INTRAVENOUS | Status: DC | PRN
  Administered 2016-04-06: 300 mg via INTRAVENOUS

## 2016-04-06 MED ORDER — HYDROMORPHONE HCL 1 MG/ML IJ SOLN
INTRAMUSCULAR | Status: AC
  Filled 2016-04-06: qty 1

## 2016-04-06 MED ORDER — PNEUMOCOCCAL VAC POLYVALENT 25 MCG/0.5ML IJ INJ
0.5000 mL | INJECTION | INTRAMUSCULAR | Status: DC
Start: 2016-04-07 — End: 2016-04-09
  Filled 2016-04-06 (×2): qty 0.5

## 2016-04-06 MED ORDER — HYDROCODONE-ACETAMINOPHEN 7.5-325 MG PO TABS: 1.0000 | ORAL_TABLET | Freq: Once | ORAL | Status: DC | PRN

## 2016-04-06 MED ORDER — MAGNESIUM CITRATE PO SOLN: 1.0000 | Freq: Once | ORAL | Status: DC | PRN

## 2016-04-06 SURGICAL SUPPLY — 52 items
APL SKNCLS STERI-STRIP NONHPOA (GAUZE/BANDAGES/DRESSINGS) ×4
BAG URINE DRAINAGE (UROLOGICAL SUPPLIES) ×2 IMPLANT
BASKET ZERO TIP NITINOL 2.4FR (BASKET) ×2 IMPLANT
BENZOIN TINCTURE PRP APPL 2/3 (GAUZE/BANDAGES/DRESSINGS) ×8 IMPLANT
BLADE SURG 15 STRL LF DISP TIS (BLADE) ×1 IMPLANT
BLADE SURG 15 STRL SS (BLADE) ×2
BSKT STON RTRVL ZERO TP 2.4FR (BASKET) ×1
CARTRIDGE STONEBREAK CO2 KIDNE (ELECTROSURGICAL) ×1 IMPLANT
CATCHER STONE W/TUBE ADAPTER (UROLOGICAL SUPPLIES) IMPLANT
CATH AINSWORTH 30CC 24FR (CATHETERS) ×1 IMPLANT
CATH FOLEY LATEX FREE 20FR (CATHETERS) ×2
CATH FOLEY LF 20FR (CATHETERS) IMPLANT
CATH IMAGER II 65CM (CATHETERS) ×1 IMPLANT
CATH ROBINSON RED A/P 20FR (CATHETERS) IMPLANT
CATH URET 5FR 28IN OPEN ENDED (CATHETERS) IMPLANT
CATH URET DUAL LUMEN 6-10FR 50 (CATHETERS) ×1 IMPLANT
CATH X-FORCE N30 NEPHROSTOMY (TUBING) ×2 IMPLANT
COVER SURGICAL LIGHT HANDLE (MISCELLANEOUS) ×2 IMPLANT
DRAPE C-ARM 42X120 X-RAY (DRAPES) ×2 IMPLANT
DRAPE LINGEMAN PERC (DRAPES) ×2 IMPLANT
DRAPE SURG IRRIG POUCH 19X23 (DRAPES) ×2 IMPLANT
DRSG PAD ABDOMINAL 8X10 ST (GAUZE/BANDAGES/DRESSINGS) ×4 IMPLANT
DRSG TEGADERM 8X12 (GAUZE/BANDAGES/DRESSINGS) ×3 IMPLANT
FIBER LASER FLEXIVA 1000 (UROLOGICAL SUPPLIES) IMPLANT
FIBER LASER FLEXIVA 365 (UROLOGICAL SUPPLIES) IMPLANT
FIBER LASER FLEXIVA 550 (UROLOGICAL SUPPLIES) IMPLANT
FIBER LASER TRAC TIP (UROLOGICAL SUPPLIES) IMPLANT
GAUZE SPONGE 4X4 12PLY STRL (GAUZE/BANDAGES/DRESSINGS) ×2 IMPLANT
GLOVE SURG SS PI 8.0 STRL IVOR (GLOVE) ×1 IMPLANT
GOWN STRL REUS W/TWL XL LVL3 (GOWN DISPOSABLE) ×2 IMPLANT
GUIDEWIRE AMPLAZ .035X145 (WIRE) ×4 IMPLANT
KIT BASIN OR (CUSTOM PROCEDURE TRAY) ×2 IMPLANT
MANIFOLD NEPTUNE II (INSTRUMENTS) ×2 IMPLANT
MASK EYE SHIELD (GAUZE/BANDAGES/DRESSINGS) ×1 IMPLANT
NS IRRIG 1000ML POUR BTL (IV SOLUTION) ×2 IMPLANT
PACK CYSTO (CUSTOM PROCEDURE TRAY) ×2 IMPLANT
PAD ABD 8X10 STRL (GAUZE/BANDAGES/DRESSINGS) ×1 IMPLANT
PROBE KIDNEY STONEBRKR 2.0X425 (ELECTROSURGICAL) ×1 IMPLANT
PROBE LITHOCLAST ULTRA 3.8X403 (UROLOGICAL SUPPLIES) IMPLANT
PROBE PNEUMATIC 1.0MMX570MM (UROLOGICAL SUPPLIES) IMPLANT
SET IRRIG Y TYPE TUR BLADDER L (SET/KITS/TRAYS/PACK) ×2 IMPLANT
SHEATH X FORCE 10MMX22CM (SHEATH) ×1 IMPLANT
SPONGE LAP 4X18 X RAY DECT (DISPOSABLE) ×2 IMPLANT
STONE CATCHER W/TUBE ADAPTER (UROLOGICAL SUPPLIES) IMPLANT
SUT SILK 2 0 30  PSL (SUTURE) ×1
SUT SILK 2 0 30 PSL (SUTURE) ×1 IMPLANT
SYR 20CC LL (SYRINGE) ×4 IMPLANT
SYRINGE 10CC LL (SYRINGE) ×2 IMPLANT
TOWEL OR 17X26 10 PK STRL BLUE (TOWEL DISPOSABLE) ×2 IMPLANT
TOWEL OR NON WOVEN STRL DISP B (DISPOSABLE) ×2 IMPLANT
TRAY FOLEY BAG SILVER LF 16FR (CATHETERS) ×1 IMPLANT
TUBING CONNECTING 10 (TUBING) ×5 IMPLANT

## 2016-04-06 NOTE — Anesthesia Preprocedure Evaluation (Addendum)
Anesthesia Evaluation  Patient identified by MRN, date of birth, ID band Patient awake    Reviewed: Allergy & Precautions, NPO status , Patient's Chart, lab work & pertinent test results  Airway Mallampati: II  TM Distance: >3 FB Neck ROM: Full    Dental  (+) Edentulous Upper, Edentulous Lower   Pulmonary asthma , COPD,  COPD inhaler, Current Smoker,    Pulmonary exam normal breath sounds clear to auscultation       Cardiovascular + angina with exertion Normal cardiovascular exam Rhythm:Regular Rate:Normal  Cardiac Cath 02/12/2016-  1st Mrg lesion, 10% stenosed.  Prox Cx lesion, 20% stenosed.  Prox RCA lesion, 20% stenosed.  The left ventricular systolic function is normal. Normal LV function without segmental wall motion abnormalities  EKG 02/12/2016 Normal sinus rhythm Low voltage QRS Septal infarct , age undetermined   Neuro/Psych CVA, No Residual Symptoms negative psych ROS   GI/Hepatic GERD  Medicated and Controlled,(+)     substance abuse  marijuana use, Hx/o Dysphagia   Endo/Other  Obesity Hyperlipidemia  Renal/GU Renal diseaseLeft non obstructing renal calculus  negative genitourinary   Musculoskeletal negative musculoskeletal ROS (+)   Abdominal (+) + obese,   Peds  Hematology Plavix- last dose   Anesthesia Other Findings   Reproductive/Obstetrics                            Anesthesia Physical Anesthesia Plan  ASA: III  Anesthesia Plan: General   Post-op Pain Management:    Induction: Intravenous  Airway Management Planned: Oral ETT  Additional Equipment:   Intra-op Plan:   Post-operative Plan: Extubation in OR  Informed Consent: I have reviewed the patients History and Physical, chart, labs and discussed the procedure including the risks, benefits and alternatives for the proposed anesthesia with the patient or authorized representative who has indicated  his/her understanding and acceptance.   Dental advisory given  Plan Discussed with: Anesthesiologist, CRNA and Surgeon  Anesthesia Plan Comments:         Anesthesia Quick Evaluation

## 2016-04-06 NOTE — Brief Op Note (Signed)
04/06/2016  1:29 PM  PATIENT:  Craig Owens  55 y.o. male  PRE-OPERATIVE DIAGNOSIS:  LEFT RENAL STONE   POST-OPERATIVE DIAGNOSIS:  LEFT RENAL STONE   PROCEDURE:  Procedure(s): LEFT FIRST STAGE PERCUTANEOUS NEPHROLITHOTOMY (Left) >2cm stone LEFT ANTEGRADE NEPHROSTOGRAM WITH INTERPRETATION.  SURGEON:  Surgeon(s) and Role:    * Irine Seal, MD - Primary  PHYSICIAN ASSISTANT:   ASSISTANTS: none   ANESTHESIA:   general  EBL:  Total I/O In: -  Out: 225 [Urine:200; Blood:25]  BLOOD ADMINISTERED:none  DRAINS: Urinary Catheter (Foley) and 28f silastic Left NT and 675fkompe safety cath.    LOCAL MEDICATIONS USED:  NONE  SPECIMEN:  Source of Specimen:  stone fragments  DISPOSITION OF SPECIMEN:  PATHOLOGY  COUNTS:  YES  TOURNIQUET:  * No tourniquets in log *  DICTATION: .Other Dictation: Dictation Number [id number] OF CARE: Admit for overnight observation  PATIENT DISPOSITION:  PACU - hemodynamically stable.   Delay start of Pharmacological VTE agent (>24hrs) due to surgical blood loss or risk of bleeding: yes

## 2016-04-06 NOTE — OR Nursing (Signed)
Stone taken by Dr. Wrenn 

## 2016-04-06 NOTE — Progress Notes (Signed)
Patient ID: Craig Owens, male   DOB: 14-Jun-1961, 55 y.o.   MRN: [id number] He is doing well post op with expected post op pain.  His Hgb is stable at 14.6.  BP (!) 152/85 (BP Location: Right Arm)   Pulse 73   Temp 98.5 F (36.9 C)   Resp 12   Ht '6\' 1"'$  (1.854 m)   Wt 117.5 kg (259 lb)   SpO2 97%   BMI 34.17 kg/m   Urine in tubes is bloody but clearing.  Plan for right side PCNL on Thursday.  KUB in AM.

## 2016-04-06 NOTE — Procedures (Signed)
Interventional Radiology Procedure Note  Procedure:  Bilateral nephroureteral access for nephrolithotomy  Complications:  None  Estimated Blood Loss: 25 mL  Findings:  Lower pole access of left kidney with placement of 5 Fr catheter beyond lower infundibular/pelvic calculi and down ureter into bladder. Lower pole access of right kidney along superior aspect of a lower pole/infundibular calculus.  5 Fr catheter advanced down ureter into bladder. For left nephrolithotomy today with Dr. Jeffie Pollock in [place].  Craig Owens. Craig Owens, M.D Pager:  [phone]

## 2016-04-06 NOTE — Interval H&P Note (Signed)
History and Physical Interval Note:  Perc tubes were placed successfully but the kidneys are deep per Dr. Kathlene Cote.   04/06/2016 11:42 AM  Craig Owens  has presented today for surgery, with the diagnosis of LEFT RENAL STONE   The various methods of treatment have been discussed with the patient and family. After consideration of risks, benefits and other options for treatment, the patient has consented to  Procedure(s): LEFT FIRST STAGE PERCUTANEOUS NEPHROLITHOTOMY (Left) HOLMIUM LASER APPLICATION (Left) as a surgical intervention .  The patient's history has been reviewed, patient examined, no change in status, stable for surgery.  I have reviewed the patient's chart and labs.  Questions were answered to the patient's satisfaction.     [name] J

## 2016-04-06 NOTE — Transfer of Care (Signed)
Immediate Anesthesia Transfer of Care Note  Patient: Craig Owens  Procedure(s) Performed: Procedure(s): LEFT FIRST STAGE PERCUTANEOUS NEPHROLITHOTOMY (Left)  Patient Location: PACU  Anesthesia Type:General  Level of Consciousness: sedated, patient cooperative and responds to stimulation  Airway & Oxygen Therapy: Patient Spontanous Breathing and Patient connected to face mask oxygen  Post-op Assessment: Report given to RN and Post -op Vital signs reviewed and stable  Post vital signs: Reviewed and stable  Last Vitals:  Vitals:   04/06/16 0733  BP: 102/82  Pulse: 75  Resp: 18  Temp: 36.7 C    Last Pain:  Vitals:   04/06/16 0740  TempSrc:   PainSc: 7       Patients Stated Pain Goal: 3 (82/64/15 [id number])  Complications: No apparent anesthesia complications

## 2016-04-06 NOTE — Anesthesia Procedure Notes (Signed)
Procedure Name: Intubation Performed by: Gean Maidens Pre-anesthesia Checklist: Patient identified, Emergency Drugs available, Suction available, Patient being monitored and Timeout performed Patient Re-evaluated:Patient Re-evaluated prior to inductionOxygen Delivery Method: Circle system utilized Preoxygenation: Pre-oxygenation with 100% oxygen Intubation Type: IV induction Ventilation: Oral airway inserted - appropriate to patient size and Mask ventilation without difficulty Laryngoscope Size: Mac and 4 Grade View: Grade II Tube type: Oral Tube size: 7.5 mm Number of attempts: 1 Airway Equipment and Method: Stylet Placement Confirmation: ETT inserted through vocal cords under direct vision,  positive ETCO2,  CO2 detector and breath sounds checked- equal and bilateral Secured at: 23 cm Tube secured with: Tape Dental Injury: Teeth and Oropharynx as per pre-operative assessment

## 2016-04-06 NOTE — Discharge Instructions (Addendum)
Percutaneous Nephrostomy, Care After Refer to this sheet in the next few weeks. These instructions provide you with information on caring for yourself after your procedure. Your health care provider may also give you more specific instructions. Your treatment has been planned according to current medical practices, but problems sometimes occur. Call your health care provider if you have any problems or questions after your procedure. WHAT TO EXPECT AFTER THE PROCEDURE You will need to remain lying down for several hours. HOME CARE INSTRUCTIONS  Your nephrostomy tube is connected to a leg bag or bedside drainage bag. Always keep the tubing, the leg bag, or the bedside drainage bags below the level of the kidney so that the urine drains freely.  During the day, if you are connecting the nephrostomy tube to a leg bag, be sure there are no kinks in the tubing and that the urine is draining freely.  At night, you may want to connect the nephrostomy tube or the leg bag to a larger bedside drainage bag.  Change the dressing as often as directed by your health care provider, or if it becomes wet.  Gently remove the tapes and dressing from around the nephrostomy tube. Be careful not to pull on the tube while removing the dressing.  Wash the skin around the tube, rinse well, and dry.  Place two split drain sponges in and around the tube exit site.  Place tape around edge of the dressing.  Secure the nephrostomy tubing. Remember to make certain that the nephrostomy tube does not kink or become pinched closed. It can be useful to wrap any exposed tubing going from the nephrostomy tube to any of the connecting tubes to either the leg bag or drainage bag with an elastic bandage.  Every three weeks, replace the leg bag, drainage bag, and any extension tubing connected to your nephrostomy tube. Your health care provider will explain how to change the drainage bag and extension tubing. SEEK MEDICAL CARE  IF:  You experience any problems with any of the valves or tubing.  You have persistent pain or soreness in your back.  You have a fever or chills. SEEK IMMEDIATE MEDICAL CARE IF:  You have abdominal pain during the first week.  You have a new appearance of blood in your urine.  You have back pain that is not relieved by your medicine.  You have drainage, redness, swelling, or pain at the tube insertion site.  You have decreased urine output.  Your nephrostomy tube comes out.   This information is not intended to replace advice given to you by your health care provider. Make sure you discuss any questions you have with your health care provider.  You may resume plavix on Monday.   Document Released: 04/22/2004 Document Revised: 09/20/2014 Document Reviewed: 04/26/2013 Elsevier Interactive Patient Education [employer].
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
## 2016-04-07 ENCOUNTER — Observation Stay (HOSPITAL_COMMUNITY): Payer: Medicare Other

## 2016-04-07 DIAGNOSIS — N200 Calculus of kidney: Secondary | ICD-10-CM | POA: Diagnosis not present

## 2016-04-07 LAB — HEMOGLOBIN AND HEMATOCRIT, BLOOD
HEMATOCRIT: 43.8 % (ref 39.0–52.0)
Hemoglobin: 14.1 g/dL (ref 13.0–17.0)

## 2016-04-07 MED ORDER — CEFAZOLIN SODIUM-DEXTROSE 2-4 GM/100ML-% IV SOLN
2.0000 g | Freq: Once | INTRAVENOUS | Status: AC
  Administered 2016-04-08: 2 g via INTRAVENOUS

## 2016-04-07 NOTE — Anesthesia Postprocedure Evaluation (Signed)
Anesthesia Post Note  Patient: Craig Owens  Procedure(s) Performed: Procedure(s) (LRB): LEFT FIRST STAGE PERCUTANEOUS NEPHROLITHOTOMY (Left)  Patient location during evaluation: PACU Anesthesia Type: General Level of consciousness: awake and alert and oriented Pain management: pain level controlled Vital Signs Assessment: post-procedure vital signs reviewed and stable Respiratory status: spontaneous breathing, nonlabored ventilation, respiratory function stable and patient connected to nasal cannula oxygen Cardiovascular status: blood pressure returned to baseline and stable Postop Assessment: no signs of nausea or vomiting Anesthetic complications: no             Gailene Youkhana A.

## 2016-04-07 NOTE — Progress Notes (Signed)
Patient ID: Craig Owens, male   DOB: 1961/03/22, 55 y.o.   MRN: [id number] 1 Day Post-Op  Subjective: Craig Owens is doing well but has expected post op pain.   His Hgb is minimal decreased.  His urine is clearing.   The KUB today shows no residual left renal stones and the tubes and in good position.   ROS:  Review of Systems  Constitutional: Negative for chills and fever.  Gastrointestinal: Positive for abdominal pain. Negative for nausea.    Anti-infectives: Anti-infectives    Start     Dose/Rate Route Frequency Ordered Stop   04/08/16 0600  ceFAZolin (ANCEF) IVPB 2g/100 mL premix     2 g 200 mL/hr over 30 Minutes Intravenous  Once 04/07/16 0807     04/06/16 0745  ciprofloxacin (CIPRO) IVPB 400 mg  Status:  Discontinued     400 mg 200 mL/hr over 60 Minutes Intravenous  Once 04/06/16 0732 04/06/16 1519   04/06/16 0735  ceFAZolin (ANCEF) IVPB 2g/100 mL premix     2 g 200 mL/hr over 30 Minutes Intravenous 30 min pre-op 04/06/16 0735 04/06/16 1040   04/05/16 1322  ceFAZolin (ANCEF) 3 g in dextrose 5 % 50 mL IVPB  Status:  Discontinued     3 g 130 mL/hr over 30 Minutes Intravenous 30 min pre-op 04/05/16 1322 04/06/16 0737      Current Facility-Administered Medications  Medication Dose Route Frequency Provider Last Rate Last Dose  . acetaminophen (TYLENOL) tablet 650 mg  650 mg Oral Q4H PRN Irine Seal, MD      . albuterol (PROVENTIL) (2.5 MG/3ML) 0.083% nebulizer solution 3 mL  3 mL Inhalation Q6H PRN Irine Seal, MD      . bisacodyl (DULCOLAX) suppository 10 mg  10 mg Rectal Daily PRN Irine Seal, MD      . Derrill Memo ON 04/08/2016] ceFAZolin (ANCEF) IVPB 2g/100 mL premix  2 g Intravenous Once Irine Seal, MD      . dextrose 5 % and 0.45 % NaCl with KCl 20 mEq/L infusion   Intravenous Continuous Irine Seal, MD 100 mL/hr at 04/07/16 0149    . diphenhydrAMINE (BENADRYL) injection 12.5-25 mg  12.5-25 mg Intravenous Q6H PRN Irine Seal, MD       Or  . diphenhydrAMINE (BENADRYL) 12.5 MG/5ML elixir  12.5-25 mg  12.5-25 mg Oral Q6H PRN Irine Seal, MD      . HYDROmorphone (DILAUDID) injection 0.5-1 mg  0.5-1 mg Intravenous Q2H PRN Irine Seal, MD   1 mg at 04/07/16 9381  . hyoscyamine (LEVSIN SL) SL tablet 0.125 mg  0.125 mg Sublingual Q4H PRN Irine Seal, MD      . magnesium citrate solution 1 Bottle  1 Bottle Oral Once PRN Irine Seal, MD      . ondansetron [facility]) injection 4 mg  4 mg Intravenous Q4H PRN Irine Seal, MD      . oxyCODONE-acetaminophen (PERCOCET/ROXICET) 5-325 MG per tablet 1 tablet  1 tablet Oral Q6H PRN Irine Seal, MD   1 tablet at 04/07/16 0149   And  . oxyCODONE (Oxy IR/ROXICODONE) immediate release tablet 5 mg  5 mg Oral Q6H PRN Irine Seal, MD   5 mg at 04/07/16 0149  . pneumococcal 23 valent vaccine (PNU-IMMUNE) injection 0.5 mL  0.5 mL Intramuscular Tomorrow-1000 Irine Seal, MD      . polyethylene glycol (MIRALAX / GLYCOLAX) packet 17 g  17 g Oral Daily PRN Irine Seal, MD      . pravastatin (PRAVACHOL)  tablet 40 mg  40 mg Oral q1800 Irine Seal, MD   40 mg at 04/06/16 1803  . zolpidem (AMBIEN) tablet 5 mg  5 mg Oral QHS PRN,MR X 1 Irine Seal, MD         Objective: Vital signs in last 24 hours: Temp:  [98 F (36.7 C)-98.5 F (36.9 C)] 98 F (36.7 C) (07/26 0600) Pulse Rate:  [64-74] 71 (07/26 0600) Resp:  [11-28] 16 (07/26 0600) BP: (121-162)/(71-119) 123/108 (07/26 0600) SpO2:  [95 %-100 %] 97 % (07/26 0600)  Intake/Output from previous day: 07/25 0701 - 07/26 0700 In: 1451.7 [I.V.:1451.7] Out: 3075 [Urine:3050; Blood:25] Intake/Output this shift: No intake/output data recorded.   Physical Exam  Constitutional: He is well-developed, well-nourished, and in no distress.  Cardiovascular: Normal rate and regular rhythm.   Pulmonary/Chest: Effort normal and breath sounds normal. No respiratory distress.    Lab Results:   Recent Labs  04/06/16 0750 04/06/16 1423 04/07/16 0533  WBC 10.8*  --   --   HGB 14.9 14.6 14.1  HCT 45.2 45.6 43.8  PLT 215  --    --    BMET  Recent Labs  04/06/16 0750  NA 138  K 3.6  CL 109  CO2 22  GLUCOSE 143*  BUN 22*  CREATININE 1.01  CALCIUM 8.7*   PT/INR  Recent Labs  04/06/16 0750  LABPROT 13.8  INR 1.04   ABG No results for input(s): PHART, HCO3 in the last 72 hours.  Invalid input(s): PCO2, PO2  Studies/Results: Abdomen 1 View (kub)  Result Date: 04/07/2016 CLINICAL DATA:  Nephrolithiasis. EXAM: ABDOMEN - 1 VIEW COMPARISON:  October 28, 2015 FINDINGS: The patient has percutaneously placed ureteral catheters bilaterally. The tips of the respective catheters overlie the mid sacrum on each side. There are calcifications in the pelvis which appear to be of vascular etiology. The bowel gas pattern is unremarkable. No bout obstruction or free air seen. IMPRESSION: Probable vascular calcifications in the pelvis. Ureteral catheters, percutaneously placed, present bilaterally. Bowel gas pattern unremarkable. Electronically Signed   By: Lowella Grip III M.D.   On: 04/07/2016 07:18  Dg Abd 1 View  Result Date: 04/06/2016 CLINICAL DATA:  55 year old male -left percutaneous nephrostomy stone removal and stent placement. EXAM: DG C-ARM 61-120 MIN-NO REPORT; ABDOMEN - 1 VIEW COMPARISON:  Prior studies. FINDINGS: Four intraoperative views of the left abdomen are submitted postoperatively for interpretation. Views demonstrate left percutaneous nephrostomy catheter and ureteral catheter. The ureteral catheter tip terminates in the bladder, confirmed with contrast. IMPRESSION: Left percutaneous nephrostomy catheter and ureteral catheter as described. Electronically Signed   By: Margarette Canada M.D.   On: 04/06/2016 14:26  Dg C-arm 61-120 Min-no Report  Result Date: 04/06/2016 CLINICAL DATA: surgery C-ARM 61-120 MINUTES Fluoroscopy was utilized by the requesting physician.  No radiographic interpretation.   Ir Ureteral Stent Left New Access W/o Sep Nephrostomy Cath  Result Date: 04/06/2016 INDICATION:  Bilateral central renal calculi requiring percutaneous nephrolithotomy procedures. Initial percutaneous nephroureteral access is performed bilaterally prior to staged operative nephrolithotomy. EXAM: IR URETERAL STENT RIGHT NEW ACCESS W/O SEP NEPHROSTOMY CATH; IR URETERAL STENT LEFT NEW ACCESS W/O SEP NEPHROSTOMY CATH 1. LEFT PERCUTANEOUS NEPHROURETERAL CATHETER PLACEMENT 2. RIGHT PERCUTANEOUS NEPHROURETERAL CATHETER PLACEMENT COMPARISON:  None. MEDICATIONS: 2 g IV Ancef; The antibiotic was administered in an appropriate time frame prior to skin puncture. ANESTHESIA/SEDATION: Fentanyl 200 mcg IV; Versed 10.0 mg IV Moderate Sedation Time:  66 minutes The patient was continuously monitored during the  procedure by the interventional radiology nurse under my direct supervision. CONTRAST:  30 mL Isovue-300 - administered into the collecting system(s) FLUOROSCOPY TIME:  Fluoroscopy Time:  17 minutes and 54 seconds. COMPLICATIONS: None immediate. PROCEDURE: Informed written consent was obtained from the patient after a thorough discussion of the procedural risks, benefits and alternatives. All questions were addressed. Maximal Sterile Barrier Technique was utilized including caps, mask, sterile gowns, sterile gloves, sterile drape, hand hygiene and skin antiseptic. A timeout was performed prior to the initiation of the procedure. Bilateral flank regions were prepped with chlorhexidine. Local anesthesia was provided with 1% lidocaine. Ultrasound guidance was utilized during the procedure to localize the kidneys. Fluoroscopy was performed of the left kidney. Utilizing ultrasound guidance as well as fluoroscopic guidance, lower pole access of the collecting system was performed with a 21 gauge Chiba needle and contrast material injected. A guidewire was advanced into the collecting system. A transitional dilator was placed. A 5 French catheter was then advanced over a guidewire. The catheter was further advanced into the  ureter and bladder. The catheter was capped and secured at the skin with a silk retention suture. Fluoroscopy was then performed of the right kidney. Utilizing ultrasound and fluoroscopic guidance, lower pole access of the right renal collecting system was performed with a 21 gauge Chiba needle. Contrast was injected. A guidewire was advanced into the collecting system. A transitional dilator was placed. A 5 French catheter was then advanced over a wire into the ureter and down into the bladder. The catheter was capped and secured at the skin with a silk retention suture. FINDINGS: There is a dominant ovoid calculus within the left renal pelvis extending into a lower pole infundibulum. A second smaller adjacent calculus lies in the lower pole collecting system. Access on the left was performed via a lower pole calyx and was able to be directed lateral to the dominant calculus and into the renal pelvis and ureter. Access was then secured all the way to the level of the bladder. A single dominant right renal calculus is oval in shape and taller than it is wide. This lies in a lower pole infundibulum extends into the lower pole collecting system. Access could only be gained along the superior margin of this calculus into the collecting system. A 5 French catheter was then advanced all the way to the level of the bladder. IMPRESSION: Bilateral percutaneous nephroureteral access performed via lower pole access with 5 French catheters advanced beyond central calculi and into the ureter and bladder. Both catheters will be utilized for tract dilatation during operative nephrolithotomy. The left nephrolithotomy procedure is scheduled for today. Electronically Signed   By: Aletta Edouard M.D.   On: 04/06/2016 13:33  Ir Ureteral Stent Right New Access W/o Sep Nephrostomy Cath  Result Date: 04/06/2016 INDICATION: Bilateral central renal calculi requiring percutaneous nephrolithotomy procedures. Initial percutaneous  nephroureteral access is performed bilaterally prior to staged operative nephrolithotomy. EXAM: IR URETERAL STENT RIGHT NEW ACCESS W/O SEP NEPHROSTOMY CATH; IR URETERAL STENT LEFT NEW ACCESS W/O SEP NEPHROSTOMY CATH 1. LEFT PERCUTANEOUS NEPHROURETERAL CATHETER PLACEMENT 2. RIGHT PERCUTANEOUS NEPHROURETERAL CATHETER PLACEMENT COMPARISON:  None. MEDICATIONS: 2 g IV Ancef; The antibiotic was administered in an appropriate time frame prior to skin puncture. ANESTHESIA/SEDATION: Fentanyl 200 mcg IV; Versed 10.0 mg IV Moderate Sedation Time:  66 minutes The patient was continuously monitored during the procedure by the interventional radiology nurse under my direct supervision. CONTRAST:  30 mL Isovue-300 - administered into the collecting system(s)  FLUOROSCOPY TIME:  Fluoroscopy Time:  17 minutes and 54 seconds. COMPLICATIONS: None immediate. PROCEDURE: Informed written consent was obtained from the patient after a thorough discussion of the procedural risks, benefits and alternatives. All questions were addressed. Maximal Sterile Barrier Technique was utilized including caps, mask, sterile gowns, sterile gloves, sterile drape, hand hygiene and skin antiseptic. A timeout was performed prior to the initiation of the procedure. Bilateral flank regions were prepped with chlorhexidine. Local anesthesia was provided with 1% lidocaine. Ultrasound guidance was utilized during the procedure to localize the kidneys. Fluoroscopy was performed of the left kidney. Utilizing ultrasound guidance as well as fluoroscopic guidance, lower pole access of the collecting system was performed with a 21 gauge Chiba needle and contrast material injected. A guidewire was advanced into the collecting system. A transitional dilator was placed. A 5 French catheter was then advanced over a guidewire. The catheter was further advanced into the ureter and bladder. The catheter was capped and secured at the skin with a silk retention suture.  Fluoroscopy was then performed of the right kidney. Utilizing ultrasound and fluoroscopic guidance, lower pole access of the right renal collecting system was performed with a 21 gauge Chiba needle. Contrast was injected. A guidewire was advanced into the collecting system. A transitional dilator was placed. A 5 French catheter was then advanced over a wire into the ureter and down into the bladder. The catheter was capped and secured at the skin with a silk retention suture. FINDINGS: There is a dominant ovoid calculus within the left renal pelvis extending into a lower pole infundibulum. A second smaller adjacent calculus lies in the lower pole collecting system. Access on the left was performed via a lower pole calyx and was able to be directed lateral to the dominant calculus and into the renal pelvis and ureter. Access was then secured all the way to the level of the bladder. A single dominant right renal calculus is oval in shape and taller than it is wide. This lies in a lower pole infundibulum extends into the lower pole collecting system. Access could only be gained along the superior margin of this calculus into the collecting system. A 5 French catheter was then advanced all the way to the level of the bladder. IMPRESSION: Bilateral percutaneous nephroureteral access performed via lower pole access with 5 French catheters advanced beyond central calculi and into the ureter and bladder. Both catheters will be utilized for tract dilatation during operative nephrolithotomy. The left nephrolithotomy procedure is scheduled for today. Electronically Signed   By: Aletta Edouard M.D.   On: 04/06/2016 13:33    Assessment and Plan: Doing well post left PCNL.  Will proceed with right PCNL tomorrow and I will remove the left tubes at that time.        LOS: 1 day    Antwon Rochin J 04/07/2016 [phone]

## 2016-04-07 NOTE — Op Note (Signed)
NAMEMarland Kitchen  Craig Owens, Craig Owens                ACCOUNT NO.:  [id number]  MEDICAL RECORD NO.:  [id number]  LOCATION:                                 FACILITY:  PHYSICIAN:  Marshall Cork. Jeffie Pollock, M.D.    DATE OF BIRTH:  [birth year]  DATE OF PROCEDURE:  04/06/2016 DATE OF DISCHARGE:                              OPERATIVE REPORT   PROCEDURE:  First stage left percutaneous nephrolithotomy for greater than 2-cm stone.  PREOPERATIVE DIAGNOSIS:  A 24-mm and 6-mm left renal stones.  POSTOPERATIVE DIAGNOSIS:  A 24-mm and 6-mm left renal stones.  SURGEON:  Marshall Cork. Jeffie Pollock, M.D.  ANESTHESIA:  General.  SPECIMEN:  Stone fragments.  DRAINS: 1. 16-French Foley catheter. 2. 20-French silastic Foley nephrostomy tube. 3. 6-French Kumpe left nephrostomy safety catheter.  BLOOD LOSS:  Approximately 50 mL.  COMPLICATIONS:  None.  INDICATIONS:  Craig Owens is a 55 year old white male with bilateral renal stones measuring in excess of 2 cm.  The left stone has a 24-mm major stone and a 6-mm lower pole minor stone and the right side has a single approximately 24-mm stone.  He is to undergo staged percutaneous nephrolithotomy starting on the left with subsequent treatment of the right couple of days with relook on the left as needed.  He underwent placement of bilateral percutaneous nephrostomy tubes by Dr. Kathlene Cote earlier today and received Ancef at that time.  FINDINGS AND PROCEDURE:  He was taken to the operating room where a general anesthetic was placed on the holding room stretcher.  His genitalia was prepped and a Foley catheter was placed.  He was then rolled prone on chest rolls with care taken to pad all pressure points and his left nephrostomy tube site was prepped with ChloraPrep and draped in the usual sterile fashion.  The previously placed nephrostomy tube was re-wired with an Amplatz superstiff wire and the nephrostomy tube was removed.  The skin incision was increased to 2 cm with the knife and  a dual-lumen catheter was then passed over the working wire into the proximal ureter. There was resistance at the level of the stone with some tension on the wire, I was able to get it safely into the ureter.  The second superstiff wire was then passed to the bladder through the dual-lumen catheter, which was then removed.  The NephroMax nephrostomy Trek balloon was then passed over the working wire until the tip was at the level of the stone and the balloon was inflated to 20 atmospheres.  The extended length sheath was then positioned in the renal pelvis and the balloon was deflated and removed leaving the working wire in place.  At this point, the rigid nephroscope was then passed and the stone after some minor clots were removed was visualized in the renal pelvis.  The stone was engaged with the [facility] and broken into manageable fragments, which were then removed using the two-jaw grasper.  Once all major fragments had been removed, I converted to a flexible nephroscope using pressure bag and additional small fragments in the pelvis were removed as well as a 6-mm stone in the lower pole.  Once all of these fragments had  been removed and all that was left was tiny sand. The rigid scope was replaced into the renal pelvis.  The couple of more fragments were noted here and removed with the 2-jaw grasper.  The sheath was then repositioned into the pelvis that had been backed out little bit to facilitate access to the lower pole and the nephroscope was removed.  A 20-French silastic Foley catheter was repaired using a catheter punch and was placed over the wire into the renal pelvis.  Some contrast was instilled with the wire in place and the tube was in good position.  The nephrostomy sheath was then backed out.  There was some bleeding during this process, it was a long sheath and had to be cut away sequentially to be removed over the catheter and then, a 6-French  Kumpe catheter was placed over the safety wire to just above the bladder and the safety wire was removed.  Pressure on the incision site produced hemostasis in a rapid fashion. Once hemostasis was secured, the skin was closed using a 2-0 silk suture, which was then used to tether the safety catheter and the nephrostomy catheter.  The nephrostomy catheter balloon was filled with 3 mL of sterile fluid.  Repeat antegrade nephrostogram was performed with Omnipaque and saline 50:50.  The antegrade nephrostogram revealed no renal pelvic extravasation.  The collecting system filled and there was good antegrade flow.  At this point, the safety catheter was capped and the nephrostomy catheter was placed to straight drainage.  The drapes were removed and the dressing was applied.  The patient was then rolled supine on the recovery room stretcher.  His anesthetic was reversed and he was moved to the recovery room in stable condition.  There were no complications.     Marshall Cork. Jeffie Pollock, M.D.   ______________________________ Marshall Cork. Jeffie Pollock, M.D.    JJW/MEDQ  D:  04/06/2016  T:  04/07/2016  Job:  [id number]

## 2016-04-08 ENCOUNTER — Encounter (HOSPITAL_COMMUNITY): Payer: Self-pay | Admitting: Urology

## 2016-04-08 ENCOUNTER — Encounter (HOSPITAL_COMMUNITY)
Admission: RE | Disposition: A | Discharge status: Home / Self Care | Payer: Self-pay | Source: Ambulatory Visit | Attending: Urology

## 2016-04-08 ENCOUNTER — Observation Stay (HOSPITAL_COMMUNITY): Payer: Medicare Other | Admitting: Anesthesiology

## 2016-04-08 ENCOUNTER — Observation Stay (HOSPITAL_COMMUNITY): Payer: Medicare Other

## 2016-04-08 ENCOUNTER — Ambulatory Visit (HOSPITAL_COMMUNITY): Admission: RE | Admit: 2016-04-08 | Payer: Medicare HMO | Source: Ambulatory Visit | Admitting: Urology

## 2016-04-08 DIAGNOSIS — N200 Calculus of kidney: Secondary | ICD-10-CM | POA: Diagnosis not present

## 2016-04-08 HISTORY — PX: NEPHROLITHOTOMY: SHX5134

## 2016-04-08 SURGERY — NEPHROLITHOTOMY PERCUTANEOUS
Anesthesia: General | Site: Back | Laterality: Right

## 2016-04-08 MED ORDER — OXYCODONE HCL 5 MG/5ML PO SOLN
5.0000 mg | Freq: Once | ORAL | Status: DC | PRN
Start: 2016-04-08 — End: 2016-04-08
  Filled 2016-04-08: qty 5

## 2016-04-08 MED ORDER — DEXAMETHASONE SODIUM PHOSPHATE 10 MG/ML IJ SOLN
INTRAMUSCULAR | Status: DC | PRN
  Administered 2016-04-08: 10 mg via INTRAVENOUS

## 2016-04-08 MED ORDER — SODIUM CHLORIDE 0.9 % IJ SOLN
INTRAMUSCULAR | Status: AC
  Filled 2016-04-08: qty 10

## 2016-04-08 MED ORDER — LACTATED RINGERS IV SOLN
INTRAVENOUS | Status: DC | PRN
  Administered 2016-04-08: 07:00:00 via INTRAVENOUS

## 2016-04-08 MED ORDER — ONDANSETRON HCL 4 MG/2ML IJ SOLN
INTRAMUSCULAR | Status: DC | PRN
  Administered 2016-04-08: 4 mg via INTRAVENOUS

## 2016-04-08 MED ORDER — OXYCODONE HCL 5 MG PO TABS: 5.0000 mg | ORAL_TABLET | Freq: Once | ORAL | Status: DC | PRN

## 2016-04-08 MED ORDER — FENTANYL CITRATE (PF) 100 MCG/2ML IJ SOLN
INTRAMUSCULAR | Status: DC | PRN
  Administered 2016-04-08: 25 ug via INTRAVENOUS
  Administered 2016-04-08: 50 ug via INTRAVENOUS
  Administered 2016-04-08: 100 ug via INTRAVENOUS
  Administered 2016-04-08: 25 ug via INTRAVENOUS
  Administered 2016-04-08: 50 ug via INTRAVENOUS

## 2016-04-08 MED ORDER — FENTANYL CITRATE (PF) 250 MCG/5ML IJ SOLN
INTRAMUSCULAR | Status: AC
  Filled 2016-04-08: qty 5

## 2016-04-08 MED ORDER — MIDAZOLAM HCL 5 MG/5ML IJ SOLN
INTRAMUSCULAR | Status: DC | PRN
  Administered 2016-04-08: 2 mg via INTRAVENOUS

## 2016-04-08 MED ORDER — IOHEXOL 300 MG/ML  SOLN
INTRAMUSCULAR | Status: DC | PRN
Start: 2016-04-08 — End: 2016-04-08
  Administered 2016-04-08: 50 mL

## 2016-04-08 MED ORDER — CEFAZOLIN SODIUM-DEXTROSE 2-4 GM/100ML-% IV SOLN
INTRAVENOUS | Status: AC
  Filled 2016-04-08: qty 100

## 2016-04-08 MED ORDER — ROCURONIUM BROMIDE 100 MG/10ML IV SOLN
INTRAVENOUS | Status: DC | PRN
  Administered 2016-04-08: 50 mg via INTRAVENOUS
  Administered 2016-04-08 (×2): 10 mg via INTRAVENOUS

## 2016-04-08 MED ORDER — LIDOCAINE HCL (CARDIAC) 20 MG/ML IV SOLN
INTRAVENOUS | Status: DC | PRN
  Administered 2016-04-08: 100 mg via INTRAVENOUS

## 2016-04-08 MED ORDER — PROPOFOL 10 MG/ML IV BOLUS
INTRAVENOUS | Status: DC | PRN
  Administered 2016-04-08: 200 mg via INTRAVENOUS

## 2016-04-08 MED ORDER — FENTANYL CITRATE (PF) 100 MCG/2ML IJ SOLN
25.0000 ug | INTRAMUSCULAR | Status: DC | PRN
  Administered 2016-04-08: 25 ug via INTRAVENOUS

## 2016-04-08 MED ORDER — SUGAMMADEX SODIUM 200 MG/2ML IV SOLN
INTRAVENOUS | Status: DC | PRN
  Administered 2016-04-08: 200 mg via INTRAVENOUS

## 2016-04-08 MED ORDER — SUGAMMADEX SODIUM 200 MG/2ML IV SOLN
INTRAVENOUS | Status: AC
Start: 2016-04-08 — End: 2016-04-08
  Filled 2016-04-08: qty 2

## 2016-04-08 MED ORDER — 0.9 % SODIUM CHLORIDE (POUR BTL) OPTIME
TOPICAL | Status: DC | PRN
  Administered 2016-04-08: 1000 mL

## 2016-04-08 MED ORDER — ONDANSETRON HCL 4 MG/2ML IJ SOLN
INTRAMUSCULAR | Status: AC
  Filled 2016-04-08: qty 2

## 2016-04-08 MED ORDER — SODIUM CHLORIDE 0.9 % IR SOLN
Status: DC | PRN
  Administered 2016-04-08: 12000 mL

## 2016-04-08 MED ORDER — FENTANYL CITRATE (PF) 100 MCG/2ML IJ SOLN
INTRAMUSCULAR | Status: AC
  Filled 2016-04-08: qty 2

## 2016-04-08 MED ORDER — EPHEDRINE SULFATE 50 MG/ML IJ SOLN
INTRAMUSCULAR | Status: AC
Start: 2016-04-08 — End: 2016-04-08
  Filled 2016-04-08: qty 1

## 2016-04-08 MED ORDER — MIDAZOLAM HCL 2 MG/2ML IJ SOLN
INTRAMUSCULAR | Status: AC
  Filled 2016-04-08: qty 2

## 2016-04-08 MED ORDER — PROPOFOL 10 MG/ML IV BOLUS
INTRAVENOUS | Status: AC
  Filled 2016-04-08: qty 20

## 2016-04-08 MED ORDER — DEXAMETHASONE SODIUM PHOSPHATE 10 MG/ML IJ SOLN
INTRAMUSCULAR | Status: AC
Start: 2016-04-08 — End: 2016-04-08
  Filled 2016-04-08: qty 1

## 2016-04-08 MED ORDER — PHENYLEPHRINE 40 MCG/ML (10ML) SYRINGE FOR IV PUSH (FOR BLOOD PRESSURE SUPPORT)
PREFILLED_SYRINGE | INTRAVENOUS | Status: AC
  Filled 2016-04-08: qty 10

## 2016-04-08 MED ORDER — PHENYLEPHRINE HCL 10 MG/ML IJ SOLN
INTRAMUSCULAR | Status: DC | PRN
  Administered 2016-04-08: 40 ug via INTRAVENOUS

## 2016-04-08 MED ORDER — ROCURONIUM BROMIDE 100 MG/10ML IV SOLN
INTRAVENOUS | Status: AC
Start: 2016-04-08 — End: 2016-04-08
  Filled 2016-04-08: qty 1

## 2016-04-08 MED ORDER — LIDOCAINE HCL (CARDIAC) 20 MG/ML IV SOLN
INTRAVENOUS | Status: AC
  Filled 2016-04-08: qty 5

## 2016-04-08 MED ORDER — ACETAMINOPHEN 160 MG/5ML PO SOLN: 325.0000 mg | ORAL | Status: DC | PRN

## 2016-04-08 MED ORDER — ACETAMINOPHEN 325 MG PO TABS: 325.0000 mg | ORAL_TABLET | ORAL | Status: DC | PRN

## 2016-04-08 SURGICAL SUPPLY — 55 items
APL SKNCLS STERI-STRIP NONHPOA (GAUZE/BANDAGES/DRESSINGS) ×6
BAG URINE DRAINAGE (UROLOGICAL SUPPLIES) ×1 IMPLANT
BASKET ZERO TIP NITINOL 2.4FR (BASKET) ×1 IMPLANT
BENZOIN TINCTURE PRP APPL 2/3 (GAUZE/BANDAGES/DRESSINGS) ×12 IMPLANT
BLADE SURG 15 STRL LF DISP TIS (BLADE) ×3 IMPLANT
BLADE SURG 15 STRL SS (BLADE) ×5
BSKT STON RTRVL ZERO TP 2.4FR (BASKET)
CARTRIDGE STONEBREAK CO2 KIDNE (ELECTROSURGICAL) ×4 IMPLANT
CATCHER STONE W/TUBE ADAPTER (UROLOGICAL SUPPLIES) IMPLANT
CATH AINSWORTH 30CC 24FR (CATHETERS) ×5 IMPLANT
CATH FOLEY 2W COUNCIL 20FR 5CC (CATHETERS) IMPLANT
CATH IMAGER II 65CM (CATHETERS) ×4 IMPLANT
CATH ROBINSON RED A/P 20FR (CATHETERS) IMPLANT
CATH URET 5FR 28IN OPEN ENDED (CATHETERS) IMPLANT
CATH URET DUAL LUMEN 6-10FR 50 (CATHETERS) ×1 IMPLANT
CATH X-FORCE N30 NEPHROSTOMY (TUBING) ×5 IMPLANT
COVER SURGICAL LIGHT HANDLE (MISCELLANEOUS) ×5 IMPLANT
DRAPE C-ARM 42X120 X-RAY (DRAPES) ×5 IMPLANT
DRAPE LINGEMAN PERC (DRAPES) ×5 IMPLANT
DRAPE SURG IRRIG POUCH 19X23 (DRAPES) ×5 IMPLANT
DRSG PAD ABDOMINAL 8X10 ST (GAUZE/BANDAGES/DRESSINGS) ×10 IMPLANT
DRSG TEGADERM 8X12 (GAUZE/BANDAGES/DRESSINGS) ×6 IMPLANT
EXTRACTOR STONE NITINOL NGAGE (UROLOGICAL SUPPLIES) ×4 IMPLANT
FIBER LASER FLEXIVA 1000 (UROLOGICAL SUPPLIES) IMPLANT
FIBER LASER FLEXIVA 365 (UROLOGICAL SUPPLIES) IMPLANT
FIBER LASER FLEXIVA 550 (UROLOGICAL SUPPLIES) IMPLANT
FIBER LASER TRAC TIP (UROLOGICAL SUPPLIES) IMPLANT
GAUZE SPONGE 4X4 12PLY STRL (GAUZE/BANDAGES/DRESSINGS) ×5 IMPLANT
GLOVE SURG SS PI 8.0 STRL IVOR (GLOVE) IMPLANT
GOWN STRL REUS W/TWL XL LVL3 (GOWN DISPOSABLE) ×5 IMPLANT
GUIDEWIRE AMPLAZ .035X145 (WIRE) ×10 IMPLANT
KIT BASIN OR (CUSTOM PROCEDURE TRAY) ×5 IMPLANT
MANIFOLD NEPTUNE II (INSTRUMENTS) ×5 IMPLANT
MASK EYE SHIELD (GAUZE/BANDAGES/DRESSINGS) ×5 IMPLANT
NS IRRIG 1000ML POUR BTL (IV SOLUTION) ×5 IMPLANT
PACK BASIC VI WITH GOWN DISP (CUSTOM PROCEDURE TRAY) ×5 IMPLANT
PACK CYSTO (CUSTOM PROCEDURE TRAY) ×5 IMPLANT
PAD ABD 8X10 STRL (GAUZE/BANDAGES/DRESSINGS) ×4 IMPLANT
PROBE KIDNEY STONEBRKR 2.0X425 (ELECTROSURGICAL) ×4 IMPLANT
PROBE LITHOCLAST ULTRA 3.8X403 (UROLOGICAL SUPPLIES) IMPLANT
PROBE PNEUMATIC 1.0MMX570MM (UROLOGICAL SUPPLIES) IMPLANT
SET IRRIG Y TYPE TUR BLADDER L (SET/KITS/TRAYS/PACK) ×5 IMPLANT
SHEATH X FORCE 10MMX22CM (SHEATH) ×4 IMPLANT
SPONGE LAP 4X18 X RAY DECT (DISPOSABLE) ×5 IMPLANT
STONE CATCHER W/TUBE ADAPTER (UROLOGICAL SUPPLIES) IMPLANT
SUT SILK 2 0 30  PSL (SUTURE) ×2
SUT SILK 2 0 30 PSL (SUTURE) ×3 IMPLANT
SYR 20CC LL (SYRINGE) ×10 IMPLANT
SYRINGE 10CC LL (SYRINGE) ×5 IMPLANT
TOWEL OR 17X26 10 PK STRL BLUE (TOWEL DISPOSABLE) ×5 IMPLANT
TOWEL OR NON WOVEN STRL DISP B (DISPOSABLE) ×5 IMPLANT
TRAY FOLEY W/METER SILVER 14FR (SET/KITS/TRAYS/PACK) ×1 IMPLANT
TRAY FOLEY W/METER SILVER 16FR (SET/KITS/TRAYS/PACK) ×5 IMPLANT
TUBING CONNECTING 10 (TUBING) ×12 IMPLANT
TUBING CONNECTING 10' (TUBING) ×3

## 2016-04-08 NOTE — Anesthesia Postprocedure Evaluation (Signed)
Anesthesia Post Note  Patient: Craig Owens  Procedure(s) Performed: Procedure(s) (LRB): FIRST STAGE RIGHT PERCUTANEOUS NEPHROLITHOTOMY  antegrade nephrostagram removal left nephrostomy tube (Right)  Patient location during evaluation: PACU Anesthesia Type: General Level of consciousness: awake Pain management: pain level controlled Vital Signs Assessment: post-procedure vital signs reviewed and stable Respiratory status: spontaneous breathing Cardiovascular status: stable Postop Assessment: no signs of nausea or vomiting Anesthetic complications: no    Last Vitals:  Vitals:   04/08/16 1000 04/08/16 1021  BP:  (!) 158/90  Pulse:  78  Resp:  15  Temp: 36.7 C 37.2 C    Last Pain:  Vitals:   04/08/16 1724  TempSrc:   PainSc: 5                  Hiram Mciver

## 2016-04-08 NOTE — H&P (View-Only) (Signed)
Patient ID: OCIE TINO, male   DOB: 12-06-1960, 55 y.o.   MRN: [id number] 1 Day Post-Op  Subjective: Rian is doing well but has expected post op pain.   His Hgb is minimal decreased.  His urine is clearing.   The KUB today shows no residual left renal stones and the tubes and in good position.   ROS:  Review of Systems  Constitutional: Negative for chills and fever.  Gastrointestinal: Positive for abdominal pain. Negative for nausea.    Anti-infectives: Anti-infectives    Start     Dose/Rate Route Frequency Ordered Stop   04/08/16 0600  ceFAZolin (ANCEF) IVPB 2g/100 mL premix     2 g 200 mL/hr over 30 Minutes Intravenous  Once 04/07/16 0807     04/06/16 0745  ciprofloxacin (CIPRO) IVPB 400 mg  Status:  Discontinued     400 mg 200 mL/hr over 60 Minutes Intravenous  Once 04/06/16 0732 04/06/16 1519   04/06/16 0735  ceFAZolin (ANCEF) IVPB 2g/100 mL premix     2 g 200 mL/hr over 30 Minutes Intravenous 30 min pre-op 04/06/16 0735 04/06/16 1040   04/05/16 1322  ceFAZolin (ANCEF) 3 g in dextrose 5 % 50 mL IVPB  Status:  Discontinued     3 g 130 mL/hr over 30 Minutes Intravenous 30 min pre-op 04/05/16 1322 04/06/16 0737      Current Facility-Administered Medications  Medication Dose Route Frequency Provider Last Rate Last Dose  . acetaminophen (TYLENOL) tablet 650 mg  650 mg Oral Q4H PRN Irine Seal, MD      . albuterol (PROVENTIL) (2.5 MG/3ML) 0.083% nebulizer solution 3 mL  3 mL Inhalation Q6H PRN Irine Seal, MD      . bisacodyl (DULCOLAX) suppository 10 mg  10 mg Rectal Daily PRN Irine Seal, MD      . Derrill Memo ON 04/08/2016] ceFAZolin (ANCEF) IVPB 2g/100 mL premix  2 g Intravenous Once Irine Seal, MD      . dextrose 5 % and 0.45 % NaCl with KCl 20 mEq/L infusion   Intravenous Continuous Irine Seal, MD 100 mL/hr at 04/07/16 0149    . diphenhydrAMINE (BENADRYL) injection 12.5-25 mg  12.5-25 mg Intravenous Q6H PRN Irine Seal, MD       Or  . diphenhydrAMINE (BENADRYL) 12.5 MG/5ML elixir  12.5-25 mg  12.5-25 mg Oral Q6H PRN Irine Seal, MD      . HYDROmorphone (DILAUDID) injection 0.5-1 mg  0.5-1 mg Intravenous Q2H PRN Irine Seal, MD   1 mg at 04/07/16 8101  . hyoscyamine (LEVSIN SL) SL tablet 0.125 mg  0.125 mg Sublingual Q4H PRN Irine Seal, MD      . magnesium citrate solution 1 Bottle  1 Bottle Oral Once PRN Irine Seal, MD      . ondansetron [facility]) injection 4 mg  4 mg Intravenous Q4H PRN Irine Seal, MD      . oxyCODONE-acetaminophen (PERCOCET/ROXICET) 5-325 MG per tablet 1 tablet  1 tablet Oral Q6H PRN Irine Seal, MD   1 tablet at 04/07/16 0149   And  . oxyCODONE (Oxy IR/ROXICODONE) immediate release tablet 5 mg  5 mg Oral Q6H PRN Irine Seal, MD   5 mg at 04/07/16 0149  . pneumococcal 23 valent vaccine (PNU-IMMUNE) injection 0.5 mL  0.5 mL Intramuscular Tomorrow-1000 Irine Seal, MD      . polyethylene glycol (MIRALAX / GLYCOLAX) packet 17 g  17 g Oral Daily PRN Irine Seal, MD      . pravastatin (PRAVACHOL)  tablet 40 mg  40 mg Oral q1800 Irine Seal, MD   40 mg at 04/06/16 1803  . zolpidem (AMBIEN) tablet 5 mg  5 mg Oral QHS PRN,MR X 1 Irine Seal, MD         Objective: Vital signs in last 24 hours: Temp:  [98 F (36.7 C)-98.5 F (36.9 C)] 98 F (36.7 C) (07/26 0600) Pulse Rate:  [64-74] 71 (07/26 0600) Resp:  [11-28] 16 (07/26 0600) BP: (121-162)/(71-119) 123/108 (07/26 0600) SpO2:  [95 %-100 %] 97 % (07/26 0600)  Intake/Output from previous day: 07/25 0701 - 07/26 0700 In: 1451.7 [I.V.:1451.7] Out: 3075 [Urine:3050; Blood:25] Intake/Output this shift: No intake/output data recorded.   Physical Exam  Constitutional: He is well-developed, well-nourished, and in no distress.  Cardiovascular: Normal rate and regular rhythm.   Pulmonary/Chest: Effort normal and breath sounds normal. No respiratory distress.    Lab Results:   Recent Labs  04/06/16 0750 04/06/16 1423 04/07/16 0533  WBC 10.8*  --   --   HGB 14.9 14.6 14.1  HCT 45.2 45.6 43.8  PLT 215  --    --    BMET  Recent Labs  04/06/16 0750  NA 138  K 3.6  CL 109  CO2 22  GLUCOSE 143*  BUN 22*  CREATININE 1.01  CALCIUM 8.7*   PT/INR  Recent Labs  04/06/16 0750  LABPROT 13.8  INR 1.04   ABG No results for input(s): PHART, HCO3 in the last 72 hours.  Invalid input(s): PCO2, PO2  Studies/Results: Abdomen 1 View (kub)  Result Date: 04/07/2016 CLINICAL DATA:  Nephrolithiasis. EXAM: ABDOMEN - 1 VIEW COMPARISON:  October 28, 2015 FINDINGS: The patient has percutaneously placed ureteral catheters bilaterally. The tips of the respective catheters overlie the mid sacrum on each side. There are calcifications in the pelvis which appear to be of vascular etiology. The bowel gas pattern is unremarkable. No bout obstruction or free air seen. IMPRESSION: Probable vascular calcifications in the pelvis. Ureteral catheters, percutaneously placed, present bilaterally. Bowel gas pattern unremarkable. Electronically Signed   By: Lowella Grip III M.D.   On: 04/07/2016 07:18  Dg Abd 1 View  Result Date: 04/06/2016 CLINICAL DATA:  55 year old male -left percutaneous nephrostomy stone removal and stent placement. EXAM: DG C-ARM 61-120 MIN-NO REPORT; ABDOMEN - 1 VIEW COMPARISON:  Prior studies. FINDINGS: Four intraoperative views of the left abdomen are submitted postoperatively for interpretation. Views demonstrate left percutaneous nephrostomy catheter and ureteral catheter. The ureteral catheter tip terminates in the bladder, confirmed with contrast. IMPRESSION: Left percutaneous nephrostomy catheter and ureteral catheter as described. Electronically Signed   By: Margarette Canada M.D.   On: 04/06/2016 14:26  Dg C-arm 61-120 Min-no Report  Result Date: 04/06/2016 CLINICAL DATA: surgery C-ARM 61-120 MINUTES Fluoroscopy was utilized by the requesting physician.  No radiographic interpretation.   Ir Ureteral Stent Left New Access W/o Sep Nephrostomy Cath  Result Date: 04/06/2016 INDICATION:  Bilateral central renal calculi requiring percutaneous nephrolithotomy procedures. Initial percutaneous nephroureteral access is performed bilaterally prior to staged operative nephrolithotomy. EXAM: IR URETERAL STENT RIGHT NEW ACCESS W/O SEP NEPHROSTOMY CATH; IR URETERAL STENT LEFT NEW ACCESS W/O SEP NEPHROSTOMY CATH 1. LEFT PERCUTANEOUS NEPHROURETERAL CATHETER PLACEMENT 2. RIGHT PERCUTANEOUS NEPHROURETERAL CATHETER PLACEMENT COMPARISON:  None. MEDICATIONS: 2 g IV Ancef; The antibiotic was administered in an appropriate time frame prior to skin puncture. ANESTHESIA/SEDATION: Fentanyl 200 mcg IV; Versed 10.0 mg IV Moderate Sedation Time:  66 minutes The patient was continuously monitored during the  procedure by the interventional radiology nurse under my direct supervision. CONTRAST:  30 mL Isovue-300 - administered into the collecting system(s) FLUOROSCOPY TIME:  Fluoroscopy Time:  17 minutes and 54 seconds. COMPLICATIONS: None immediate. PROCEDURE: Informed written consent was obtained from the patient after a thorough discussion of the procedural risks, benefits and alternatives. All questions were addressed. Maximal Sterile Barrier Technique was utilized including caps, mask, sterile gowns, sterile gloves, sterile drape, hand hygiene and skin antiseptic. A timeout was performed prior to the initiation of the procedure. Bilateral flank regions were prepped with chlorhexidine. Local anesthesia was provided with 1% lidocaine. Ultrasound guidance was utilized during the procedure to localize the kidneys. Fluoroscopy was performed of the left kidney. Utilizing ultrasound guidance as well as fluoroscopic guidance, lower pole access of the collecting system was performed with a 21 gauge Chiba needle and contrast material injected. A guidewire was advanced into the collecting system. A transitional dilator was placed. A 5 French catheter was then advanced over a guidewire. The catheter was further advanced into the  ureter and bladder. The catheter was capped and secured at the skin with a silk retention suture. Fluoroscopy was then performed of the right kidney. Utilizing ultrasound and fluoroscopic guidance, lower pole access of the right renal collecting system was performed with a 21 gauge Chiba needle. Contrast was injected. A guidewire was advanced into the collecting system. A transitional dilator was placed. A 5 French catheter was then advanced over a wire into the ureter and down into the bladder. The catheter was capped and secured at the skin with a silk retention suture. FINDINGS: There is a dominant ovoid calculus within the left renal pelvis extending into a lower pole infundibulum. A second smaller adjacent calculus lies in the lower pole collecting system. Access on the left was performed via a lower pole calyx and was able to be directed lateral to the dominant calculus and into the renal pelvis and ureter. Access was then secured all the way to the level of the bladder. A single dominant right renal calculus is oval in shape and taller than it is wide. This lies in a lower pole infundibulum extends into the lower pole collecting system. Access could only be gained along the superior margin of this calculus into the collecting system. A 5 French catheter was then advanced all the way to the level of the bladder. IMPRESSION: Bilateral percutaneous nephroureteral access performed via lower pole access with 5 French catheters advanced beyond central calculi and into the ureter and bladder. Both catheters will be utilized for tract dilatation during operative nephrolithotomy. The left nephrolithotomy procedure is scheduled for today. Electronically Signed   By: Aletta Edouard M.D.   On: 04/06/2016 13:33  Ir Ureteral Stent Right New Access W/o Sep Nephrostomy Cath  Result Date: 04/06/2016 INDICATION: Bilateral central renal calculi requiring percutaneous nephrolithotomy procedures. Initial percutaneous  nephroureteral access is performed bilaterally prior to staged operative nephrolithotomy. EXAM: IR URETERAL STENT RIGHT NEW ACCESS W/O SEP NEPHROSTOMY CATH; IR URETERAL STENT LEFT NEW ACCESS W/O SEP NEPHROSTOMY CATH 1. LEFT PERCUTANEOUS NEPHROURETERAL CATHETER PLACEMENT 2. RIGHT PERCUTANEOUS NEPHROURETERAL CATHETER PLACEMENT COMPARISON:  None. MEDICATIONS: 2 g IV Ancef; The antibiotic was administered in an appropriate time frame prior to skin puncture. ANESTHESIA/SEDATION: Fentanyl 200 mcg IV; Versed 10.0 mg IV Moderate Sedation Time:  66 minutes The patient was continuously monitored during the procedure by the interventional radiology nurse under my direct supervision. CONTRAST:  30 mL Isovue-300 - administered into the collecting system(s)  FLUOROSCOPY TIME:  Fluoroscopy Time:  17 minutes and 54 seconds. COMPLICATIONS: None immediate. PROCEDURE: Informed written consent was obtained from the patient after a thorough discussion of the procedural risks, benefits and alternatives. All questions were addressed. Maximal Sterile Barrier Technique was utilized including caps, mask, sterile gowns, sterile gloves, sterile drape, hand hygiene and skin antiseptic. A timeout was performed prior to the initiation of the procedure. Bilateral flank regions were prepped with chlorhexidine. Local anesthesia was provided with 1% lidocaine. Ultrasound guidance was utilized during the procedure to localize the kidneys. Fluoroscopy was performed of the left kidney. Utilizing ultrasound guidance as well as fluoroscopic guidance, lower pole access of the collecting system was performed with a 21 gauge Chiba needle and contrast material injected. A guidewire was advanced into the collecting system. A transitional dilator was placed. A 5 French catheter was then advanced over a guidewire. The catheter was further advanced into the ureter and bladder. The catheter was capped and secured at the skin with a silk retention suture.  Fluoroscopy was then performed of the right kidney. Utilizing ultrasound and fluoroscopic guidance, lower pole access of the right renal collecting system was performed with a 21 gauge Chiba needle. Contrast was injected. A guidewire was advanced into the collecting system. A transitional dilator was placed. A 5 French catheter was then advanced over a wire into the ureter and down into the bladder. The catheter was capped and secured at the skin with a silk retention suture. FINDINGS: There is a dominant ovoid calculus within the left renal pelvis extending into a lower pole infundibulum. A second smaller adjacent calculus lies in the lower pole collecting system. Access on the left was performed via a lower pole calyx and was able to be directed lateral to the dominant calculus and into the renal pelvis and ureter. Access was then secured all the way to the level of the bladder. A single dominant right renal calculus is oval in shape and taller than it is wide. This lies in a lower pole infundibulum extends into the lower pole collecting system. Access could only be gained along the superior margin of this calculus into the collecting system. A 5 French catheter was then advanced all the way to the level of the bladder. IMPRESSION: Bilateral percutaneous nephroureteral access performed via lower pole access with 5 French catheters advanced beyond central calculi and into the ureter and bladder. Both catheters will be utilized for tract dilatation during operative nephrolithotomy. The left nephrolithotomy procedure is scheduled for today. Electronically Signed   By: Aletta Edouard M.D.   On: 04/06/2016 13:33    Assessment and Plan: Doing well post left PCNL.  Will proceed with right PCNL tomorrow and I will remove the left tubes at that time.        LOS: 1 day    Mysha Peeler J 04/07/[phone]

## 2016-04-08 NOTE — Brief Op Note (Signed)
04/06/2016 - 04/08/2016  9:23 AM  PATIENT:  Nicholes Rough  55 y.o. male  PRE-OPERATIVE DIAGNOSIS:  right renal and left renal stone   POST-OPERATIVE DIAGNOSIS:  right renal and left renal stone  PROCEDURE:  Procedure(s): FIRST STAGE RIGHT PERCUTANEOUS NEPHROLITHOTOMY  antegrade nephrostagram removal left nephrostomy tube (Right)  SURGEON:  Surgeon(s) and Role:    * Irine Seal, MD - Primary  PHYSICIAN ASSISTANT:   ASSISTANTS: none   ANESTHESIA:   general  EBL:  Total I/O In: -  Out: 600 [Urine:600]  BLOOD ADMINISTERED:none  DRAINS: Urinary Catheter (Foley) and 20 fr right nephrostomy tube and 51f Kompe cath.   LOCAL MEDICATIONS USED:  NONE  SPECIMEN:  Source of Specimen:  stone fragments  DISPOSITION OF SPECIMEN:  to patient  COUNTS:  YES  TOURNIQUET:  * No tourniquets in log *  DICTATION: .Other Dictation: Dictation Number [id number] PLAN OF CARE: Admit for overnight observation  PATIENT DISPOSITION:  PACU - hemodynamically stable.   Delay start of Pharmacological VTE agent (>24hrs) due to surgical blood loss or risk of bleeding: yes

## 2016-04-08 NOTE — Transfer of Care (Signed)
Immediate Anesthesia Transfer of Care Note  Patient: Craig Owens  Procedure(s) Performed: Procedure(s): FIRST STAGE RIGHT PERCUTANEOUS NEPHROLITHOTOMY  antegrade nephrostagram removal left nephrostomy tube (Right)  Patient Location: PACU  Anesthesia Type:General  Level of Consciousness: awake, alert  and oriented  Airway & Oxygen Therapy: Patient Spontanous Breathing and Patient connected to face mask oxygen  Post-op Assessment: Report given to RN and Post -op Vital signs reviewed and stable  Post vital signs: Reviewed and stable  Last Vitals:  Vitals:   04/07/16 2142 04/08/16 0427  BP: (!) 144/77 134/90  Pulse: 71 74  Resp: 20 18  Temp: 36.9 C 36.9 C    Last Pain:  Vitals:   04/08/16 0427  TempSrc: Oral  PainSc:       Patients Stated Pain Goal: 3 (11/94/17 4081)  Complications: No apparent anesthesia complications

## 2016-04-08 NOTE — Interval H&P Note (Signed)
History and Physical Interval Note: He is doing well with clearing urine and a stable Hgb.   04/08/2016 7:19 AM  Craig Owens  has presented today for surgery, with the diagnosis of right renal and left renal stone   The various methods of treatment have been discussed with the patient and family. After consideration of risks, benefits and other options for treatment, the patient has consented to  Procedure(s): FIRST STAGE RIGHT PERCUTANEOUS NEPHROLITHOTOMY (Right) POSSIBLE SECOND STAGE LEFT PERCUTANEOUS NEPHROLITHOTOMY (Left) HOLMIUM LASER APPLICATION (N/A) as a surgical intervention .  The patient's history has been reviewed, patient examined, no change in status, stable for surgery.  I have reviewed the patient's chart and labs.  Questions were answered to the patient's satisfaction.     Vastie Douty J

## 2016-04-08 NOTE — Anesthesia Preprocedure Evaluation (Addendum)
Anesthesia Evaluation  Patient identified by MRN, date of birth, ID band Patient awake    Reviewed: Allergy & Precautions, NPO status , Patient's Chart, lab work & pertinent test results  Airway Mallampati: II  TM Distance: >3 FB Neck ROM: Full    Dental  (+) Edentulous Upper, Edentulous Lower   Pulmonary asthma , COPD, Current Smoker,    breath sounds clear to auscultation       Cardiovascular + angina (-) CAD, (-) Past MI and (-) CHF  Rhythm:Regular     Neuro/Psych CVA, No Residual Symptoms negative psych ROS   GI/Hepatic Neg liver ROS, GERD  ,  Endo/Other  Morbid obesity  Renal/GU Renal disease     Musculoskeletal negative musculoskeletal ROS (+)   Abdominal   Peds  Hematology negative hematology ROS (+)   Anesthesia Other Findings   Reproductive/Obstetrics                             Anesthesia Physical Anesthesia Plan  ASA: III  Anesthesia Plan: General   Post-op Pain Management:    Induction: Intravenous  Airway Management Planned: Oral ETT  Additional Equipment: None  Intra-op Plan:   Post-operative Plan: Extubation in OR  Informed Consent: I have reviewed the patients History and Physical, chart, labs and discussed the procedure including the risks, benefits and alternatives for the proposed anesthesia with the patient or authorized representative who has indicated his/her understanding and acceptance.   Dental advisory given  Plan Discussed with: CRNA and Surgeon  Anesthesia Plan Comments:         Anesthesia Quick Evaluation

## 2016-04-08 NOTE — Anesthesia Procedure Notes (Signed)
Procedure Name: Intubation Date/Time: 04/08/2016 7:48 AM Performed by: Glory Buff Pre-anesthesia Checklist: Patient identified, Emergency Drugs available, Suction available and Patient being monitored Patient Re-evaluated:Patient Re-evaluated prior to inductionOxygen Delivery Method: Circle system utilized Preoxygenation: Pre-oxygenation with 100% oxygen Intubation Type: IV induction Ventilation: Mask ventilation without difficulty Laryngoscope Size: Miller and 3 Grade View: Grade I Tube type: Oral Tube size: 7.5 mm Number of attempts: 1 Airway Equipment and Method: Stylet and Oral airway Placement Confirmation: ETT inserted through vocal cords under direct vision,  positive ETCO2 and breath sounds checked- equal and bilateral Secured at: 21 cm Tube secured with: Tape Dental Injury: Teeth and Oropharynx as per pre-operative assessment

## 2016-04-08 NOTE — Progress Notes (Signed)
Patient ID: Craig Owens, male   DOB: 1961-02-16, 55 y.o.   MRN: [id number] He is doing well post right PCNL and is not leaking from his left tube site.  BP (!) 158/90 (BP Location: Right Arm)   Pulse 78   Temp 98.9 F (37.2 C)   Resp 15   Ht '6\' 1"'$  (1.854 m)   Wt 117.5 kg (259 lb)   SpO2 94%   BMI 34.17 kg/m    Plan for KUB in am and tube out if clear.   Home tomorrow.

## 2016-04-09 ENCOUNTER — Observation Stay (HOSPITAL_COMMUNITY): Payer: Medicare Other

## 2016-04-09 DIAGNOSIS — N200 Calculus of kidney: Secondary | ICD-10-CM | POA: Diagnosis not present

## 2016-04-09 MED ORDER — OXYCODONE-ACETAMINOPHEN 10-325 MG PO TABS: 1.0000 | ORAL_TABLET | Freq: Four times a day (QID) | ORAL | 0 refills | Status: DC | PRN
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
## 2016-04-09 NOTE — Op Note (Signed)
NAMEMarland Kitchen  BARAN, KUHRT                ACCOUNT NO.:  [id number]  MEDICAL RECORD NO.:  [id number]  LOCATION:                                 FACILITY:  PHYSICIAN:  Marshall Cork. Jeffie Pollock, M.D.    DATE OF BIRTH:  [birth year]  DATE OF PROCEDURE:  04/08/2016 DATE OF DISCHARGE:                              OPERATIVE REPORT   PATIENT OF:  Marshall Cork. Jeffie Pollock, M.D.  PROCEDURE: 1. First-stage right percutaneous nephrolithotomy for greater than 2     cm stones. 2. Right antegrade nephrostogram through existing tube. 3. Removal of left percutaneous nephrostomy tube.  PREOPERATIVE DIAGNOSIS:  A 2.4 cm right renal stone.  POSTOPERATIVE DIAGNOSIS:  A 2.4 cm right renal stone.  SURGEON:  Marshall Cork. Jeffie Pollock, M.D.  ANESTHESIA:  General.  SPECIMENS:  Stone fragments.  DRAINS:  Foley catheter.  A 20-French silastic right nephrostomy tube and 6-French Kumpe right nephrostomy tube.  ESTIMATED BLOOD LOSS:  50 mL.  SPECIMENS:  Stone fragments.  COMPLICATIONS:  None.  INDICATIONS:  Mr. Christiana is a 55 year old white male with a history of bilateral greater than 2 cm renal stones.  He underwent previous left percutaneous nephrolithotomy on Tuesday, and postop KUB revealed a stone free state.  He has a 2.4 cm right renal pelvic stone that is being addressed today.  His percutaneous tube was placed on Tuesday.  FINDINGS OF PROCEDURE:  The patient was given Ancef.  He was taken to the operating room where general anesthetic was induced on the holding room stretcher.  He had a Foley catheter in place already.  He was rolled prone on chest rolls with care taken to pad all pressure points.  His right nephrostomy site was then prepped with ChloraPrep and draped in the usual sterile fashion.  A guidewire was placed through the nephrostomy tube to the bladder, and the nephrostomy tube was removed.  A 2 cm skin incision was made to extend the nephrostomy tube puncture.  A double-lumen catheter was then passed over the  working wire into the proximal ureter, and a second Amplatz Super Stiff wire was then placed. I was not able to advance the dual-lumen catheter very far because of the tight passage by the stone, but I was able to get it sufficiently far to negotiate the second wire to the bladder.  The dual-lumen catheter was then removed, and a NephroMax balloon was then passed over the working wire until the tip was at the level of the stone.  It was then inflated to 20 atmospheres, and the extended length sheath was then advanced over the balloon into the area of the renal pelvis.  A rigid nephroscope was then passed, and initial inspection revealed that the tip of the sheath was not sufficiently far to the renal pelvis to access the stone.  This was actually confirmed by the use of the flexible scope which I could pass into the renal pelvis and identify the stone in the anterior calyx.  I then replaced the balloon, and this required actually two attempts deeper into the renal pelvis and advance the sheath further; and eventually, I was able to get it sufficiently into the pelvis to  allow access to the stone with the rigid scope.  The stone was then fragmented using the [facility], and the fragments were then removed using the two jaw grasper.  At this point, the flexible scope was reinserted and some additional residual small stones were identified and were removed with the NGage basket.  Once all significant stone fragments were removed, inspection revealed only fine dust, and no other significant fragments were noted with fluoroscopy.  At this point, a 20-French silastic Foley catheter was inserted over the working wire to the renal pelvis.  Contrast was instilled to ensure adequate placement, and the nephrostomy sheath was then backed out.  I had to shorten it prior to complete removal; but once the sheath was out to the skin level, a 6-French Kumpe catheter was passed over the  safety wire to just above the bladder.  That wire was removed, and the Kumpe catheter was capped.  The nephrostomy site was partially closed with a 2- 0 silk suture, and the tubes were then secured to the skin using the same 2-0 silk suture.  The sheath was then removed from the catheter, and a final antegrade nephrostogram was performed.  This revealed good placement of the nephrostomy tube into the renal pelvis.  The collecting system filled without filling defects, and there was antegrade flow with extravasation only along the tract.  At this point, the nephrostomy tube was placed to a drainage bag.  The drapes were removed, and a dressing was applied.  I then removed the dressing from the previously placed left nephrostomy tube and removed both the nephrostomy catheter and safety catheter. This area was dressed as well.  The patient was then rolled back supine on the holding room stretcher. His anesthetic was reversed, and he was moved to the recovery room in stable condition.  There were no complications.     Marshall Cork. Jeffie Pollock, M.D.   ______________________________ Marshall Cork. Jeffie Pollock, M.D.    JJW/MEDQ  D:  04/08/2016  T:  04/08/2016  Job:  [id number]

## 2016-04-09 NOTE — Care Management Note (Signed)
Case Management Note  Patient Details  Name: Craig Owens MRN: [id number] Date of Birth: 09/05/1961  Subjective/Objective:   55 y/o m admitted w/Nephrolithiasis. From home. No CM needs.                 Action/Plan:d/c home.   Expected Discharge Date:                  Expected Discharge Plan:  Home/Self Care  In-House Referral:     Discharge planning Services  CM Consult  Post Acute Care Choice:    Choice offered to:     DME Arranged:    DME Agency:     HH Arranged:    [place] Agency:     Status of Service:  Completed, signed off  If discussed at [employer] of Stay Meetings, dates discussed:    Additional Comments:  Dessa Phi, RN 04/09/2016, 9:58 AM

## 2016-04-09 NOTE — Progress Notes (Signed)
Pt states that he cannot read.  Teach back performed with d/c instructions.  Pt able to verbalize d/c instructions back to RN.

## 2016-04-09 NOTE — Care Management Obs Status (Signed)
[place] NOTIFICATION   Patient Details  Name: Craig Owens MRN: [id number] Date of Birth: 07-Dec-1960   Medicare Observation Status Notification Given:  Yes    MahabirJuliann Pulse, RN 04/09/2016, 9:58 AM

## 2016-04-10 LAB — TYPE AND SCREEN
ABO/RH(D): A POS
Antibody Screen: POSITIVE
DONOR AG TYPE: NEGATIVE
UNIT DIVISION: 0

## 2016-04-13 ENCOUNTER — Encounter (HOSPITAL_COMMUNITY): Payer: Self-pay | Admitting: Urology

## 2016-04-23 ENCOUNTER — Other Ambulatory Visit (HOSPITAL_COMMUNITY)
Admission: RE | Admit: 2016-04-23 | Discharge: 2016-04-23 | Disposition: A | Discharge status: Home / Self Care | Payer: Medicare HMO | Source: Ambulatory Visit | Attending: Urology | Admitting: Urology

## 2016-04-23 ENCOUNTER — Ambulatory Visit (INDEPENDENT_AMBULATORY_CARE_PROVIDER_SITE_OTHER): Payer: Self-pay | Admitting: Urology

## 2016-04-23 DIAGNOSIS — N200 Calculus of kidney: Secondary | ICD-10-CM | POA: Diagnosis present

## 2016-04-23 LAB — URINE MICROSCOPIC-ADD ON: SQUAMOUS EPITHELIAL / LPF: NONE SEEN

## 2016-04-23 LAB — URINALYSIS, ROUTINE W REFLEX MICROSCOPIC
BILIRUBIN URINE: NEGATIVE
Glucose, UA: NEGATIVE mg/dL
KETONES UR: NEGATIVE mg/dL
NITRITE: NEGATIVE
PH: 6 (ref 5.0–8.0)
PROTEIN: 30 mg/dL — AB
Specific Gravity, Urine: 1.025 (ref 1.005–1.030)

## 2016-04-24 LAB — URINE CULTURE

## 2016-04-30 ENCOUNTER — Other Ambulatory Visit: Payer: Self-pay | Admitting: Urology

## 2016-04-30 DIAGNOSIS — N200 Calculus of kidney: Secondary | ICD-10-CM

## 2016-05-24 ENCOUNTER — Ambulatory Visit (HOSPITAL_COMMUNITY)
Admission: RE | Admit: 2016-05-24 | Discharge: 2016-05-24 | Disposition: A | Discharge status: Home / Self Care | Payer: Medicare HMO | Source: Ambulatory Visit | Attending: Urology | Admitting: Urology

## 2016-05-24 DIAGNOSIS — N200 Calculus of kidney: Secondary | ICD-10-CM | POA: Insufficient documentation

## 2016-05-24 DIAGNOSIS — N1330 Unspecified hydronephrosis: Secondary | ICD-10-CM | POA: Insufficient documentation

## 2016-07-23 ENCOUNTER — Other Ambulatory Visit: Payer: Self-pay | Admitting: Urology

## 2016-07-23 DIAGNOSIS — N200 Calculus of kidney: Secondary | ICD-10-CM

## 2016-09-17 ENCOUNTER — Other Ambulatory Visit (HOSPITAL_COMMUNITY)
Admission: RE | Admit: 2016-09-17 | Discharge: 2016-09-17 | Disposition: A | Discharge status: Home / Self Care | Payer: Medicare HMO | Source: Ambulatory Visit | Attending: Urology | Admitting: Urology

## 2016-09-17 ENCOUNTER — Ambulatory Visit (INDEPENDENT_AMBULATORY_CARE_PROVIDER_SITE_OTHER): Payer: Medicare HMO | Admitting: Urology

## 2016-09-17 DIAGNOSIS — N200 Calculus of kidney: Secondary | ICD-10-CM | POA: Diagnosis not present

## 2016-09-17 DIAGNOSIS — R3121 Asymptomatic microscopic hematuria: Secondary | ICD-10-CM

## 2016-09-17 DIAGNOSIS — R81 Glycosuria: Secondary | ICD-10-CM

## 2016-09-17 LAB — URINALYSIS, COMPLETE (UACMP) WITH MICROSCOPIC
BACTERIA UA: NONE SEEN
Bilirubin Urine: NEGATIVE
Glucose, UA: >=500 mg/dL — AB
Ketones, ur: NEGATIVE mg/dL
Leukocytes, UA: NEGATIVE
Nitrite: NEGATIVE
PH: 6 (ref 5.0–8.0)
Protein, ur: 30 mg/dL — AB
SPECIFIC GRAVITY, URINE: 1.021 (ref 1.005–1.030)

## 2016-09-21 ENCOUNTER — Other Ambulatory Visit: Payer: Self-pay | Admitting: Urology

## 2016-09-21 DIAGNOSIS — N200 Calculus of kidney: Secondary | ICD-10-CM

## 2016-09-21 DIAGNOSIS — R3121 Asymptomatic microscopic hematuria: Secondary | ICD-10-CM

## 2016-09-24 ENCOUNTER — Ambulatory Visit (HOSPITAL_COMMUNITY)
Admission: RE | Admit: 2016-09-24 | Discharge: 2016-09-24 | Disposition: A | Discharge status: Home / Self Care | Payer: Medicare HMO | Source: Ambulatory Visit | Attending: Urology | Admitting: Urology

## 2016-09-24 ENCOUNTER — Encounter (HOSPITAL_COMMUNITY): Payer: Self-pay

## 2016-09-24 ENCOUNTER — Other Ambulatory Visit: Payer: Self-pay | Admitting: Urology

## 2016-09-24 DIAGNOSIS — N200 Calculus of kidney: Secondary | ICD-10-CM

## 2016-09-24 DIAGNOSIS — R3121 Asymptomatic microscopic hematuria: Secondary | ICD-10-CM

## 2016-10-18 ENCOUNTER — Ambulatory Visit (HOSPITAL_COMMUNITY)
Admission: RE | Admit: 2016-10-18 | Discharge: 2016-10-18 | Disposition: A | Discharge status: Home / Self Care | Payer: Medicare HMO | Source: Ambulatory Visit | Attending: Urology | Admitting: Urology

## 2016-10-18 DIAGNOSIS — Z87442 Personal history of urinary calculi: Secondary | ICD-10-CM | POA: Diagnosis not present

## 2017-01-20 ENCOUNTER — Other Ambulatory Visit (HOSPITAL_COMMUNITY)
Admission: RE | Admit: 2017-01-20 | Discharge: 2017-01-20 | Disposition: A | Discharge status: Home / Self Care | Payer: Medicare HMO | Source: Ambulatory Visit | Attending: Family Medicine | Admitting: Family Medicine

## 2017-01-20 DIAGNOSIS — I110 Hypertensive heart disease with heart failure: Secondary | ICD-10-CM | POA: Insufficient documentation

## 2017-01-20 DIAGNOSIS — E784 Other hyperlipidemia: Secondary | ICD-10-CM | POA: Insufficient documentation

## 2017-01-20 DIAGNOSIS — D510 Vitamin B12 deficiency anemia due to intrinsic factor deficiency: Secondary | ICD-10-CM | POA: Diagnosis not present

## 2017-01-20 DIAGNOSIS — E1165 Type 2 diabetes mellitus with hyperglycemia: Secondary | ICD-10-CM | POA: Insufficient documentation

## 2017-01-20 DIAGNOSIS — E559 Vitamin D deficiency, unspecified: Secondary | ICD-10-CM | POA: Diagnosis not present

## 2017-01-20 DIAGNOSIS — R5383 Other fatigue: Secondary | ICD-10-CM | POA: Diagnosis not present

## 2017-01-20 LAB — CBC
HCT: 44 % (ref 39.0–52.0)
Hemoglobin: 15.4 g/dL (ref 13.0–17.0)
MCH: 29.9 pg (ref 26.0–34.0)
MCHC: 35 g/dL (ref 30.0–36.0)
MCV: 85.4 fL (ref 78.0–100.0)
Platelets: 182 10*3/uL (ref 150–400)
RBC: 5.15 MIL/uL (ref 4.22–5.81)
RDW: 13.2 % (ref 11.5–15.5)
WBC: 9 10*3/uL (ref 4.0–10.5)

## 2017-01-20 LAB — COMPREHENSIVE METABOLIC PANEL
ALT: 32 U/L (ref 17–63)
ANION GAP: 8 (ref 5–15)
AST: 26 U/L (ref 15–41)
Albumin: 3.5 g/dL (ref 3.5–5.0)
Alkaline Phosphatase: 91 U/L (ref 38–126)
BUN: 15 mg/dL (ref 6–20)
CHLORIDE: 99 mmol/L — AB (ref 101–111)
CO2: 23 mmol/L (ref 22–32)
CREATININE: 0.86 mg/dL (ref 0.61–1.24)
Calcium: 9.8 mg/dL (ref 8.9–10.3)
GFR calc Af Amer: >60 mL/min (ref >60)
Glucose, Bld: 452 mg/dL — ABNORMAL HIGH (ref 65–99)
POTASSIUM: 3.8 mmol/L (ref 3.5–5.1)
SODIUM: 130 mmol/L — AB (ref 135–145)
Total Bilirubin: 0.5 mg/dL (ref 0.3–1.2)
Total Protein: 6.9 g/dL (ref 6.5–8.1)

## 2017-01-20 LAB — LIPID PANEL
CHOLESTEROL: 307 mg/dL — AB (ref 0–200)
LDL CALC: UNDETERMINED mg/dL (ref 0–99)
TRIGLYCERIDES: 1770 mg/dL — AB (ref <150)
VLDL: UNDETERMINED mg/dL (ref 0–40)

## 2017-01-20 LAB — T4, FREE: Free T4: 0.79 ng/dL (ref 0.61–1.12)

## 2017-01-20 LAB — TSH: TSH: 0.78 u[IU]/mL (ref 0.350–4.500)

## 2017-01-21 LAB — HEMOGLOBIN A1C
Hgb A1c MFr Bld: 14.2 % — ABNORMAL HIGH (ref 4.8–5.6)
Mean Plasma Glucose: 361 mg/dL

## 2017-01-21 LAB — VITAMIN D 25 HYDROXY (VIT D DEFICIENCY, FRACTURES): Vit D, 25-Hydroxy: 11.7 ng/mL — ABNORMAL LOW (ref 30.0–100.0)

## 2017-01-21 LAB — FOLLICLE STIMULATING HORMONE: FSH: 5.4 m[IU]/mL (ref 1.5–12.4)

## 2017-03-18 ENCOUNTER — Other Ambulatory Visit (HOSPITAL_COMMUNITY)
Admission: RE | Admit: 2017-03-18 | Discharge: 2017-03-18 | Disposition: A | Discharge status: Home / Self Care | Payer: Medicare HMO | Source: Other Acute Inpatient Hospital | Attending: Urology | Admitting: Urology

## 2017-03-18 ENCOUNTER — Other Ambulatory Visit: Payer: Self-pay | Admitting: Urology

## 2017-03-18 ENCOUNTER — Ambulatory Visit (INDEPENDENT_AMBULATORY_CARE_PROVIDER_SITE_OTHER): Payer: Medicare HMO | Admitting: Urology

## 2017-03-18 DIAGNOSIS — N200 Calculus of kidney: Secondary | ICD-10-CM | POA: Diagnosis not present

## 2017-03-18 DIAGNOSIS — R3121 Asymptomatic microscopic hematuria: Secondary | ICD-10-CM | POA: Insufficient documentation

## 2017-03-18 DIAGNOSIS — Z8551 Personal history of malignant neoplasm of bladder: Secondary | ICD-10-CM | POA: Diagnosis not present

## 2017-03-18 LAB — URINALYSIS, COMPLETE (UACMP) WITH MICROSCOPIC
Bacteria, UA: NONE SEEN
Bilirubin Urine: NEGATIVE
Glucose, UA: >=500 mg/dL — AB
Ketones, ur: NEGATIVE mg/dL
Leukocytes, UA: NEGATIVE
Nitrite: NEGATIVE
PROTEIN: 30 mg/dL — AB
SQUAMOUS EPITHELIAL / LPF: NONE SEEN
Specific Gravity, Urine: 1.035 — ABNORMAL HIGH (ref 1.005–1.030)
pH: 5 (ref 5.0–8.0)

## 2017-03-21 ENCOUNTER — Ambulatory Visit (HOSPITAL_COMMUNITY)
Admission: RE | Admit: 2017-03-21 | Discharge: 2017-03-21 | Disposition: A | Discharge status: Home / Self Care | Payer: Medicare HMO | Source: Ambulatory Visit | Attending: Urology | Admitting: Urology

## 2017-03-21 DIAGNOSIS — N1330 Unspecified hydronephrosis: Secondary | ICD-10-CM | POA: Insufficient documentation

## 2017-03-21 DIAGNOSIS — N200 Calculus of kidney: Secondary | ICD-10-CM | POA: Diagnosis present

## 2017-06-24 ENCOUNTER — Other Ambulatory Visit (HOSPITAL_COMMUNITY)
Admission: RE | Admit: 2017-06-24 | Discharge: 2017-06-24 | Disposition: A | Discharge status: Home / Self Care | Payer: Medicare HMO | Source: Ambulatory Visit | Attending: Urology | Admitting: Urology

## 2017-06-24 ENCOUNTER — Ambulatory Visit (INDEPENDENT_AMBULATORY_CARE_PROVIDER_SITE_OTHER): Payer: Medicare HMO | Admitting: Urology

## 2017-06-24 ENCOUNTER — Other Ambulatory Visit (HOSPITAL_COMMUNITY)
Admission: RE | Admit: 2017-06-24 | Discharge: 2017-06-24 | Disposition: A | Discharge status: Home / Self Care | Payer: Medicare HMO | Source: Other Acute Inpatient Hospital | Attending: Urology | Admitting: Urology

## 2017-06-24 DIAGNOSIS — Z8551 Personal history of malignant neoplasm of bladder: Secondary | ICD-10-CM | POA: Insufficient documentation

## 2017-06-24 DIAGNOSIS — R3121 Asymptomatic microscopic hematuria: Secondary | ICD-10-CM

## 2017-06-24 DIAGNOSIS — N200 Calculus of kidney: Secondary | ICD-10-CM | POA: Diagnosis not present

## 2017-06-24 LAB — URINALYSIS, COMPLETE (UACMP) WITH MICROSCOPIC
BILIRUBIN URINE: NEGATIVE
Bacteria, UA: NONE SEEN
Glucose, UA: 50 mg/dL — AB
KETONES UR: NEGATIVE mg/dL
LEUKOCYTES UA: NEGATIVE
NITRITE: NEGATIVE
Protein, ur: 30 mg/dL — AB
SPECIFIC GRAVITY, URINE: 1.016 (ref 1.005–1.030)
pH: 5 (ref 5.0–8.0)

## 2017-06-28 ENCOUNTER — Other Ambulatory Visit: Payer: Self-pay | Admitting: Urology

## 2017-06-28 ENCOUNTER — Ambulatory Visit (INDEPENDENT_AMBULATORY_CARE_PROVIDER_SITE_OTHER): Payer: Medicare HMO | Admitting: General Surgery

## 2017-06-28 ENCOUNTER — Encounter: Payer: Self-pay | Admitting: General Surgery

## 2017-06-28 ENCOUNTER — Ambulatory Visit: Payer: Medicare HMO | Admitting: General Surgery

## 2017-06-28 VITALS — BP 105/70 | HR 81 | Temp 98.9°F | Resp 18 | Ht 73.0 in | Wt 255.0 lb

## 2017-06-28 DIAGNOSIS — T148XXA Other injury of unspecified body region, initial encounter: Secondary | ICD-10-CM | POA: Diagnosis not present

## 2017-06-28 DIAGNOSIS — R3121 Asymptomatic microscopic hematuria: Secondary | ICD-10-CM
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
## 2017-07-08 ENCOUNTER — Ambulatory Visit (HOSPITAL_COMMUNITY): Payer: Medicare HMO

## 2017-09-07 ENCOUNTER — Other Ambulatory Visit: Payer: Self-pay | Admitting: Urology

## 2017-09-07 DIAGNOSIS — N200 Calculus of kidney: Secondary | ICD-10-CM

## 2017-09-12 ENCOUNTER — Encounter: Payer: Self-pay | Admitting: Radiology

## 2017-09-12 ENCOUNTER — Ambulatory Visit (HOSPITAL_COMMUNITY)
Admission: RE | Admit: 2017-09-12 | Discharge: 2017-09-12 | Disposition: A | Discharge status: Home / Self Care | Payer: Medicare HMO | Source: Ambulatory Visit | Attending: Urology | Admitting: Urology

## 2017-09-12 DIAGNOSIS — N200 Calculus of kidney: Secondary | ICD-10-CM | POA: Insufficient documentation

## 2017-09-20 ENCOUNTER — Other Ambulatory Visit: Payer: Self-pay | Admitting: Urology

## 2017-09-20 DIAGNOSIS — R3129 Other microscopic hematuria: Secondary | ICD-10-CM

## 2017-09-20 DIAGNOSIS — Z8551 Personal history of malignant neoplasm of bladder: Secondary | ICD-10-CM

## 2017-09-20 DIAGNOSIS — N200 Calculus of kidney: Secondary | ICD-10-CM

## 2017-09-29 ENCOUNTER — Ambulatory Visit (HOSPITAL_COMMUNITY)
Admission: RE | Admit: 2017-09-29 | Discharge: 2017-09-29 | Disposition: A | Discharge status: Home / Self Care | Payer: Medicare HMO | Source: Ambulatory Visit | Attending: Urology | Admitting: Urology

## 2017-09-29 DIAGNOSIS — R3121 Asymptomatic microscopic hematuria: Secondary | ICD-10-CM | POA: Diagnosis present

## 2017-09-29 DIAGNOSIS — N200 Calculus of kidney: Secondary | ICD-10-CM

## 2017-09-29 DIAGNOSIS — K760 Fatty (change of) liver, not elsewhere classified: Secondary | ICD-10-CM | POA: Diagnosis not present

## 2017-09-29 DIAGNOSIS — R3129 Other microscopic hematuria: Secondary | ICD-10-CM

## 2017-09-29 DIAGNOSIS — Z8551 Personal history of malignant neoplasm of bladder: Secondary | ICD-10-CM | POA: Diagnosis present

## 2017-09-29 LAB — POCT I-STAT CREATININE: Creatinine, Ser: 1.1 mg/dL (ref 0.61–1.24)

## 2017-09-29 MED ORDER — IOPAMIDOL (ISOVUE-300) INJECTION 61%
125.0000 mL | Freq: Once | INTRAVENOUS | Status: AC | PRN
  Administered 2017-09-29: 125 mL via INTRAVENOUS

## 2017-09-30 ENCOUNTER — Other Ambulatory Visit: Payer: Self-pay | Admitting: Urology

## 2017-09-30 DIAGNOSIS — N200 Calculus of kidney: Secondary | ICD-10-CM

## 2017-11-24 ENCOUNTER — Other Ambulatory Visit (HOSPITAL_COMMUNITY): Payer: Self-pay | Admitting: Family Medicine

## 2017-11-24 DIAGNOSIS — M545 Low back pain: Secondary | ICD-10-CM

## 2017-11-24 DIAGNOSIS — S0540XA Penetrating wound of orbit with or without foreign body, unspecified eye, initial encounter: Secondary | ICD-10-CM

## 2017-11-29 ENCOUNTER — Encounter (HOSPITAL_COMMUNITY): Payer: Self-pay

## 2017-11-29 ENCOUNTER — Ambulatory Visit (HOSPITAL_COMMUNITY)
Admission: RE | Admit: 2017-11-29 | Discharge: 2017-11-29 | Disposition: A | Discharge status: Home / Self Care | Payer: Medicare HMO | Source: Ambulatory Visit | Attending: Family Medicine | Admitting: Family Medicine

## 2017-11-29 DIAGNOSIS — S0540XA Penetrating wound of orbit with or without foreign body, unspecified eye, initial encounter: Secondary | ICD-10-CM

## 2017-11-29 DIAGNOSIS — Z1811 Retained magnetic metal fragments: Secondary | ICD-10-CM | POA: Diagnosis not present

## 2017-11-29 DIAGNOSIS — M545 Low back pain: Secondary | ICD-10-CM

## 2017-12-23 ENCOUNTER — Ambulatory Visit (INDEPENDENT_AMBULATORY_CARE_PROVIDER_SITE_OTHER): Payer: Medicare HMO | Admitting: Urology

## 2017-12-23 DIAGNOSIS — Z8551 Personal history of malignant neoplasm of bladder: Secondary | ICD-10-CM | POA: Diagnosis not present

## 2017-12-23 DIAGNOSIS — N200 Calculus of kidney: Secondary | ICD-10-CM

## 2017-12-23 DIAGNOSIS — R3121 Asymptomatic microscopic hematuria: Secondary | ICD-10-CM

## 2018-01-24 ENCOUNTER — Other Ambulatory Visit (HOSPITAL_COMMUNITY): Payer: Self-pay | Admitting: Family Medicine

## 2018-01-24 DIAGNOSIS — M545 Low back pain: Secondary | ICD-10-CM

## 2018-02-09 ENCOUNTER — Ambulatory Visit (HOSPITAL_COMMUNITY)
Admission: RE | Admit: 2018-02-09 | Discharge: 2018-02-09 | Disposition: A | Discharge status: Home / Self Care | Payer: Medicare HMO | Source: Ambulatory Visit | Attending: Family Medicine | Admitting: Family Medicine

## 2018-02-09 DIAGNOSIS — M545 Low back pain: Secondary | ICD-10-CM | POA: Diagnosis present

## 2018-02-09 DIAGNOSIS — I700 Atherosclerosis of aorta: Secondary | ICD-10-CM | POA: Diagnosis not present

## 2018-02-09 DIAGNOSIS — N200 Calculus of kidney: Secondary | ICD-10-CM | POA: Diagnosis not present

## 2018-02-09 DIAGNOSIS — M5136 Other intervertebral disc degeneration, lumbar region: Secondary | ICD-10-CM | POA: Insufficient documentation

## 2018-02-09 DIAGNOSIS — M5126 Other intervertebral disc displacement, lumbar region: Secondary | ICD-10-CM | POA: Diagnosis not present

## 2018-02-09 LAB — POCT I-STAT CREATININE: Creatinine, Ser: 1.4 mg/dL — ABNORMAL HIGH (ref 0.61–1.24)

## 2018-02-09 MED ORDER — IOPAMIDOL (ISOVUE-300) INJECTION 61%
100.0000 mL | Freq: Once | INTRAVENOUS | Status: AC | PRN
  Administered 2018-02-09: 100 mL via INTRAVENOUS
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
## 2018-03-09 ENCOUNTER — Other Ambulatory Visit (HOSPITAL_COMMUNITY): Payer: Self-pay | Admitting: Physician Assistant

## 2018-03-09 DIAGNOSIS — G8929 Other chronic pain: Secondary | ICD-10-CM

## 2018-03-09 DIAGNOSIS — M5442 Lumbago with sciatica, left side: Principal | ICD-10-CM

## 2018-03-09 DIAGNOSIS — M5441 Lumbago with sciatica, right side: Principal | ICD-10-CM
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
## 2018-03-21 ENCOUNTER — Ambulatory Visit (HOSPITAL_COMMUNITY)
Admission: RE | Admit: 2018-03-21 | Discharge: 2018-03-21 | Disposition: A | Discharge status: Home / Self Care | Payer: Medicare HMO | Source: Ambulatory Visit | Attending: Physician Assistant | Admitting: Physician Assistant

## 2018-03-21 ENCOUNTER — Other Ambulatory Visit (HOSPITAL_COMMUNITY): Payer: Self-pay | Admitting: Physician Assistant

## 2018-03-21 DIAGNOSIS — M5442 Lumbago with sciatica, left side: Secondary | ICD-10-CM | POA: Diagnosis present

## 2018-03-21 DIAGNOSIS — M5441 Lumbago with sciatica, right side: Secondary | ICD-10-CM | POA: Insufficient documentation

## 2018-03-21 DIAGNOSIS — Z1810 Retained metal fragments, unspecified: Secondary | ICD-10-CM | POA: Diagnosis not present

## 2018-03-21 DIAGNOSIS — Z135 Encounter for screening for eye and ear disorders: Secondary | ICD-10-CM | POA: Diagnosis present

## 2018-03-21 DIAGNOSIS — M795 Residual foreign body in soft tissue: Secondary | ICD-10-CM

## 2018-03-21 DIAGNOSIS — M5137 Other intervertebral disc degeneration, lumbosacral region: Secondary | ICD-10-CM | POA: Insufficient documentation

## 2018-03-21 DIAGNOSIS — G8929 Other chronic pain: Secondary | ICD-10-CM

## 2018-09-15 ENCOUNTER — Ambulatory Visit: Payer: Medicare HMO | Admitting: Urology

## 2018-10-27 ENCOUNTER — Ambulatory Visit (INDEPENDENT_AMBULATORY_CARE_PROVIDER_SITE_OTHER): Payer: Medicare HMO | Admitting: Urology

## 2018-10-27 DIAGNOSIS — C678 Malignant neoplasm of overlapping sites of bladder: Secondary | ICD-10-CM

## 2019-06-26 ENCOUNTER — Emergency Department (HOSPITAL_COMMUNITY): Payer: Medicare HMO

## 2019-06-26 ENCOUNTER — Other Ambulatory Visit: Payer: Self-pay

## 2019-06-26 ENCOUNTER — Inpatient Hospital Stay (HOSPITAL_COMMUNITY)
Admission: EM | Admit: 2019-06-26 | Discharge: 2019-07-10 | DRG: 175 | Disposition: A | Discharge status: Home Health | Payer: Medicare HMO | Attending: Internal Medicine | Admitting: Internal Medicine

## 2019-06-26 ENCOUNTER — Encounter (HOSPITAL_COMMUNITY): Payer: Self-pay | Admitting: Emergency Medicine

## 2019-06-26 DIAGNOSIS — Z915 Personal history of self-harm: Secondary | ICD-10-CM | POA: Diagnosis not present

## 2019-06-26 DIAGNOSIS — J189 Pneumonia, unspecified organism: Secondary | ICD-10-CM | POA: Diagnosis present

## 2019-06-26 DIAGNOSIS — Z8673 Personal history of transient ischemic attack (TIA), and cerebral infarction without residual deficits: Secondary | ICD-10-CM | POA: Diagnosis not present

## 2019-06-26 DIAGNOSIS — I2609 Other pulmonary embolism with acute cor pulmonale: Secondary | ICD-10-CM | POA: Diagnosis not present

## 2019-06-26 DIAGNOSIS — Z20828 Contact with and (suspected) exposure to other viral communicable diseases: Secondary | ICD-10-CM | POA: Diagnosis present

## 2019-06-26 DIAGNOSIS — Z8551 Personal history of malignant neoplasm of bladder: Secondary | ICD-10-CM | POA: Diagnosis not present

## 2019-06-26 DIAGNOSIS — Z8249 Family history of ischemic heart disease and other diseases of the circulatory system: Secondary | ICD-10-CM

## 2019-06-26 DIAGNOSIS — I2699 Other pulmonary embolism without acute cor pulmonale: Secondary | ICD-10-CM | POA: Diagnosis present

## 2019-06-26 DIAGNOSIS — Z833 Family history of diabetes mellitus: Secondary | ICD-10-CM | POA: Diagnosis not present

## 2019-06-26 DIAGNOSIS — J440 Chronic obstructive pulmonary disease with acute lower respiratory infection: Secondary | ICD-10-CM | POA: Diagnosis present

## 2019-06-26 DIAGNOSIS — Z7902 Long term (current) use of antithrombotics/antiplatelets: Secondary | ICD-10-CM

## 2019-06-26 DIAGNOSIS — R0902 Hypoxemia: Secondary | ICD-10-CM

## 2019-06-26 DIAGNOSIS — G894 Chronic pain syndrome: Secondary | ICD-10-CM | POA: Diagnosis present

## 2019-06-26 DIAGNOSIS — Z515 Encounter for palliative care: Secondary | ICD-10-CM

## 2019-06-26 DIAGNOSIS — Z7982 Long term (current) use of aspirin: Secondary | ICD-10-CM

## 2019-06-26 DIAGNOSIS — I248 Other forms of acute ischemic heart disease: Secondary | ICD-10-CM | POA: Diagnosis present

## 2019-06-26 DIAGNOSIS — R042 Hemoptysis: Secondary | ICD-10-CM | POA: Diagnosis present

## 2019-06-26 DIAGNOSIS — I2510 Atherosclerotic heart disease of native coronary artery without angina pectoris: Secondary | ICD-10-CM | POA: Diagnosis present

## 2019-06-26 DIAGNOSIS — Z7189 Other specified counseling: Secondary | ICD-10-CM

## 2019-06-26 DIAGNOSIS — C3490 Malignant neoplasm of unspecified part of unspecified bronchus or lung: Secondary | ICD-10-CM | POA: Diagnosis present

## 2019-06-26 DIAGNOSIS — F411 Generalized anxiety disorder: Secondary | ICD-10-CM | POA: Diagnosis present

## 2019-06-26 DIAGNOSIS — J9601 Acute respiratory failure with hypoxia: Secondary | ICD-10-CM | POA: Diagnosis present

## 2019-06-26 DIAGNOSIS — R0602 Shortness of breath: Secondary | ICD-10-CM | POA: Diagnosis not present

## 2019-06-26 DIAGNOSIS — R0600 Dyspnea, unspecified: Secondary | ICD-10-CM

## 2019-06-26 DIAGNOSIS — E1165 Type 2 diabetes mellitus with hyperglycemia: Secondary | ICD-10-CM | POA: Diagnosis present

## 2019-06-26 DIAGNOSIS — I2489 Other forms of acute ischemic heart disease: Secondary | ICD-10-CM | POA: Diagnosis present

## 2019-06-26 DIAGNOSIS — F17210 Nicotine dependence, cigarettes, uncomplicated: Secondary | ICD-10-CM | POA: Diagnosis present

## 2019-06-26 DIAGNOSIS — J441 Chronic obstructive pulmonary disease with (acute) exacerbation: Secondary | ICD-10-CM | POA: Diagnosis present

## 2019-06-26 DIAGNOSIS — Z720 Tobacco use: Secondary | ICD-10-CM | POA: Diagnosis present

## 2019-06-26 DIAGNOSIS — Z85038 Personal history of other malignant neoplasm of large intestine: Secondary | ICD-10-CM | POA: Diagnosis not present

## 2019-06-26 DIAGNOSIS — Z79899 Other long term (current) drug therapy: Secondary | ICD-10-CM | POA: Diagnosis not present

## 2019-06-26 LAB — CBC WITH DIFFERENTIAL/PLATELET
Abs Immature Granulocytes: 0.33 10*3/uL — ABNORMAL HIGH (ref 0.00–0.07)
Basophils Absolute: 0.1 10*3/uL (ref 0.0–0.1)
Basophils Relative: 0 %
Eosinophils Absolute: 0.1 10*3/uL (ref 0.0–0.5)
Eosinophils Relative: 0 %
HCT: 48.8 % (ref 39.0–52.0)
Hemoglobin: 15.8 g/dL (ref 13.0–17.0)
Immature Granulocytes: 1 %
Lymphocytes Relative: 9 %
Lymphs Abs: 2.6 10*3/uL (ref 0.7–4.0)
MCH: 27.5 pg (ref 26.0–34.0)
MCHC: 32.4 g/dL (ref 30.0–36.0)
MCV: 85 fL (ref 80.0–100.0)
Monocytes Absolute: 1.5 10*3/uL — ABNORMAL HIGH (ref 0.1–1.0)
Monocytes Relative: 5 %
Neutro Abs: 25.1 10*3/uL — ABNORMAL HIGH (ref 1.7–7.7)
Neutrophils Relative %: 85 %
Platelets: 344 10*3/uL (ref 150–400)
RBC: 5.74 MIL/uL (ref 4.22–5.81)
RDW: 13.4 % (ref 11.5–15.5)
WBC: 29.7 10*3/uL — ABNORMAL HIGH (ref 4.0–10.5)
nRBC: 0 % (ref 0.0–0.2)

## 2019-06-26 LAB — GLUCOSE, CAPILLARY
Glucose-Capillary: 347 mg/dL — ABNORMAL HIGH (ref 70–99)
Glucose-Capillary: 292 mg/dL — ABNORMAL HIGH (ref 70–99)

## 2019-06-26 LAB — HEPARIN LEVEL (UNFRACTIONATED)
Heparin Unfractionated: 0.82 IU/mL — ABNORMAL HIGH (ref 0.30–0.70)
Heparin Unfractionated: 0.18 IU/mL — ABNORMAL LOW (ref 0.30–0.70)

## 2019-06-26 LAB — HEMOGLOBIN A1C
Hgb A1c MFr Bld: 8.1 % — ABNORMAL HIGH (ref 4.8–5.6)
Mean Plasma Glucose: 185.77 mg/dL

## 2019-06-26 LAB — RAPID URINE DRUG SCREEN, HOSP PERFORMED
Amphetamines: NOT DETECTED
Barbiturates: NOT DETECTED
Benzodiazepines: NOT DETECTED
Cocaine: NOT DETECTED
Opiates: NOT DETECTED
Tetrahydrocannabinol: NOT DETECTED

## 2019-06-26 LAB — BLOOD GAS, ARTERIAL
Acid-base deficit: 3.6 mmol/L — ABNORMAL HIGH (ref 0.0–2.0)
Bicarbonate: 22 mmol/L (ref 20.0–28.0)
FIO2: 100
O2 Saturation: 98.1 %
Patient temperature: 37
pCO2 arterial: 33.2 mmHg (ref 32.0–48.0)
pH, Arterial: 7.405 (ref 7.350–7.450)
pO2, Arterial: 141 mmHg — ABNORMAL HIGH (ref 83.0–108.0)

## 2019-06-26 LAB — COMPREHENSIVE METABOLIC PANEL
ALT: 20 U/L (ref 0–44)
AST: 20 U/L (ref 15–41)
Albumin: 3 g/dL — ABNORMAL LOW (ref 3.5–5.0)
Alkaline Phosphatase: 96 U/L (ref 38–126)
Anion gap: 15 (ref 5–15)
BUN: 31 mg/dL — ABNORMAL HIGH (ref 6–20)
CO2: 23 mmol/L (ref 22–32)
Calcium: 9.5 mg/dL (ref 8.9–10.3)
Chloride: 98 mmol/L (ref 98–111)
Creatinine, Ser: 1.09 mg/dL (ref 0.61–1.24)
GFR calc Af Amer: >60 mL/min (ref >60)
GFR calc non Af Amer: >60 mL/min (ref >60)
Glucose, Bld: 338 mg/dL — ABNORMAL HIGH (ref 70–99)
Potassium: 4.4 mmol/L (ref 3.5–5.1)
Sodium: 136 mmol/L (ref 135–145)
Total Bilirubin: 0.4 mg/dL (ref 0.3–1.2)
Total Protein: 8.2 g/dL — ABNORMAL HIGH (ref 6.5–8.1)

## 2019-06-26 LAB — PROTIME-INR
INR: 1.6 — ABNORMAL HIGH (ref 0.8–1.2)
Prothrombin Time: 18.5 seconds — ABNORMAL HIGH (ref 11.4–15.2)

## 2019-06-26 LAB — HIV ANTIBODY (ROUTINE TESTING W REFLEX): HIV Screen 4th Generation wRfx: NONREACTIVE

## 2019-06-26 LAB — TROPONIN I (HIGH SENSITIVITY)
Troponin I (High Sensitivity): 341 ng/L (ref <18)
Troponin I (High Sensitivity): 352 ng/L (ref <18)

## 2019-06-26 LAB — APTT: aPTT: 97 seconds — ABNORMAL HIGH (ref 24–36)

## 2019-06-26 LAB — SARS CORONAVIRUS 2 BY RT PCR (HOSPITAL ORDER, PERFORMED IN ~~LOC~~ HOSPITAL LAB): SARS Coronavirus 2: NEGATIVE

## 2019-06-26 LAB — BRAIN NATRIURETIC PEPTIDE: B Natriuretic Peptide: 1184 pg/mL — ABNORMAL HIGH (ref 0.0–100.0)

## 2019-06-26 LAB — MRSA PCR SCREENING: MRSA by PCR: NEGATIVE

## 2019-06-26 LAB — PROCALCITONIN: Procalcitonin: 0.22 ng/mL

## 2019-06-26 MED ORDER — ALBUTEROL SULFATE HFA 108 (90 BASE) MCG/ACT IN AERS
8.0000 | INHALATION_SPRAY | Freq: Once | RESPIRATORY_TRACT | Status: DC
  Filled 2019-06-26: qty 6.7

## 2019-06-26 MED ORDER — SODIUM CHLORIDE 0.9 % IV SOLN
2.0000 g | INTRAVENOUS | Status: DC
  Administered 2019-06-26 – 2019-07-01 (×6): 2 g via INTRAVENOUS
  Filled 2019-06-26 (×6): qty 20

## 2019-06-26 MED ORDER — IOHEXOL 350 MG/ML SOLN
100.0000 mL | Freq: Once | INTRAVENOUS | Status: AC | PRN
  Administered 2019-06-26: 100 mL via INTRAVENOUS

## 2019-06-26 MED ORDER — HEPARIN BOLUS VIA INFUSION
3000.0000 [IU] | Freq: Once | INTRAVENOUS | Status: AC
  Administered 2019-06-26: 3000 [IU] via INTRAVENOUS
  Filled 2019-06-26: qty 3000

## 2019-06-26 MED ORDER — ALBUTEROL SULFATE HFA 108 (90 BASE) MCG/ACT IN AERS
INHALATION_SPRAY | RESPIRATORY_TRACT | Status: AC
  Administered 2019-06-26: 10:00:00
  Filled 2019-06-26: qty 6.7

## 2019-06-26 MED ORDER — METHYLPREDNISOLONE SODIUM SUCC 125 MG IJ SOLR
60.0000 mg | Freq: Two times a day (BID) | INTRAMUSCULAR | Status: DC
  Administered 2019-06-26: 60 mg via INTRAVENOUS
  Filled 2019-06-26: qty 2

## 2019-06-26 MED ORDER — INSULIN ASPART 100 UNIT/ML ~~LOC~~ SOLN
0.0000 [IU] | Freq: Three times a day (TID) | SUBCUTANEOUS | Status: DC
  Administered 2019-06-27 (×2): 11 [IU] via SUBCUTANEOUS
  Administered 2019-06-27 – 2019-06-28 (×2): 5 [IU] via SUBCUTANEOUS

## 2019-06-26 MED ORDER — OXYCODONE-ACETAMINOPHEN 5-325 MG PO TABS
1.0000 | ORAL_TABLET | Freq: Four times a day (QID) | ORAL | Status: DC | PRN
  Administered 2019-06-26 – 2019-06-27 (×3): 1 via ORAL
  Filled 2019-06-26 (×3): qty 1

## 2019-06-26 MED ORDER — ONDANSETRON HCL 4 MG/2ML IJ SOLN: 4.0000 mg | Freq: Four times a day (QID) | INTRAMUSCULAR | Status: DC | PRN

## 2019-06-26 MED ORDER — HEPARIN BOLUS VIA INFUSION
4000.0000 [IU] | Freq: Once | INTRAVENOUS | Status: AC
  Administered 2019-06-26: 4000 [IU] via INTRAVENOUS

## 2019-06-26 MED ORDER — ONDANSETRON HCL 4 MG PO TABS: 4.0000 mg | ORAL_TABLET | Freq: Four times a day (QID) | ORAL | Status: DC | PRN

## 2019-06-26 MED ORDER — IPRATROPIUM-ALBUTEROL 0.5-2.5 (3) MG/3ML IN SOLN
3.0000 mL | Freq: Four times a day (QID) | RESPIRATORY_TRACT | Status: DC
  Administered 2019-06-26 – 2019-06-27 (×2): 3 mL via RESPIRATORY_TRACT
  Filled 2019-06-26 (×3): qty 3

## 2019-06-26 MED ORDER — PRAVASTATIN SODIUM 40 MG PO TABS
40.0000 mg | ORAL_TABLET | Freq: Every day | ORAL | Status: DC
  Administered 2019-06-27: 40 mg via ORAL
  Filled 2019-06-26: qty 1

## 2019-06-26 MED ORDER — METHYLPREDNISOLONE SODIUM SUCC 125 MG IJ SOLR
125.0000 mg | Freq: Once | INTRAMUSCULAR | Status: AC
  Administered 2019-06-26: 125 mg via INTRAVENOUS
  Filled 2019-06-26: qty 2

## 2019-06-26 MED ORDER — ACETAMINOPHEN 325 MG PO TABS
650.0000 mg | ORAL_TABLET | Freq: Four times a day (QID) | ORAL | Status: DC | PRN
  Administered 2019-06-27: 650 mg via ORAL
  Filled 2019-06-26: qty 2

## 2019-06-26 MED ORDER — INSULIN ASPART 100 UNIT/ML ~~LOC~~ SOLN
0.0000 [IU] | Freq: Every day | SUBCUTANEOUS | Status: DC
  Administered 2019-06-26: 3 [IU] via SUBCUTANEOUS
  Administered 2019-06-27: 4 [IU] via SUBCUTANEOUS

## 2019-06-26 MED ORDER — CHLORHEXIDINE GLUCONATE CLOTH 2 % EX PADS
6.0000 | MEDICATED_PAD | Freq: Every day | CUTANEOUS | Status: DC
  Administered 2019-06-26 – 2019-07-10 (×13): 6 via TOPICAL

## 2019-06-26 MED ORDER — BUDESONIDE 0.5 MG/2ML IN SUSP
0.5000 mg | Freq: Two times a day (BID) | RESPIRATORY_TRACT | Status: DC
  Administered 2019-06-26 – 2019-07-10 (×28): 0.5 mg via RESPIRATORY_TRACT
  Filled 2019-06-26 (×27): qty 2

## 2019-06-26 MED ORDER — HEPARIN (PORCINE) 25000 UT/250ML-% IV SOLN
2500.0000 [IU]/h | INTRAVENOUS | Status: DC
  Administered 2019-06-26: 1600 [IU]/h via INTRAVENOUS
  Administered 2019-06-27: 1900 [IU]/h via INTRAVENOUS
  Administered 2019-06-27: 2100 [IU]/h via INTRAVENOUS
  Administered 2019-06-28: 2300 [IU]/h via INTRAVENOUS
  Administered 2019-06-29 – 2019-07-01 (×5): 2500 [IU]/h via INTRAVENOUS
  Filled 2019-06-26 (×12): qty 250

## 2019-06-26 MED ORDER — SODIUM CHLORIDE 0.9 % IV SOLN
500.0000 mg | INTRAVENOUS | Status: DC
  Administered 2019-06-26 – 2019-07-01 (×6): 500 mg via INTRAVENOUS
  Filled 2019-06-26 (×6): qty 500

## 2019-06-26 MED ORDER — MAGNESIUM SULFATE 2 GM/50ML IV SOLN
2.0000 g | Freq: Once | INTRAVENOUS | Status: AC
  Administered 2019-06-26: 2 g via INTRAVENOUS
  Filled 2019-06-26: qty 50

## 2019-06-26 MED ORDER — ACETAMINOPHEN 650 MG RE SUPP: 650.0000 mg | Freq: Four times a day (QID) | RECTAL | Status: DC | PRN
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
## 2019-06-26 NOTE — ED Notes (Signed)
Date and time results received: 06/26/19 1210  Test: Troponin Critical Value: 352  Name of Provider Notified: Dr. Laverta Baltimore  Orders Received? Or Actions Taken?: See chart

## 2019-06-26 NOTE — ED Notes (Signed)
Pt placed on 100% o2 on nrb.   Current o2 sat 100%
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
## 2019-06-26 NOTE — ED Triage Notes (Signed)
Per EMS, pt from home c/o sudden onset of SOB that began 2 hours ago. Pt recently diagnosed with bronchitis. Pt hx of asthma. Pt's sats 80% on 6L, on 10L sats increased to 92%. Denies fever, new cough, or COVID contacts.
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
## 2019-06-27 ENCOUNTER — Inpatient Hospital Stay (HOSPITAL_COMMUNITY): Payer: Medicare HMO

## 2019-06-27 DIAGNOSIS — R0602 Shortness of breath: Secondary | ICD-10-CM

## 2019-06-27 LAB — CBC
HCT: 54.1 % — ABNORMAL HIGH (ref 39.0–52.0)
Hemoglobin: 16.7 g/dL (ref 13.0–17.0)
MCH: 27.2 pg (ref 26.0–34.0)
MCHC: 30.9 g/dL (ref 30.0–36.0)
MCV: 88.1 fL (ref 80.0–100.0)
Platelets: 330 10*3/uL (ref 150–400)
RBC: 6.14 MIL/uL — ABNORMAL HIGH (ref 4.22–5.81)
RDW: 13.7 % (ref 11.5–15.5)
WBC: 29.2 10*3/uL — ABNORMAL HIGH (ref 4.0–10.5)
nRBC: 0 % (ref 0.0–0.2)

## 2019-06-27 LAB — PROCALCITONIN: Procalcitonin: 0.25 ng/mL

## 2019-06-27 LAB — ECHOCARDIOGRAM COMPLETE
Height: 73 in
Weight: 3576.74 oz

## 2019-06-27 LAB — BASIC METABOLIC PANEL
Anion gap: 17 — ABNORMAL HIGH (ref 5–15)
BUN: 41 mg/dL — ABNORMAL HIGH (ref 6–20)
CO2: 20 mmol/L — ABNORMAL LOW (ref 22–32)
Calcium: 9.3 mg/dL (ref 8.9–10.3)
Chloride: 102 mmol/L (ref 98–111)
Creatinine, Ser: 1.1 mg/dL (ref 0.61–1.24)
GFR calc Af Amer: >60 mL/min (ref >60)
GFR calc non Af Amer: >60 mL/min (ref >60)
Glucose, Bld: 259 mg/dL — ABNORMAL HIGH (ref 70–99)
Potassium: 4.4 mmol/L (ref 3.5–5.1)
Sodium: 139 mmol/L (ref 135–145)

## 2019-06-27 LAB — GLUCOSE, CAPILLARY
Glucose-Capillary: 248 mg/dL — ABNORMAL HIGH (ref 70–99)
Glucose-Capillary: 346 mg/dL — ABNORMAL HIGH (ref 70–99)
Glucose-Capillary: 321 mg/dL — ABNORMAL HIGH (ref 70–99)
Glucose-Capillary: 317 mg/dL — ABNORMAL HIGH (ref 70–99)

## 2019-06-27 LAB — LIPID PANEL
Cholesterol: 121 mg/dL (ref 0–200)
HDL: 40 mg/dL — ABNORMAL LOW (ref >40)
LDL Cholesterol: 53 mg/dL (ref 0–99)
Total CHOL/HDL Ratio: 3 RATIO
Triglycerides: 138 mg/dL (ref <150)
VLDL: 28 mg/dL (ref 0–40)

## 2019-06-27 LAB — HEPARIN LEVEL (UNFRACTIONATED)
Heparin Unfractionated: 0.35 IU/mL (ref 0.30–0.70)
Heparin Unfractionated: 0.12 IU/mL — ABNORMAL LOW (ref 0.30–0.70)
Heparin Unfractionated: 0.27 IU/mL — ABNORMAL LOW (ref 0.30–0.70)

## 2019-06-27 MED ORDER — POLYETHYLENE GLYCOL 3350 17 G PO PACK
17.0000 g | PACK | Freq: Every day | ORAL | Status: DC | PRN
  Administered 2019-06-27: 17 g via ORAL
  Filled 2019-06-27: qty 1

## 2019-06-27 MED ORDER — INSULIN DETEMIR 100 UNIT/ML ~~LOC~~ SOLN
10.0000 [IU] | Freq: Every day | SUBCUTANEOUS | Status: DC
  Administered 2019-06-27 – 2019-06-28 (×2): 10 [IU] via SUBCUTANEOUS
  Filled 2019-06-27 (×3): qty 0.1

## 2019-06-27 MED ORDER — OXYCODONE-ACETAMINOPHEN 10-325 MG PO TABS: 1.0000 | ORAL_TABLET | Freq: Four times a day (QID) | ORAL | Status: DC | PRN

## 2019-06-27 MED ORDER — OXYCODONE HCL 5 MG PO TABS
5.0000 mg | ORAL_TABLET | Freq: Four times a day (QID) | ORAL | Status: DC | PRN
  Administered 2019-06-27 – 2019-07-08 (×25): 5 mg via ORAL
  Filled 2019-06-27 (×25): qty 1

## 2019-06-27 MED ORDER — ASPIRIN 81 MG PO CHEW: 81.0000 mg | CHEWABLE_TABLET | Freq: Every day | ORAL | Status: DC

## 2019-06-27 MED ORDER — VARENICLINE TARTRATE 1 MG PO TABS
0.5000 mg | ORAL_TABLET | Freq: Two times a day (BID) | ORAL | Status: DC
  Administered 2019-06-27 – 2019-07-10 (×27): 0.5 mg via ORAL
  Filled 2019-06-27 (×29): qty 1

## 2019-06-27 MED ORDER — GLUCERNA SHAKE PO LIQD
237.0000 mL | Freq: Three times a day (TID) | ORAL | Status: DC
  Administered 2019-06-27 – 2019-07-10 (×34): 237 mL via ORAL

## 2019-06-27 MED ORDER — LORAZEPAM 1 MG PO TABS
2.0000 mg | ORAL_TABLET | Freq: Once | ORAL | Status: AC
  Administered 2019-06-27: 2 mg via ORAL
  Filled 2019-06-27: qty 2

## 2019-06-27 MED ORDER — HEPARIN BOLUS VIA INFUSION
3000.0000 [IU] | Freq: Once | INTRAVENOUS | Status: AC
  Administered 2019-06-27: 3000 [IU] via INTRAVENOUS
  Filled 2019-06-27: qty 3000

## 2019-06-27 MED ORDER — OXYCODONE-ACETAMINOPHEN 5-325 MG PO TABS
1.0000 | ORAL_TABLET | Freq: Four times a day (QID) | ORAL | Status: DC | PRN
  Administered 2019-06-27 – 2019-07-10 (×28): 1 via ORAL
  Filled 2019-06-27 (×28): qty 1

## 2019-06-27 MED ORDER — PREDNISONE 20 MG PO TABS
40.0000 mg | ORAL_TABLET | Freq: Every day | ORAL | Status: DC
  Administered 2019-06-27 – 2019-06-30 (×4): 40 mg via ORAL
  Filled 2019-06-27 (×4): qty 2

## 2019-06-27 MED ORDER — HEPARIN BOLUS VIA INFUSION
1500.0000 [IU] | Freq: Once | INTRAVENOUS | Status: AC
  Administered 2019-06-27: 1500 [IU] via INTRAVENOUS
  Filled 2019-06-27: qty 1500

## 2019-06-27 MED ORDER — IPRATROPIUM-ALBUTEROL 0.5-2.5 (3) MG/3ML IN SOLN
3.0000 mL | Freq: Four times a day (QID) | RESPIRATORY_TRACT | Status: DC | PRN
  Administered 2019-06-27 – 2019-06-29 (×4): 3 mL via RESPIRATORY_TRACT
  Filled 2019-06-27 (×3): qty 3

## 2019-06-27 MED ORDER — CLOPIDOGREL BISULFATE 75 MG PO TABS: 75.0000 mg | ORAL_TABLET | Freq: Every day | ORAL | Status: DC

## 2019-06-27 NOTE — Progress Notes (Signed)
Initial Nutrition Assessment  DOCUMENTATION CODES:   Not applicable  INTERVENTION:  -Glucerna Shake po TID, each supplement provides 220 kcal and 10 grams of protein -MVI daily   NUTRITION DIAGNOSIS:   Increased nutrient needs related to acute illness(pulmonary embolus with RV strain) as evidenced by estimated needs.  GOAL:   Patient will meet greater than or equal to 90% of their needs  MONITOR:   PO intake, Labs, I & O's, Supplement acceptance, Weight trends  REASON FOR ASSESSMENT:   Malnutrition Screening Tool    ASSESSMENT:  58 year old male with medical history significant of COPD, tobacco abuse, nonobstructive CAD, THC use, history of bladder and colon cancer, nephrolithotomy who presented to ED with complaints of acute onset SOB and chest pain with small episode of hemoptysis. CT angiogram chest showed pulmonary embolus in the bilateral distal main pulmonary artery with extension into bilateral upper/lower lobes.  Admitted with acute pulmonary embolus; hypoxic requiring NRB  Per chart review, patient doing well this morning with stable vitals and currently on room air saturating near 100%. Plans to wean IV steroids; will monitor CBGS; 2D Echo pending.   Patient receiving HH/CM therapeutic diet; no documented meals at this time. Will continue to monitor for po intake and provide nutrition supplement to aid with calorie and protein needs.   I/Os: -945 ml since admit UOP: 1575 ml since admit  Current wt 101.4 kg (223.1 lb) No recent wt history available for review 06/28/17: 115.7 kg (254.5 lb); noted 31.4 lb (12.3%)wt loss over the past 2 years which is insignificant for time frame.   Medications reviewed and include: SSI, levemir, prednisone, zithromax, rocephin, heparin  Labs: CBGS [id number] Lab Results  Component Value Date   HGBA1C 8.1 (H) 06/26/2019     Diet Order:   Diet Order            Diet heart healthy/carb modified Room service appropriate? Yes;  Fluid consistency: Thin  Diet effective now              EDUCATION NEEDS:   No education needs have been identified at this time  Skin:  Skin Assessment: Reviewed RN Assessment  Last BM:  10/12  Height:   Ht Readings from Last 1 Encounters:  06/26/19 6\' 1"  (1.854 m)    Weight:   Wt Readings from Last 1 Encounters:  06/27/19 101.4 kg    Ideal Body Weight:  83.6 kg  BMI:  Body mass index is 29.49 kg/m.  Estimated Nutritional Needs:   Kcal:  2075-2250 (MSJ 1.1-1.2)  Protein:  109-117 (1.3-1.4 g/kg/IBW)  Fluid:  >/= 2 L/day   Lajuan Lines, RD, LDN Clinical Nutrition Office [phone] After Hours/Weekend Pager: [phone]

## 2019-06-27 NOTE — Progress Notes (Signed)
Pt insist on wearing a NRB. Dr. Manuella Ghazi wants pt on a nasal cannula. Pt encouraged to try a nasal cannula

## 2019-06-27 NOTE — Progress Notes (Addendum)
PROGRESS NOTE    Craig Owens  HBZ:[id number] DOB: Sep 15, 1960 DOA: 06/26/2019 PCP: Lucia Gaskins, MD   Brief Narrative:  Per HPI: Craig Owens is a 58 y.o. male with medical history of COPD, tobacco abuse, nonobstructive CAD, THC use, remote stroke presenting with acute onset of shortness of breath and chest discomfort approximately 2 hours prior to admission.  The patient denies any fevers, chills, headache, neck pain, nausea, vomiting, diarrhea, abdominal pain.  He has had a small amount of hemoptysis for 1 day.  He denies any hematemesis, hematochezia, melena. Notably, the patient states that he was told that he had "lung cancer" approximately 5 years ago by his primary care provider, but the patient was lost to follow-up and never followed through with work-up. Nevertheless, the patient continued to smoke approximately 2 packs/day for the last 40 years.  He uses THC but denies any other illegal drug use.  Upon EMS arrival, the patient was noted to be hypoxic with oxygen saturation of 80% on 6 L.  The patient was subsequently placed on nonrebreather in the emergency department with oxygen saturations 96-98%. In the emergency department, the patient was afebrile, tachycardic with HR 110-120, and hypoxic requiring a nonrebreather with oxygen saturation 98%.  CT angiogram chest showed pulmonary embolus in the bilateral distal main pulmonary artery with extension into the upper lobes and lower lobes bilateral.  There was RV strain.  10/14: Patient appears to be doing well this morning with stable vital signs noted.  He is actually on room air saturating near 100%.  He denies any other symptoms and 2D echocardiogram is pending.  We will plan to increase insulin coverage and wean steroids.  Palliative consulted for goals of care discussion.  Assessment & Plan:   Principal Problem:   Acute pulmonary embolus (HCC) Active Problems:   Acute respiratory failure with hypoxia (HCC)   COPD with  acute exacerbation (HCC)   Uncontrolled type 2 diabetes mellitus with hyperglycemia (HCC)   Acute hypoxemic respiratory failure secondary to submassive PE -Case discussed with pulmonology who did not feel TPA currently indicated -Continue on IV heparin for total 5 days with end date 10/18 -Continue to monitor closely and likely transfer to telemetry soon if stable -2D echocardiogram pending -COVID testing negative  Acute COPD exacerbation with possible pneumonia-improving -Continue on Rocephin and azithromycin with procalcitonin elevation noted, plan to complete 5-day course -Wean IV steroids to oral prednisone -Breathing treatments to as needed -Continue Pulmicort  Pulmonary opacities/nodules -Suspicious for cavitary lung cancer and and bony metastasis -We will have follow-up with oncology in the near future, but first goals of care discussion with palliative regarding these findings -Patient was apparently told he had lung cancer 5 years ago but never pursued work-up  Elevated troponin secondary to PE with RV strain -We will follow-up 2D echocardiogram -Does not appear to be clinically significant for ACS  Type 2 diabetes with uncontrolled hyperglycemia -Hemoglobin A1c 8.1% -We will wean steroids today and add Levemir 10 units daily -Continue SSI and carb modified diet and follow carefully  Remote CVA 5 years ago -Hold aspirin and Plavix while on heparin drip -Lipid panel with LDL of 53  THC and tobacco abuse -Urine drug screen with no acute findings -Discussed tobacco cessation and will consider nicotine patch as needed  Chronic pain syndrome -Patient currently on Percocet 5/325 as needed for pain control -Queried on PMP aware   DVT prophylaxis: Heparin drip Code Status: Full Family Communication: None at bedside Disposition  Plan: Plan to continue treatment on IV heparin drip through 10/18.  Continue antibiotics, steroid, and breathing treatments and monitor closely.   SSI.   Consultants:   Palliative  Procedures:   None  Antimicrobials:  Anti-infectives (From admission, onward)   Start     Dose/Rate Route Frequency Ordered Stop   06/26/19 1115  cefTRIAXone (ROCEPHIN) 2 g in sodium chloride 0.9 % 100 mL IVPB     2 g 200 mL/hr over 30 Minutes Intravenous Every 24 hours 06/26/19 1105     06/26/19 1115  azithromycin (ZITHROMAX) 500 mg in sodium chloride 0.9 % 250 mL IVPB     500 mg 250 mL/hr over 60 Minutes Intravenous Every 24 hours 06/26/19 1105         Subjective: Patient seen and evaluated today with no new acute complaints or concerns. No acute concerns or events noted overnight.  He is asking for breakfast and is hungry.  Objective: Vitals:   06/27/19 0720 06/27/19 0753 06/27/19 0756 06/27/19 0800  BP:    123/86  Pulse: 94   96  Resp: (!) 24   (!) 28  Temp: 97.6 F (36.4 C)     TempSrc: Oral     SpO2: 100% 100% 94% 95%  Weight:      Height:        Intake/Output Summary (Last 24 hours) at 06/27/2019 1004 Last data filed at 06/27/2019 0556 Gross per 24 hour  Intake 629.87 ml  Output 1575 ml  Net -945.13 ml   Filed Weights   06/26/19 0959 06/26/19 1815 06/27/19 0500  Weight: 105.7 kg 101.6 kg 101.4 kg    Examination:  General exam: Appears calm and comfortable  Respiratory system: Clear to auscultation. Respiratory effort normal.  Currently on room air. Cardiovascular system: S1 & S2 heard, RRR. No JVD, murmurs, rubs, gallops or clicks. No pedal edema. Gastrointestinal system: Abdomen is nondistended, soft and nontender. No organomegaly or masses felt. Normal bowel sounds heard. Central nervous system: Alert and oriented. No focal neurological deficits. Extremities: Symmetric 5 x 5 power. Skin: No rashes, lesions or ulcers Psychiatry: Judgement and insight appear normal. Mood & affect appropriate.     Data Reviewed: I have personally reviewed following labs and imaging studies  CBC: Recent Labs  Lab  06/26/19 1000 06/27/19 0422  WBC 29.7* 29.2*  NEUTROABS 25.1*  --   HGB 15.8 16.7  HCT 48.8 54.1*  MCV 85.0 88.1  PLT 344 989   Basic Metabolic Panel: Recent Labs  Lab 06/26/19 1000 06/27/19 0422  NA 136 139  K 4.4 4.4  CL 98 102  CO2 23 20*  GLUCOSE 338* 259*  BUN 31* 41*  CREATININE 1.09 1.10  CALCIUM 9.5 9.3   GFR: Estimated Creatinine Clearance: 89.4 mL/min (by C-G formula based on SCr of 1.1 mg/dL). Liver Function Tests: Recent Labs  Lab 06/26/19 1000  AST 20  ALT 20  ALKPHOS 96  BILITOT 0.4  PROT 8.2*  ALBUMIN 3.0*   No results for input(s): LIPASE, AMYLASE in the last 168 hours. No results for input(s): AMMONIA in the last 168 hours. Coagulation Profile: Recent Labs  Lab 06/26/19 1336  INR 1.6*   Cardiac Enzymes: No results for input(s): CKTOTAL, CKMB, CKMBINDEX, TROPONINI in the last 168 hours. BNP (last 3 results) No results for input(s): PROBNP in the last 8760 hours. HbA1C: Recent Labs    06/26/19 1000  HGBA1C 8.1*   CBG: Recent Labs  Lab 06/26/19 1801 06/26/19 2127 06/27/19  [place]* 248*   Lipid Profile: Recent Labs    06/27/19 0422  CHOL 121  HDL 40*  LDLCALC 53  TRIG 138  CHOLHDL 3.0   Thyroid Function Tests: No results for input(s): TSH, T4TOTAL, FREET4, T3FREE, THYROIDAB in the last 72 hours. Anemia Panel: No results for input(s): VITAMINB12, FOLATE, FERRITIN, TIBC, IRON, RETICCTPCT in the last 72 hours. Sepsis Labs: Recent Labs  Lab 06/26/19 1336 06/27/19 0422  PROCALCITON 0.22 0.25    Recent Results (from the past 240 hour(s))  SARS Coronavirus 2 by RT PCR (hospital order, performed in [facility] hospital lab) Nasopharyngeal Nasopharyngeal Swab     Status: None   Collection Time: 06/26/19 11:06 AM   Specimen: Nasopharyngeal Swab  Result Value Ref Range Status   SARS Coronavirus 2 NEGATIVE NEGATIVE Final    Comment: (NOTE) If result is NEGATIVE SARS-CoV-2 target nucleic acids are NOT  DETECTED. The SARS-CoV-2 RNA is generally detectable in upper and lower  respiratory specimens during the acute phase of infection. The lowest  concentration of SARS-CoV-2 viral copies this assay can detect is 250  copies / mL. A negative result does not preclude SARS-CoV-2 infection  and should not be used as the sole basis for treatment or other  patient management decisions.  A negative result may occur with  improper specimen collection / handling, submission of specimen other  than nasopharyngeal swab, presence of viral mutation(s) within the  areas targeted by this assay, and inadequate number of viral copies  (<250 copies / mL). A negative result must be combined with clinical  observations, patient history, and epidemiological information. If result is POSITIVE SARS-CoV-2 target nucleic acids are DETECTED. The SARS-CoV-2 RNA is generally detectable in upper and lower  respiratory specimens dur ing the acute phase of infection.  Positive  results are indicative of active infection with SARS-CoV-2.  Clinical  correlation with patient history and other diagnostic information is  necessary to determine patient infection status.  Positive results do  not rule out bacterial infection or co-infection with other viruses. If result is PRESUMPTIVE POSTIVE SARS-CoV-2 nucleic acids MAY BE PRESENT.   A presumptive positive result was obtained on the submitted specimen  and confirmed on repeat testing.  While 2019 novel coronavirus  (SARS-CoV-2) nucleic acids may be present in the submitted sample  additional confirmatory testing may be necessary for epidemiological  and / or clinical management purposes  to differentiate between  SARS-CoV-2 and other Sarbecovirus currently known to infect humans.  If clinically indicated additional testing with an alternate test  methodology [phone]) is advised. The SARS-CoV-2 RNA is generally  detectable in upper and lower respiratory sp ecimens during  the acute  phase of infection. The expected result is Negative. Fact Sheet for Patients:  StrictlyIdeas.no Fact Sheet for Healthcare Providers: BankingDealers.co.za This test is not yet approved or cleared by the [place] FDA and has been authorized for detection and/or diagnosis of SARS-CoV-2 by FDA under an Emergency Use Authorization (EUA).  This EUA will remain in effect (meaning this test can be used) for the duration of the COVID-19 declaration under Section 564(b)(1) of the Act, 21 U.S.C. section 360bbb-3(b)(1), unless the authorization is terminated or revoked sooner. Performed at [facility], [address] [place], [place] [address]   Blood Culture (routine x 2)     Status: None (Preliminary result)   Collection Time: 06/26/19 11:21 AM   Specimen: BLOOD RIGHT ARM  Result Value Ref Range Status  Specimen Description BLOOD RIGHT ARM  Final   Special Requests   Final    BOTTLES DRAWN AEROBIC AND ANAEROBIC Blood Culture adequate volume   Culture   Final    NO GROWTH < 24 HOURS Performed at [facility], [address] [place], [place] [address]    Report Status PENDING  Incomplete  Blood Culture (routine x 2)     Status: None (Preliminary result)   Collection Time: 06/26/19 11:32 AM   Specimen: BLOOD RIGHT FOREARM  Result Value Ref Range Status   Specimen Description BLOOD RIGHT FOREARM  Final   Special Requests   Final    BOTTLES DRAWN AEROBIC AND ANAEROBIC Blood Culture adequate volume   Culture   Final    NO GROWTH < 24 HOURS Performed at [facility], [address], [place], [place] [address]    Report Status PENDING  Incomplete  MRSA PCR Screening     Status: None   Collection Time: 06/26/19  6:15 PM   Specimen: Nasal Mucosa; Nasopharyngeal  Result Value Ref Range Status   MRSA by PCR NEGATIVE NEGATIVE Final    Comment:        The GeneXpert MRSA Assay (FDA approved for NASAL specimens only), is one  component of a comprehensive MRSA colonization surveillance program. It is not intended to diagnose MRSA infection nor to guide or monitor treatment for MRSA infections. Performed at [facility], [address] [place], [place] [address]          Radiology Studies: Ct Angio Chest Pe W And/or Wo Contrast  Result Date: 06/26/2019 CLINICAL DATA:  Shortness of breath EXAM: CT ANGIOGRAPHY CHEST WITH CONTRAST TECHNIQUE: Multidetector CT imaging of the chest was performed using the standard protocol during bolus administration of intravenous contrast. Multiplanar CT image reconstructions and MIPs were obtained to evaluate the vascular anatomy. CONTRAST:  151mL OMNIPAQUE IOHEXOL 350 MG/ML SOLN COMPARISON:  Chest radiograph June 26, 2019 FINDINGS: Cardiovascular: There are pulmonary emboli arising from each distal main pulmonary artery, more pronounced on the left than on the right, with extension of pulmonary emboli throughout multiple upper and lower lobe pulmonary arterial branches bilaterally, more pronounced overall on the left than on the right. The right ventricle to left ventricle diameter ratio is 2.1, indicative of right heart strain. There is no thoracic aortic aneurysm or dissection. Visualized great vessels appear unremarkable. There is no pericardial effusion or pericardial thickening. There are foci of aortic atherosclerosis. The main pulmonary outflow tract measures 3.5 cm in diameter, prominent. Mediastinum/Nodes: Thyroid appears unremarkable. There are scattered subcentimeter mediastinal lymph nodes. There is a right pretracheal lymph node measuring 1.5 x 1.2 cm. No esophageal lesions are evident. Lungs/Pleura: There is an area of irregular opacity in the posterior segment of the right upper lobe toward the apex measuring 3.1 x 2.9 cm. There is patchy airspace opacity in the periphery of each upper lobe. There is a nodular opacity in the posterior segment of the right upper lobe seen  on axial slice 49 series 6 measuring 1.0 x 1.0 cm. A nodular opacity abuts the pleura in the posterior segment of the right upper lobe measuring 0.7 cm. There is ill-defined airspace opacity in the lingula and anterior segment of the right lower lobe. There is irregular opacity with early cavitation in the anterior and lateral segments of the left lower lobe measuring 4.8 x 2.9 cm. There is a nodular opacity slightly inferior to this area of apparent cavitation measuring 1.6 x 1.7 cm.  There are areas of patchy atelectasis in the lung bases. A nodular opacity in the lateral segment left lower lobe measures 0.9 x 0.9 cm. There are suspected pulmonary infarcts in the right lower lobe peripherally. These areas are wedge-shaped. There are no evident pleural effusions. Upper Abdomen: There is atherosclerotic plaque in the upper abdominal aorta. There is reflux of contrast into the inferior vena cava and hepatic veins. Visualized upper abdominal structures otherwise appear unremarkable. Musculoskeletal: Sclerotic foci are noted in several upper thoracic vertebral bodies. No lytic or destructive bone lesions are evident. No chest wall lesions evident. Review of the MIP images confirms the above findings. IMPRESSION: 1. Extensive bilateral pulmonary embolus, more severe on the left than on the right which arises from the distal main pulmonary arteries, more severe on the left than on the right, and involves multiple upper and lower lobe pulmonary artery branches. Positive for acute PE with CT evidence of right heart strain (RV/LV Ratio = 2.1) consistent with at least submassive (intermediate risk) PE. The presence of right heart strain has been associated with an increased risk of morbidity and mortality. Please activate Code PE by paging [phone]. 2. Multiple irregular opacities concerning for foci of neoplasm within the lung parenchyma. There is also evidence suggesting pulmonary infarcts in the periphery of the right  lower lobe and areas of patchy apparent pneumonitis in the upper lobes. An area of cavitation is noted in the left lower lobe which may represent developing cavitary pneumonia or possibly cavitary neoplasm. 3. Enlarged pretracheal lymph node concerning for neoplastic etiology given other findings. 4. Several sclerotic foci in upper thoracic vertebral bodies, potentially representing metastatic lesions. No destructive bone lesions. 5. Enlargement of the main pulmonary outflow tract, a finding indicative of pulmonary arterial hypertension. 6. Reflux of contrast into the inferior vena cava and hepatic veins, likely representing a degree of increase in right heart pressure. Aortic Atherosclerosis (ICD10-I70.0). Critical Value/emergent results were called by telephone at the time of interpretation on 06/26/2019 at 1:37 pm to providerJOSHUA LONG , who verbally acknowledged these results. Electronically Signed   By: Lowella Grip III M.D.   On: 06/26/2019 13:37   Dg Chest Portable 1 View  Result Date: 06/26/2019 CLINICAL DATA:  Shortness of breath EXAM: PORTABLE CHEST 1 VIEW COMPARISON:  02/11/2016 FINDINGS: Heart is normal size. Nodular density projects over the right upper lobe and anterior right 2nd rib. This could be bony, but cannot exclude pulmonary nodule. Peripheral opacity in the lingula. No effusions. No acute bony abnormality. IMPRESSION: Nodular density projecting over the right upper lobe and anterior 2nd rib. This could be bony in nature, but cannot exclude pulmonary nodule. Recommend further evaluation with chest CT. Peripheral opacity in the lingula concerning for developing infiltrate/pneumonia. Electronically Signed   By: Rolm Baptise M.D.   On: 06/26/2019 10:59        Scheduled Meds:  albuterol  8 puff Inhalation Once   budesonide (PULMICORT) nebulizer solution  0.5 mg Nebulization BID   Chlorhexidine Gluconate Cloth  6 each Topical Daily   insulin aspart  0-15 Units Subcutaneous  TID WC   insulin aspart  0-5 Units Subcutaneous QHS   insulin detemir  10 Units Subcutaneous Daily   pravastatin  40 mg Oral Daily   predniSONE  40 mg Oral Q breakfast   Continuous Infusions:  azithromycin Stopped (06/26/19 1337)   cefTRIAXone (ROCEPHIN)  IV Stopped (06/26/19 1310)   heparin 1,900 Units/hr (06/27/19 0556)     LOS: 1 day  Time spent: 30 minutes    Zaidin Blyden Darleen Crocker, DO [facility] Pager [phone]  If 7PM-7AM, please contact night-coverage [email or web address] Password [email or web address] 06/27/2019, 10:04 AM

## 2019-06-27 NOTE — Progress Notes (Signed)
Inpatient Diabetes Program Recommendations  AACE/ADA: New Consensus Statement on Inpatient Glycemic Control (2015)  Target Ranges:  Prepandial:   less than 140 mg/dL      Peak postprandial:   less than 180 mg/dL (1-2 hours)      Critically ill patients:  140 - 180 mg/dL   Lab Results  Component Value Date   GLUCAP 321 (H) 06/27/2019   HGBA1C 8.1 (H) 06/26/2019    Review of Glycemic Control  Diabetes history: DM 2 Outpatient Diabetes medications: Invokana 588 mg Daily, Trulicity 0.5 weekly Current orders for Inpatient glycemic control:  Levemir 10 units Daily Novolog 0-15 units tid + hs  Inpatient Diabetes Program Recommendations:    Solumedrol 125 mg once yesterday, 60 mg once yesterday. Now on PO prednisone 40 mg Daily. Noted Supplement: Glucerna ordered.  Will watch trends on current regimen with titration of steroid.  Thanks,  Tama Headings RN, MSN, BC-ADM Inpatient Diabetes Coordinator Team Pager [phone] (8a-5p)

## 2019-06-27 NOTE — Progress Notes (Signed)
[place] for heparin infusion Indication: pulmonary embolus  Allergies  Allergen Reactions  . Adhesive [Tape] Other (See Comments)    Blisters skin, peels off. Please use paper tape.   . Codeine Hives, Itching and Swelling  . Latex Hives    Patient Measurements: Height: 6\' 1"  (185.4 cm) Weight: 223 lb 8.7 oz (101.4 kg) IBW/kg (Calculated) : 79.9 Heparin Dosing Weight: HEPARIN DW (KG): 100.4  Vital Signs: Temp: 98.2 F (36.8 C) (10/14 1634) Temp Source: Oral (10/14 1634) BP: 134/79 (10/14 1900) Pulse Rate: 102 (10/14 1900)  Labs: Recent Labs    06/26/19 1000 06/26/19 1122 06/26/19 1336  06/27/19 0422 06/27/19 1143 06/27/19 1917  HGB 15.8  --   --   --  16.7  --   --   HCT 48.8  --   --   --  54.1*  --   --   PLT 344  --   --   --  330  --   --   APTT  --   --  97*  --   --   --   --   LABPROT  --   --  18.5*  --   --   --   --   INR  --   --  1.6*  --   --   --   --   HEPARINUNFRC  --   --  0.82*   < > 0.35 0.12* 0.27*  CREATININE 1.09  --   --   --  1.10  --   --   TROPONINIHS 341* 352*  --   --   --   --   --    < > = values in this interval not displayed.    Estimated Creatinine Clearance: 89.4 mL/min (by C-G formula based on SCr of 1.1 mg/dL).    Assessment: Pharmacy consulted to dose heparin infusion for this 58 yo male with PE. CT shows extensive b/l PE with R heart strain.   PM Update - HL 0.27 below but approaching goal - No s/s bleeding per RN - No line issues, heparin not paused per RN  Goal of Therapy:  Heparin level 0.3-0.7 units/ml Monitor platelets by anticoagulation protocol: Yes   Plan:  - Give heparin 1500units bolus now - Increase heparin infusion to 2300 units/hr - Recheck HL in 6 hours - Daily CBC/HL - Monitor for s/s of bleeding   Ulice Dash, PharmD, BCPS Clinical Pharmacist  06/27/2019 8:05 PM

## 2019-06-27 NOTE — Progress Notes (Signed)
*  PRELIMINARY RESULTS* Echocardiogram 2D Echocardiogram has been performed.  Leavy Cella 06/27/2019, 4:38 PM

## 2019-06-27 NOTE — Progress Notes (Signed)
PMT consult received and chart reviewed. Palliative provider floated to main campus today but will be at [facility] tomorrow, 10/15 and available to discuss [place] with patient. Thank you.  NO CHARGE  Ihor Dow, [place], FNP-C Palliative Medicine Team  Phone: [phone] Fax: [phone]

## 2019-06-27 NOTE — Progress Notes (Signed)
[place] for heparin infusion Indication: pulmonary embolus  Allergies  Allergen Reactions  . Adhesive [Tape] Other (See Comments)    Blisters skin, peels off. Please use paper tape.   . Codeine Hives, Itching and Swelling  . Latex Hives    Patient Measurements: Height: 6\' 1"  (185.4 cm) Weight: 223 lb 15.8 oz (101.6 kg) IBW/kg (Calculated) : 79.9 Heparin Dosing Weight: HEPARIN DW (KG): 100.4  Vital Signs: BP: 138/83 (10/14 0500) Pulse Rate: 93 (10/14 0500)  Labs: Recent Labs    06/26/19 1000 06/26/19 1122 06/26/19 1336 06/26/19 2012 06/27/19 0422  HGB 15.8  --   --   --  16.7  HCT 48.8  --   --   --  54.1*  PLT 344  --   --   --  330  APTT  --   --  97*  --   --   LABPROT  --   --  18.5*  --   --   INR  --   --  1.6*  --   --   HEPARINUNFRC  --   --  0.82* 0.18* 0.35  CREATININE 1.09  --   --   --  1.10  TROPONINIHS 341* 352*  --   --   --     Estimated Creatinine Clearance: 89.5 mL/min (by C-G formula based on SCr of 1.1 mg/dL).    Assessment: Pharmacy consulted to dose heparin infusion for this 58 yo male with PE. CT shows extensive b/l PE with R heart strain.   Heparin level 0.35 (therapeutic) on gtt at 1900 units/hr. No bleeding noted.  Goal of Therapy:  Heparin level 0.3-0.7 units/ml Monitor platelets by anticoagulation protocol: Yes   Plan:  Continue heparin infusion at 1900 units/hr F/u confirmatory heparin level  Sherlon Handing, PharmD, BCPS CGV Clinical pharmacist phone [phone] 06/27/2019,6:17 AM

## 2019-06-27 NOTE — Progress Notes (Signed)
[place] for heparin infusion Indication: pulmonary embolus  Allergies  Allergen Reactions  . Adhesive [Tape] Other (See Comments)    Blisters skin, peels off. Please use paper tape.   . Codeine Hives, Itching and Swelling  . Latex Hives    Patient Measurements: Height: 6\' 1"  (185.4 cm) Weight: 223 lb 8.7 oz (101.4 kg) IBW/kg (Calculated) : 79.9 Heparin Dosing Weight: HEPARIN DW (KG): 100.4  Vital Signs: Temp: 98.2 F (36.8 C) (10/14 1117) Temp Source: Oral (10/14 1117) BP: 131/87 (10/14 1200) Pulse Rate: 93 (10/14 1230)  Labs: Recent Labs    06/26/19 1000 06/26/19 1122  06/26/19 1336 06/26/19 2012 06/27/19 0422 06/27/19 1143  HGB 15.8  --   --   --   --  16.7  --   HCT 48.8  --   --   --   --  54.1*  --   PLT 344  --   --   --   --  330  --   APTT  --   --   --  97*  --   --   --   LABPROT  --   --   --  18.5*  --   --   --   INR  --   --   --  1.6*  --   --   --   HEPARINUNFRC  --   --    < > 0.82* 0.18* 0.35 0.12*  CREATININE 1.09  --   --   --   --  1.10  --   TROPONINIHS 341* 352*  --   --   --   --   --    < > = values in this interval not displayed.    Estimated Creatinine Clearance: 89.4 mL/min (by C-G formula based on SCr of 1.1 mg/dL).    Assessment: Pharmacy consulted to dose heparin infusion for this 58 yo male with PE. CT shows extensive b/l PE with R heart strain.   10/14 AM UPDATE Heparin level 0.12 IU/mL on gtt at 1900 units/hr-->sub-therapeutic   Goal of Therapy:  Heparin level 0.3-0.7 units/ml Monitor platelets by anticoagulation protocol: Yes   Plan:  Give heparin 3000 units bolus now Increase heparin infusion to 2100 units/hr F/u confirmatory heparin level at [place], Pharm. D. Clinical Pharmacist 06/27/2019 1:05 PM

## 2019-06-28 DIAGNOSIS — F411 Generalized anxiety disorder: Secondary | ICD-10-CM

## 2019-06-28 DIAGNOSIS — Z515 Encounter for palliative care: Secondary | ICD-10-CM

## 2019-06-28 DIAGNOSIS — Z7189 Other specified counseling: Secondary | ICD-10-CM

## 2019-06-28 LAB — CBC
HCT: 47.3 % (ref 39.0–52.0)
Hemoglobin: 15 g/dL (ref 13.0–17.0)
MCH: 27.2 pg (ref 26.0–34.0)
MCHC: 31.7 g/dL (ref 30.0–36.0)
MCV: 85.7 fL (ref 80.0–100.0)
Platelets: 320 10*3/uL (ref 150–400)
RBC: 5.52 MIL/uL (ref 4.22–5.81)
RDW: 13.7 % (ref 11.5–15.5)
WBC: 22.9 10*3/uL — ABNORMAL HIGH (ref 4.0–10.5)
nRBC: 0 % (ref 0.0–0.2)

## 2019-06-28 LAB — GLUCOSE, CAPILLARY
Glucose-Capillary: 219 mg/dL — ABNORMAL HIGH (ref 70–99)
Glucose-Capillary: 470 mg/dL — ABNORMAL HIGH (ref 70–99)
Glucose-Capillary: 276 mg/dL — ABNORMAL HIGH (ref 70–99)
Glucose-Capillary: 256 mg/dL — ABNORMAL HIGH (ref 70–99)
Glucose-Capillary: 320 mg/dL — ABNORMAL HIGH (ref 70–99)

## 2019-06-28 LAB — HEPARIN LEVEL (UNFRACTIONATED)
Heparin Unfractionated: 0.41 IU/mL (ref 0.30–0.70)
Heparin Unfractionated: 0.14 IU/mL — ABNORMAL LOW (ref 0.30–0.70)
Heparin Unfractionated: 0.45 IU/mL (ref 0.30–0.70)

## 2019-06-28 LAB — BASIC METABOLIC PANEL
Anion gap: 11 (ref 5–15)
BUN: 41 mg/dL — ABNORMAL HIGH (ref 6–20)
CO2: 23 mmol/L (ref 22–32)
Calcium: 8.9 mg/dL (ref 8.9–10.3)
Chloride: 102 mmol/L (ref 98–111)
Creatinine, Ser: 0.84 mg/dL (ref 0.61–1.24)
GFR calc Af Amer: >60 mL/min (ref >60)
GFR calc non Af Amer: >60 mL/min (ref >60)
Glucose, Bld: 260 mg/dL — ABNORMAL HIGH (ref 70–99)
Potassium: 4.4 mmol/L (ref 3.5–5.1)
Sodium: 136 mmol/L (ref 135–145)

## 2019-06-28 LAB — GLUCOSE, RANDOM: Glucose, Bld: 462 mg/dL — ABNORMAL HIGH (ref 70–99)

## 2019-06-28 MED ORDER — HEPARIN BOLUS VIA INFUSION
1500.0000 [IU] | Freq: Once | INTRAVENOUS | Status: AC
  Administered 2019-06-28: 1500 [IU] via INTRAVENOUS
  Filled 2019-06-28: qty 1500

## 2019-06-28 MED ORDER — INSULIN ASPART 100 UNIT/ML ~~LOC~~ SOLN
4.0000 [IU] | Freq: Three times a day (TID) | SUBCUTANEOUS | Status: DC
  Administered 2019-06-28 – 2019-07-01 (×8): 4 [IU] via SUBCUTANEOUS

## 2019-06-28 MED ORDER — ALPRAZOLAM 0.25 MG PO TABS
0.2500 mg | ORAL_TABLET | Freq: Three times a day (TID) | ORAL | Status: DC | PRN
  Administered 2019-06-28 – 2019-07-08 (×19): 0.25 mg via ORAL
  Filled 2019-06-28 (×19): qty 1

## 2019-06-28 MED ORDER — INSULIN ASPART 100 UNIT/ML ~~LOC~~ SOLN
0.0000 [IU] | Freq: Three times a day (TID) | SUBCUTANEOUS | Status: DC
  Administered 2019-06-28: 10 [IU] via SUBCUTANEOUS
  Administered 2019-06-28: 20 [IU] via SUBCUTANEOUS
  Administered 2019-06-29: 15 [IU] via SUBCUTANEOUS
  Administered 2019-06-29 (×2): 11 [IU] via SUBCUTANEOUS
  Administered 2019-06-30: 15 [IU] via SUBCUTANEOUS
  Administered 2019-06-30: 20 [IU] via SUBCUTANEOUS
  Administered 2019-06-30: 4 [IU] via SUBCUTANEOUS
  Administered 2019-07-01: 15 [IU] via SUBCUTANEOUS
  Administered 2019-07-01: 08:00:00 11 [IU] via SUBCUTANEOUS
  Administered 2019-07-02: 20 [IU] via SUBCUTANEOUS
  Administered 2019-07-02: 17:00:00 11 [IU] via SUBCUTANEOUS
  Administered 2019-07-02: 15 [IU] via SUBCUTANEOUS
  Administered 2019-07-03: 09:00:00 11 [IU] via SUBCUTANEOUS
  Administered 2019-07-03: 7 [IU] via SUBCUTANEOUS
  Administered 2019-07-03: 11 [IU] via SUBCUTANEOUS
  Administered 2019-07-04: 15 [IU] via SUBCUTANEOUS
  Administered 2019-07-04: 09:00:00 7 [IU] via SUBCUTANEOUS
  Administered 2019-07-04: 11 [IU] via SUBCUTANEOUS
  Administered 2019-07-05: 15 [IU] via SUBCUTANEOUS
  Administered 2019-07-05: 7 [IU] via SUBCUTANEOUS
  Administered 2019-07-05: 20 [IU] via SUBCUTANEOUS
  Administered 2019-07-06 (×3): 7 [IU] via SUBCUTANEOUS
  Administered 2019-07-07: 11 [IU] via SUBCUTANEOUS
  Administered 2019-07-07 – 2019-07-08 (×3): 4 [IU] via SUBCUTANEOUS
  Administered 2019-07-08: 17:00:00 11 [IU] via SUBCUTANEOUS
  Administered 2019-07-08 – 2019-07-09 (×3): 4 [IU] via SUBCUTANEOUS
  Administered 2019-07-09: 11 [IU] via SUBCUTANEOUS

## 2019-06-28 MED ORDER — INSULIN ASPART 100 UNIT/ML ~~LOC~~ SOLN
0.0000 [IU] | Freq: Every day | SUBCUTANEOUS | Status: DC
  Administered 2019-06-28: 5 [IU] via SUBCUTANEOUS
  Administered 2019-06-29: 4 [IU] via SUBCUTANEOUS
  Administered 2019-06-30: 5 [IU] via SUBCUTANEOUS
  Administered 2019-07-01: 2 [IU] via SUBCUTANEOUS
  Administered 2019-07-02 – 2019-07-04 (×2): 4 [IU] via SUBCUTANEOUS
  Administered 2019-07-05 – 2019-07-06 (×2): 3 [IU] via SUBCUTANEOUS
  Administered 2019-07-07: 22:00:00 2 [IU] via SUBCUTANEOUS
  Administered 2019-07-08: 22:00:00 3 [IU] via SUBCUTANEOUS
  Administered 2019-07-09: 21:00:00 2 [IU] via SUBCUTANEOUS

## 2019-06-28 MED ORDER — ALPRAZOLAM 0.25 MG PO TABS
0.2500 mg | ORAL_TABLET | Freq: Once | ORAL | Status: AC
  Administered 2019-06-28: 0.25 mg via ORAL
  Filled 2019-06-28: qty 1

## 2019-06-28 NOTE — Progress Notes (Signed)
[place] for heparin infusion Indication: pulmonary embolus  Allergies  Allergen Reactions  . Adhesive [Tape] Other (See Comments)    Blisters skin, peels off. Please use paper tape.   . Codeine Hives, Itching and Swelling  . Latex Hives    Patient Measurements: Height: 6\' 1"  (185.4 cm) Weight: 225 lb 5 oz (102.2 kg) IBW/kg (Calculated) : 79.9 Heparin Dosing Weight: HEPARIN DW (KG): 100.4  Vital Signs: Temp: 98.3 F (36.8 C) (10/15 0731) Temp Source: Oral (10/15 0731) BP: 123/72 (10/15 0400) Pulse Rate: 98 (10/15 0731)  Labs: Recent Labs    06/26/19 1000 06/26/19 1122 06/26/19 1336  06/27/19 0422 06/27/19 1143 06/27/19 1917 06/28/19 0529  HGB 15.8  --   --   --  16.7  --   --  15.0  HCT 48.8  --   --   --  54.1*  --   --  47.3  PLT 344  --   --   --  330  --   --  320  APTT  --   --  97*  --   --   --   --   --   LABPROT  --   --  18.5*  --   --   --   --   --   INR  --   --  1.6*  --   --   --   --   --   HEPARINUNFRC  --   --  0.82*   < > 0.35 0.12* 0.27* 0.41  CREATININE 1.09  --   --   --  1.10  --   --  0.84  TROPONINIHS 341* 352*  --   --   --   --   --   --    < > = values in this interval not displayed.    Estimated Creatinine Clearance: 117.5 mL/min (by C-G formula based on SCr of 0.84 mg/dL).    Assessment: Pharmacy consulted to dose heparin infusion for this 58 yo male with PE. CT shows extensive b/l PE with R heart strain.   HL therapeutic at 0.41  Goal of Therapy:  Heparin level 0.3-0.7 units/ml Monitor platelets by anticoagulation protocol: Yes   Plan:  Continue heparin infusion at 2300 units/hr Heparin level in 6 hours and daily  Monitor CBC and s/s of bleeding   Margot Ables, PharmD Clinical Pharmacist 06/28/2019 8:07 AM

## 2019-06-28 NOTE — Progress Notes (Signed)
PROGRESS NOTE    Craig Owens  VPX:[id number] DOB: [birth year] DOA: 06/26/2019 PCP: Lucia Gaskins, MD   Brief Narrative:  Per HPI: Craig Owens a 58 y.o.malewith medical history ofCOPD, tobacco abuse, nonobstructive CAD, THC use, remote stroke presenting with acute onset of shortness of breath and chest discomfort approximately 2 hours prior to admission. The patient denies any fevers, chills, headache, neck pain, nausea, vomiting, diarrhea, abdominal pain. He has had a small amount of hemoptysis for 1 day. He denies any hematemesis, hematochezia, melena. Notably, the patient states that he was told that he had "lung cancer" approximately 5 years ago by his primary care provider, but the patient was lost to follow-up and never followed through with work-up. Nevertheless, the patient continued to smoke approximately 2 packs/day for the last 40 years. He uses THC but denies any other illegal drug use. Upon EMS arrival, the patient was noted to be hypoxic with oxygen saturation of 80% on 6 L. The patient was subsequently placed on nonrebreather in the emergency department with oxygen saturations 96-98%. In the emergency department, the patient was afebrile, tachycardicwith HR 110-120, and hypoxic requiring a nonrebreather with oxygen saturation 98%. CT angiogram chest showed pulmonary embolus in the bilateral distal main pulmonary artery with extension into the upper lobes and lower lobes bilateral. There was RV strain.  10/14: Patient appears to be doing well this morning with stable vital signs noted.  He is actually on room air saturating near 100%.  He denies any other symptoms and 2D echocardiogram is pending.  We will plan to increase insulin coverage and wean steroids.  Palliative consulted for goals of care discussion.  10/15: Patient continues to do well and has some minimal anxiety with hemoptysis this morning.  2D echocardiogram with LVEF 55-60%.  Palliative  consultation pending.  Plan to transfer to telemetry.  Assessment & Plan:   Principal Problem:   Acute pulmonary embolus (HCC) Active Problems:   Acute respiratory failure with hypoxia (HCC)   COPD with acute exacerbation (HCC)   Uncontrolled type 2 diabetes mellitus with hyperglycemia (HCC)   Acute hypoxemic respiratory failure secondary to submassive PE -Case discussed with pulmonology who did not feel TPA currently indicated -Continue on IV heparin for total 5 days with end date 10/18 -Continue to monitor closely and likely transfer to telemetry today -2D echocardiogram with LVEF 55-60% -COVID testing negative  Acute COPD exacerbation with possible pneumonia-improving -Continue on Rocephin and azithromycin with procalcitonin elevation noted, plan to complete 5-day course -Continue oral prednisone -Breathing treatments to as needed -Continue Pulmicort  Pulmonary opacities/nodules -Suspicious for cavitary lung cancer and and bony metastasis -We will have follow-up with oncology in the near future, but first goals of care discussion with palliative regarding these findings -Patient was apparently told he had lung cancer 5 years ago but never pursued work-up  Elevated troponin secondary to PE with RV strain -2D echocardiogram with LVEF 55-60% without significant RV issues noted -Does not appear to be clinically significant for ACS  Type 2 diabetes with uncontrolled hyperglycemia -Hemoglobin A1c 8.1% -We will wean steroids today and add Levemir 10 units daily -Continue SSI and carb modified diet and follow carefully  Remote CVA 5 years ago -Hold aspirin and Plavix while on heparin drip -Lipid panel with LDL of 53  THC and tobacco abuse -Urine drug screen with no acute findings -Discussed tobacco cessation and will consider nicotine patch as needed  Chronic pain syndrome -Patient currently on Percocet 5/325 as needed for  pain control -Queried on PMP aware    DVT prophylaxis: Heparin drip Code Status: Full Family Communication: None at bedside Disposition Plan: Plan to continue treatment on IV heparin drip through 10/18.  Continue antibiotics, steroid, and breathing treatments and monitor closely.  SSI.  Plan to transfer to telemetry today.   Consultants:   Palliative  Procedures:   None  Antimicrobials:  Anti-infectives (From admission, onward)   Start     Dose/Rate Route Frequency Ordered Stop   06/26/19 1115  cefTRIAXone (ROCEPHIN) 2 g in sodium chloride 0.9 % 100 mL IVPB     2 g 200 mL/hr over 30 Minutes Intravenous Every 24 hours 06/26/19 1105     06/26/19 1115  azithromycin (ZITHROMAX) 500 mg in sodium chloride 0.9 % 250 mL IVPB     500 mg 250 mL/hr over 60 Minutes Intravenous Every 24 hours 06/26/19 1105         Subjective: Patient seen and evaluated today with some mild hemoptysis and associated anxiety noted.  He has remained stable overnight otherwise.  Remains on nasal cannula oxygen.  Objective: Vitals:   06/28/19 0400 06/28/19 0500 06/28/19 0731 06/28/19 0902  BP: 123/72     Pulse: (!) 102  98   Resp: (!) 28  (!) 28   Temp: 97.8 F (36.6 C)  98.3 F (36.8 C)   TempSrc: Oral  Oral   SpO2: (!) 88%  97% 97%  Weight:  102.2 kg    Height:        Intake/Output Summary (Last 24 hours) at 06/28/2019 1104 Last data filed at 06/28/2019 0400 Gross per 24 hour  Intake 739.73 ml  Output 2375 ml  Net -1635.27 ml   Filed Weights   06/26/19 1815 06/27/19 0500 06/28/19 0500  Weight: 101.6 kg 101.4 kg 102.2 kg    Examination:  General exam: Appears calm and comfortable  Respiratory system: Clear to auscultation. Respiratory effort normal.  Nasal cannula oxygen. Cardiovascular system: S1 & S2 heard, RRR. No JVD, murmurs, rubs, gallops or clicks. No pedal edema. Gastrointestinal system: Abdomen is nondistended, soft and nontender. No organomegaly or masses felt. Normal bowel sounds heard. Central nervous system:  Alert and oriented. No focal neurological deficits. Extremities: Symmetric 5 x 5 power. Skin: No rashes, lesions or ulcers Psychiatry: Judgement and insight appear normal. Mood & affect appropriate.     Data Reviewed: I have personally reviewed following labs and imaging studies  CBC: Recent Labs  Lab 06/26/19 1000 06/27/19 0422 06/28/19 0529  WBC 29.7* 29.2* 22.9*  NEUTROABS 25.1*  --   --   HGB 15.8 16.7 15.0  HCT 48.8 54.1* 47.3  MCV 85.0 88.1 85.7  PLT 344 330 416   Basic Metabolic Panel: Recent Labs  Lab 06/26/19 1000 06/27/19 0422 06/28/19 0529  NA 136 139 136  K 4.4 4.4 4.4  CL 98 102 102  CO2 23 20* 23  GLUCOSE 338* 259* 260*  BUN 31* 41* 41*  CREATININE 1.09 1.10 0.84  CALCIUM 9.5 9.3 8.9   GFR: Estimated Creatinine Clearance: 117.5 mL/min (by C-G formula based on SCr of 0.84 mg/dL). Liver Function Tests: Recent Labs  Lab 06/26/19 1000  AST 20  ALT 20  ALKPHOS 96  BILITOT 0.4  PROT 8.2*  ALBUMIN 3.0*   No results for input(s): LIPASE, AMYLASE in the last 168 hours. No results for input(s): AMMONIA in the last 168 hours. Coagulation Profile: Recent Labs  Lab 06/26/19 1336  INR 1.6*   Cardiac  Enzymes: No results for input(s): CKTOTAL, CKMB, CKMBINDEX, TROPONINI in the last 168 hours. BNP (last 3 results) No results for input(s): PROBNP in the last 8760 hours. HbA1C: Recent Labs    06/26/19 1000  HGBA1C 8.1*   CBG: Recent Labs  Lab 06/27/19 0722 06/27/19 1115 06/27/19 1632 06/27/19 2105 06/28/19 0726  GLUCAP 248* 346* 321* 317* 219*   Lipid Profile: Recent Labs    06/27/19 0422  CHOL 121  HDL 40*  LDLCALC 53  TRIG 138  CHOLHDL 3.0   Thyroid Function Tests: No results for input(s): TSH, T4TOTAL, FREET4, T3FREE, THYROIDAB in the last 72 hours. Anemia Panel: No results for input(s): VITAMINB12, FOLATE, FERRITIN, TIBC, IRON, RETICCTPCT in the last 72 hours. Sepsis Labs: Recent Labs  Lab 06/26/19 1336 06/27/19 0422   PROCALCITON 0.22 0.25    Recent Results (from the past 240 hour(s))  SARS Coronavirus 2 by RT PCR (hospital order, performed in [facility] hospital lab) Nasopharyngeal Nasopharyngeal Swab     Status: None   Collection Time: 06/26/19 11:06 AM   Specimen: Nasopharyngeal Swab  Result Value Ref Range Status   SARS Coronavirus 2 NEGATIVE NEGATIVE Final    Comment: (NOTE) If result is NEGATIVE SARS-CoV-2 target nucleic acids are NOT DETECTED. The SARS-CoV-2 RNA is generally detectable in upper and lower  respiratory specimens during the acute phase of infection. The lowest  concentration of SARS-CoV-2 viral copies this assay can detect is 250  copies / mL. A negative result does not preclude SARS-CoV-2 infection  and should not be used as the sole basis for treatment or other  patient management decisions.  A negative result may occur with  improper specimen collection / handling, submission of specimen other  than nasopharyngeal swab, presence of viral mutation(s) within the  areas targeted by this assay, and inadequate number of viral copies  (<250 copies / mL). A negative result must be combined with clinical  observations, patient history, and epidemiological information. If result is POSITIVE SARS-CoV-2 target nucleic acids are DETECTED. The SARS-CoV-2 RNA is generally detectable in upper and lower  respiratory specimens dur ing the acute phase of infection.  Positive  results are indicative of active infection with SARS-CoV-2.  Clinical  correlation with patient history and other diagnostic information is  necessary to determine patient infection status.  Positive results do  not rule out bacterial infection or co-infection with other viruses. If result is PRESUMPTIVE POSTIVE SARS-CoV-2 nucleic acids MAY BE PRESENT.   A presumptive positive result was obtained on the submitted specimen  and confirmed on repeat testing.  While 2019 novel coronavirus  (SARS-CoV-2) nucleic acids  may be present in the submitted sample  additional confirmatory testing may be necessary for epidemiological  and / or clinical management purposes  to differentiate between  SARS-CoV-2 and other Sarbecovirus currently known to infect humans.  If clinically indicated additional testing with an alternate test  methodology [phone]) is advised. The SARS-CoV-2 RNA is generally  detectable in upper and lower respiratory sp ecimens during the acute  phase of infection. The expected result is Negative. Fact Sheet for Patients:  StrictlyIdeas.no Fact Sheet for Healthcare Providers: BankingDealers.co.za This test is not yet approved or cleared by the [place] FDA and has been authorized for detection and/or diagnosis of SARS-CoV-2 by FDA under an Emergency Use Authorization (EUA).  This EUA will remain in effect (meaning this test can be used) for the duration of the COVID-19 declaration under Section 564(b)(1) of the Act, 21  U.S.C. section 360bbb-3(b)(1), unless the authorization is terminated or revoked sooner. Performed at [facility], [address] [place], [place] [address]   Blood Culture (routine x 2)     Status: None (Preliminary result)   Collection Time: 06/26/19 11:21 AM   Specimen: BLOOD RIGHT ARM  Result Value Ref Range Status   Specimen Description BLOOD RIGHT ARM  Final   Special Requests   Final    BOTTLES DRAWN AEROBIC AND ANAEROBIC Blood Culture adequate volume   Culture   Final    NO GROWTH 2 DAYS Performed at [facility], [address] [place], [place] [address]    Report Status PENDING  Incomplete  Blood Culture (routine x 2)     Status: None (Preliminary result)   Collection Time: 06/26/19 11:32 AM   Specimen: BLOOD RIGHT FOREARM  Result Value Ref Range Status   Specimen Description BLOOD RIGHT FOREARM  Final   Special Requests   Final    BOTTLES DRAWN AEROBIC AND ANAEROBIC Blood Culture adequate volume    Culture   Final    NO GROWTH 2 DAYS Performed at [facility], [address], [place], [place] [address]    Report Status PENDING  Incomplete  MRSA PCR Screening     Status: None   Collection Time: 06/26/19  6:15 PM   Specimen: Nasal Mucosa; Nasopharyngeal  Result Value Ref Range Status   MRSA by PCR NEGATIVE NEGATIVE Final    Comment:        The GeneXpert MRSA Assay (FDA approved for NASAL specimens only), is one component of a comprehensive MRSA colonization surveillance program. It is not intended to diagnose MRSA infection nor to guide or monitor treatment for MRSA infections. Performed at [facility], [address] [place], [place] [address]          Radiology Studies: Ct Angio Chest Pe W And/or Wo Contrast  Result Date: 06/26/2019 CLINICAL DATA:  Shortness of breath EXAM: CT ANGIOGRAPHY CHEST WITH CONTRAST TECHNIQUE: Multidetector CT imaging of the chest was performed using the standard protocol during bolus administration of intravenous contrast. Multiplanar CT image reconstructions and MIPs were obtained to evaluate the vascular anatomy. CONTRAST:  131mL OMNIPAQUE IOHEXOL 350 MG/ML SOLN COMPARISON:  Chest radiograph June 26, 2019 FINDINGS: Cardiovascular: There are pulmonary emboli arising from each distal main pulmonary artery, more pronounced on the left than on the right, with extension of pulmonary emboli throughout multiple upper and lower lobe pulmonary arterial branches bilaterally, more pronounced overall on the left than on the right. The right ventricle to left ventricle diameter ratio is 2.1, indicative of right heart strain. There is no thoracic aortic aneurysm or dissection. Visualized great vessels appear unremarkable. There is no pericardial effusion or pericardial thickening. There are foci of aortic atherosclerosis. The main pulmonary outflow tract measures 3.5 cm in diameter, prominent. Mediastinum/Nodes: Thyroid appears unremarkable. There are scattered  subcentimeter mediastinal lymph nodes. There is a right pretracheal lymph node measuring 1.5 x 1.2 cm. No esophageal lesions are evident. Lungs/Pleura: There is an area of irregular opacity in the posterior segment of the right upper lobe toward the apex measuring 3.1 x 2.9 cm. There is patchy airspace opacity in the periphery of each upper lobe. There is a nodular opacity in the posterior segment of the right upper lobe seen on axial slice 49 series 6 measuring 1.0 x 1.0 cm. A nodular opacity abuts the pleura in the posterior segment of the right upper lobe measuring 0.7 cm. There  is ill-defined airspace opacity in the lingula and anterior segment of the right lower lobe. There is irregular opacity with early cavitation in the anterior and lateral segments of the left lower lobe measuring 4.8 x 2.9 cm. There is a nodular opacity slightly inferior to this area of apparent cavitation measuring 1.6 x 1.7 cm. There are areas of patchy atelectasis in the lung bases. A nodular opacity in the lateral segment left lower lobe measures 0.9 x 0.9 cm. There are suspected pulmonary infarcts in the right lower lobe peripherally. These areas are wedge-shaped. There are no evident pleural effusions. Upper Abdomen: There is atherosclerotic plaque in the upper abdominal aorta. There is reflux of contrast into the inferior vena cava and hepatic veins. Visualized upper abdominal structures otherwise appear unremarkable. Musculoskeletal: Sclerotic foci are noted in several upper thoracic vertebral bodies. No lytic or destructive bone lesions are evident. No chest wall lesions evident. Review of the MIP images confirms the above findings. IMPRESSION: 1. Extensive bilateral pulmonary embolus, more severe on the left than on the right which arises from the distal main pulmonary arteries, more severe on the left than on the right, and involves multiple upper and lower lobe pulmonary artery branches. Positive for acute PE with CT evidence  of right heart strain (RV/LV Ratio = 2.1) consistent with at least submassive (intermediate risk) PE. The presence of right heart strain has been associated with an increased risk of morbidity and mortality. Please activate Code PE by paging [phone]. 2. Multiple irregular opacities concerning for foci of neoplasm within the lung parenchyma. There is also evidence suggesting pulmonary infarcts in the periphery of the right lower lobe and areas of patchy apparent pneumonitis in the upper lobes. An area of cavitation is noted in the left lower lobe which may represent developing cavitary pneumonia or possibly cavitary neoplasm. 3. Enlarged pretracheal lymph node concerning for neoplastic etiology given other findings. 4. Several sclerotic foci in upper thoracic vertebral bodies, potentially representing metastatic lesions. No destructive bone lesions. 5. Enlargement of the main pulmonary outflow tract, a finding indicative of pulmonary arterial hypertension. 6. Reflux of contrast into the inferior vena cava and hepatic veins, likely representing a degree of increase in right heart pressure. Aortic Atherosclerosis (ICD10-I70.0). Critical Value/emergent results were called by telephone at the time of interpretation on 06/26/2019 at 1:37 pm to providerJOSHUA LONG , who verbally acknowledged these results. Electronically Signed   By: Lowella Grip III M.D.   On: 06/26/2019 13:37        Scheduled Meds: . albuterol  8 puff Inhalation Once  . budesonide (PULMICORT) nebulizer solution  0.5 mg Nebulization BID  . Chlorhexidine Gluconate Cloth  6 each Topical Daily  . feeding supplement (GLUCERNA SHAKE)  237 mL Oral TID BM  . insulin aspart  0-15 Units Subcutaneous TID WC  . insulin aspart  0-5 Units Subcutaneous QHS  . insulin detemir  10 Units Subcutaneous Daily  . predniSONE  40 mg Oral Q breakfast  . varenicline  0.5 mg Oral BID   Continuous Infusions: . azithromycin Stopped (06/27/19 1324)  .  cefTRIAXone (ROCEPHIN)  IV Stopped (06/27/19 1119)  . heparin 2,300 Units/hr (06/28/19 0303)     LOS: 2 days    Time spent: 30 minutes    Pratik Darleen Crocker, DO [facility] Pager [phone]  If 7PM-7AM, please contact night-coverage [email or web address] Password [email or web address] 06/28/2019, 11:04 AM

## 2019-06-28 NOTE — Progress Notes (Signed)
[place] for heparin infusion Indication: pulmonary embolus  Allergies  Allergen Reactions  . Adhesive [Tape] Other (See Comments)    Blisters skin, peels off. Please use paper tape.   . Codeine Hives, Itching and Swelling  . Latex Hives    Patient Measurements: Height: 6\' 1"  (185.4 cm) Weight: 225 lb 5 oz (102.2 kg) IBW/kg (Calculated) : 79.9 Heparin Dosing Weight: HEPARIN DW (KG): 100.4  Vital Signs: Temp: 97.9 F (36.6 C) (10/15 1130) Temp Source: Oral (10/15 1130) BP: 119/87 (10/15 1200) Pulse Rate: 100 (10/15 1200)  Labs: Recent Labs    06/26/19 1000 06/26/19 1122 06/26/19 1336  06/27/19 0422  06/27/19 1917 06/28/19 0529 06/28/19 1329  HGB 15.8  --   --   --  16.7  --   --  15.0  --   HCT 48.8  --   --   --  54.1*  --   --  47.3  --   PLT 344  --   --   --  330  --   --  320  --   APTT  --   --  97*  --   --   --   --   --   --   LABPROT  --   --  18.5*  --   --   --   --   --   --   INR  --   --  1.6*  --   --   --   --   --   --   HEPARINUNFRC  --   --  0.82*   < > 0.35   < > 0.27* 0.41 0.14*  CREATININE 1.09  --   --   --  1.10  --   --  0.84  --   TROPONINIHS 341* 352*  --   --   --   --   --   --   --    < > = values in this interval not displayed.    Estimated Creatinine Clearance: 117.5 mL/min (by C-G formula based on SCr of 0.84 mg/dL).    Assessment: Pharmacy consulted to dose heparin infusion for this 58 yo male with PE. CT shows extensive b/l PE with R heart strain.   HL subtherapeutic at 0.14 despite previous level being therapeutic at 0.41.  Verified with nursing that heparin has been running continuously with no breaks.  Will be cautious with increase in infusion.  Goal of Therapy:  Heparin level 0.3-0.7 units/ml Monitor platelets by anticoagulation protocol: Yes   Plan:  Rebolus 1500 units x 1  Increase heparin infusion to 2500 units/hr Heparin level in 6 hours and daily  Monitor CBC and s/s of  bleeding   Margot Ables, PharmD Clinical Pharmacist 06/28/2019 2:10 PM

## 2019-06-28 NOTE — Progress Notes (Signed)
[place] for heparin infusion Indication: pulmonary embolus  Allergies  Allergen Reactions  . Adhesive [Tape] Other (See Comments)    Blisters skin, peels off. Please use paper tape.   . Codeine Hives, Itching and Swelling  . Latex Hives    Patient Measurements: Height: 6\' 1"  (185.4 cm) Weight: 225 lb 5 oz (102.2 kg) IBW/kg (Calculated) : 79.9 Heparin Dosing Weight: HEPARIN DW (KG): 100.4  Vital Signs: Temp: 97.7 F (36.5 C) (10/15 2000) Temp Source: Oral (10/15 2000) BP: 128/77 (10/15 1800) Pulse Rate: 100 (10/15 2100)  Labs: Recent Labs    06/26/19 1000 06/26/19 1122 06/26/19 1336  06/27/19 0422  06/28/19 0529 06/28/19 1329 06/28/19 2036  HGB 15.8  --   --   --  16.7  --  15.0  --   --   HCT 48.8  --   --   --  54.1*  --  47.3  --   --   PLT 344  --   --   --  330  --  320  --   --   APTT  --   --  97*  --   --   --   --   --   --   LABPROT  --   --  18.5*  --   --   --   --   --   --   INR  --   --  1.6*  --   --   --   --   --   --   HEPARINUNFRC  --   --  0.82*   < > 0.35   < > 0.41 0.14* 0.45  CREATININE 1.09  --   --   --  1.10  --  0.84  --   --   TROPONINIHS 341* 352*  --   --   --   --   --   --   --    < > = values in this interval not displayed.    Estimated Creatinine Clearance: 117.5 mL/min (by C-G formula based on SCr of 0.84 mg/dL).    Assessment: Pharmacy consulted to dose heparin infusion for this 58 yo male with PE. CT shows extensive b/l PE with R heart strain.   HL subtherapeutic at 0.14 despite previous level being therapeutic at 0.41.  Verified with nursing that heparin has been running continuously with no breaks.  Will be cautious with increase in infusion.  PM update  HL is 0.45, therapeutic  No line or bleeding issues per RN    Goal of Therapy:  Heparin level 0.3-0.7 units/ml Monitor platelets by anticoagulation protocol: Yes   Plan:   Continue  heparin infusion at 2500  units/hr  Confirmatory heparin level with AM labs   Monitor CBC and s/s of bleeding   Royetta Asal, PharmD, BCPS 06/28/2019 10:40 PM

## 2019-06-28 NOTE — Progress Notes (Signed)
Results for JERICO, GRISSO (MRN [id number]) as of 06/28/2019 13:24  Ref. Range 06/27/2019 11:15 06/27/2019 16:32 06/27/2019 21:05 06/28/2019 07:26 06/28/2019 11:29  Glucose-Capillary Latest Ref Range: 70 - 99 mg/dL 346 (H) 321 (H) 317 (H) 219 (H) 470 (H)  Noted that CBGs have been greater than 200 mg/dl. Noted that CBG at 11:29 today is 470 mg/dl.   Recommend increasing Levemir to 15 units daily and continue Novolog correction scale and Novolog 4 units TID with meals as ordered if blood sugars continue to be elevated and while on steroids.   Harvel Ricks RN BSN CDE Diabetes Coordinator Pager: [phone]  8am-5pm

## 2019-06-28 NOTE — Progress Notes (Signed)
Visited with Patient and he expressed concern for spiritual support and prayer. Patient said "I have and a daughter and brother but they have not visited me, but my major concern while I am in the hospital is for someone to care for my 15 dogs and 2 cats." Patient expressed feelings of sadness that after he decided to quit smoking after forty years a week ago, he's now experiencing all the sicknesses. He is concern about managing emotions of life circumstances while seeking to maintain hope.
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
## 2019-06-29 LAB — BASIC METABOLIC PANEL
Anion gap: 9 (ref 5–15)
BUN: 31 mg/dL — ABNORMAL HIGH (ref 6–20)
CO2: 25 mmol/L (ref 22–32)
Calcium: 8.7 mg/dL — ABNORMAL LOW (ref 8.9–10.3)
Chloride: 102 mmol/L (ref 98–111)
Creatinine, Ser: 0.69 mg/dL (ref 0.61–1.24)
GFR calc Af Amer: >60 mL/min (ref >60)
GFR calc non Af Amer: >60 mL/min (ref >60)
Glucose, Bld: 190 mg/dL — ABNORMAL HIGH (ref 70–99)
Potassium: 4.1 mmol/L (ref 3.5–5.1)
Sodium: 136 mmol/L (ref 135–145)

## 2019-06-29 LAB — CBC
HCT: 45.7 % (ref 39.0–52.0)
Hemoglobin: 14 g/dL (ref 13.0–17.0)
MCH: 26.8 pg (ref 26.0–34.0)
MCHC: 30.6 g/dL (ref 30.0–36.0)
MCV: 87.5 fL (ref 80.0–100.0)
Platelets: 272 10*3/uL (ref 150–400)
RBC: 5.22 MIL/uL (ref 4.22–5.81)
RDW: 13.6 % (ref 11.5–15.5)
WBC: 18.5 10*3/uL — ABNORMAL HIGH (ref 4.0–10.5)
nRBC: 0 % (ref 0.0–0.2)

## 2019-06-29 LAB — GLUCOSE, CAPILLARY
Glucose-Capillary: 265 mg/dL — ABNORMAL HIGH (ref 70–99)
Glucose-Capillary: 281 mg/dL — ABNORMAL HIGH (ref 70–99)
Glucose-Capillary: 299 mg/dL — ABNORMAL HIGH (ref 70–99)
Glucose-Capillary: 308 mg/dL — ABNORMAL HIGH (ref 70–99)
Glucose-Capillary: 341 mg/dL — ABNORMAL HIGH (ref 70–99)

## 2019-06-29 LAB — HEPARIN LEVEL (UNFRACTIONATED): Heparin Unfractionated: 0.48 IU/mL (ref 0.30–0.70)

## 2019-06-29 MED ORDER — ACETYLCYSTEINE 20 % IN SOLN
2.0000 mL | Freq: Three times a day (TID) | RESPIRATORY_TRACT | Status: DC
  Administered 2019-06-29 – 2019-07-06 (×17): 2 mL via RESPIRATORY_TRACT
  Filled 2019-06-29 (×17): qty 4

## 2019-06-29 MED ORDER — INSULIN DETEMIR 100 UNIT/ML ~~LOC~~ SOLN
15.0000 [IU] | Freq: Every day | SUBCUTANEOUS | Status: DC
  Administered 2019-06-29: 15 [IU] via SUBCUTANEOUS
  Filled 2019-06-29 (×4): qty 0.15

## 2019-06-29 NOTE — Progress Notes (Signed)
PROGRESS NOTE    BERKLEY CRONKRIGHT  IZT:[id number] DOB: Feb 10, 1961 DOA: 06/26/2019 PCP: Lucia Gaskins, MD   Brief Narrative:  Per HPI: [name] a 58 y.o.malewith medical history ofCOPD, tobacco abuse, nonobstructive CAD, THC use, remote stroke presenting with acute onset of shortness of breath and chest discomfort approximately 2 hours prior to admission. The patient denies any fevers, chills, headache, neck pain, nausea, vomiting, diarrhea, abdominal pain. He has had a small amount of hemoptysis for 1 day. He denies any hematemesis, hematochezia, melena. Notably, the patient states that he was told that he had "lung cancer" approximately 5 years ago by his primary care provider, but the patient was lost to follow-up and never followed through with work-up. Nevertheless, the patient continued to smoke approximately 2 packs/day for the last 40 years. He uses THC but denies any other illegal drug use. Upon EMS arrival, the patient was noted to be hypoxic with oxygen saturation of 80% on 6 L. The patient was subsequently placed on nonrebreather in the emergency department with oxygen saturations 96-98%. In the emergency department, the patient was afebrile, tachycardicwith HR 110-120, and hypoxic requiring a nonrebreather with oxygen saturation 98%. CT angiogram chest showed pulmonary embolus in the bilateral distal main pulmonary artery with extension into the upper lobes and lower lobes bilateral. There was RV strain.  10/14:Patient appears to be doing well this morning with stable vital signs noted. He is actually on room air saturating near 100%. He denies any other symptoms and 2D echocardiogram is pending. We will plan to increase insulin coverage and wean steroids. Palliative consulted for goals of care discussion.  10/15: Patient continues to do well and has some minimal anxiety with hemoptysis this morning.  2D echocardiogram with LVEF 55-60%.  Palliative  consultation pending.  Plan to transfer to telemetry.  10/16: Patient continues to have excessive sputum output and appears congested.  He is only minimally anxious, but likes to stay on his nonrebreather mask.  No other acute events otherwise noted.  Blood glucose levels are more stable with insulin adjustments yesterday.  We will plan to add chest physiotherapy Mucomyst today.  Assessment & Plan:   Principal Problem:   Acute pulmonary embolus (HCC) Active Problems:   Acute respiratory failure with hypoxia (HCC)   COPD with acute exacerbation (HCC)   Uncontrolled type 2 diabetes mellitus with hyperglycemia ([place])   Palliative care by specialist   Goals of care, counseling/discussion   Anxiety state   Acute hypoxemic respiratory failure secondary to submassive PE -Case discussed with pulmonology who did not feel TPA currently indicated -Continue on IV heparin for total 5 days with end date 10/18 -Continue to monitor closely and likely transfer to telemetry today -2D echocardiogram with LVEF 55-60%, normal RV size, but not well visualized -COVID testing negative  Acute COPD exacerbation with possible pneumonia-improving -Continue on Rocephin and azithromycin with procalcitonin elevation noted, plan to complete 5-day course -Continue oral prednisone and monitor blood glucose closely -Breathing treatments to as needed -Continue Pulmicort -Add Mucomyst as well as flutter valve and chest PT for congestion  Pulmonary opacities/nodules -Suspicious for cavitary lung cancer and and bony metastasis -We will have follow-up with oncology in the near future, patient would like to remain aggressive with care -Patient was apparently told he had lung cancer 5 years ago but never pursued work-up  Elevated troponin secondary to PE with RV strain -2D echocardiogram with LVEF 55-60% without significant RV issues noted -Does not appear to be clinically significant  for ACS  Type 2 diabetes with  uncontrolled hyperglycemia -Hemoglobin A1c 8.1% -We will increase Levemir dosing to 15 units daily today. -Continue SSI and carb modified diet and follow carefully  Remote CVA 5 years ago -Hold aspirin and Plavix while on heparin drip -Lipid panel with LDL of 53  THC and tobacco abuse -Urine drug screen with no acute findings -Discussed tobacco cessation and will consider nicotine patch as needed  Chronic pain syndrome -Patient currently on Percocet 5/325 as needed for pain control -Queried on PMP aware   DVT prophylaxis:Heparin drip Code Status:Full Family Communication:None at bedside Disposition Plan:Plan to continue treatment on IV heparin drip through 10/18. Continue antibiotics, steroid, and breathing treatments and monitor closely. SSI.  Plan to transfer to telemetry today.   Consultants:  Palliative  Procedures:  None  Antimicrobials:  Anti-infectives (From admission, onward)   Start     Dose/Rate Route Frequency Ordered Stop   06/26/19 1115  cefTRIAXone (ROCEPHIN) 2 g in sodium chloride 0.9 % 100 mL IVPB     2 g 200 mL/hr over 30 Minutes Intravenous Every 24 hours 06/26/19 1105     06/26/19 1115  azithromycin (ZITHROMAX) 500 mg in sodium chloride 0.9 % 250 mL IVPB     500 mg 250 mL/hr over 60 Minutes Intravenous Every 24 hours 06/26/19 1105         Subjective: Patient seen and evaluated today with no new acute complaints or concerns. No acute concerns or events noted overnight.  He has had some mild anxiety with tachypnea and increased sputum production.  Objective: Vitals:   06/29/19 0700 06/29/19 0756 06/29/19 0830 06/29/19 0833  BP:      Pulse: 88     Resp: (!) 34     Temp:  97.9 F (36.6 C)    TempSrc:  Axillary    SpO2: 97%  98% 97%  Weight:      Height:        Intake/Output Summary (Last 24 hours) at 06/29/2019 0932 Last data filed at 06/29/2019 0911 Gross per 24 hour  Intake 936.51 ml  Output 2150 ml  Net -1213.49 ml    Filed Weights   06/26/19 1815 06/27/19 0500 06/28/19 0500  Weight: 101.6 kg 101.4 kg 102.2 kg    Examination:  General exam: Appears calm and comfortable  Respiratory system: Clear to auscultation. Respiratory effort normal.  On nonrebreather mask. Cardiovascular system: S1 & S2 heard, RRR. No JVD, murmurs, rubs, gallops or clicks. No pedal edema. Gastrointestinal system: Abdomen is nondistended, soft and nontender. No organomegaly or masses felt. Normal bowel sounds heard. Central nervous system: Alert and oriented. No focal neurological deficits. Extremities: Symmetric 5 x 5 power. Skin: No rashes, lesions or ulcers Psychiatry: Judgement and insight appear normal. Mood & affect appropriate.     Data Reviewed: I have personally reviewed following labs and imaging studies  CBC: Recent Labs  Lab 06/26/19 1000 06/27/19 0422 06/28/19 0529 06/29/19 0432  WBC 29.7* 29.2* 22.9* 18.5*  NEUTROABS 25.1*  --   --   --   HGB 15.8 16.7 15.0 14.0  HCT 48.8 54.1* 47.3 45.7  MCV 85.0 88.1 85.7 87.5  PLT 344 330 320 628   Basic Metabolic Panel: Recent Labs  Lab 06/26/19 1000 06/27/19 0422 06/28/19 0529 06/28/19 1329 06/29/19 0432  NA 136 139 136  --  136  K 4.4 4.4 4.4  --  4.1  CL 98 102 102  --  102  CO2 23 20*  23  --  25  GLUCOSE 338* 259* 260* 462* 190*  BUN 31* 41* 41*  --  31*  CREATININE 1.09 1.10 0.84  --  0.69  CALCIUM 9.5 9.3 8.9  --  8.7*   GFR: Estimated Creatinine Clearance: 123.3 mL/min (by C-G formula based on SCr of 0.69 mg/dL). Liver Function Tests: Recent Labs  Lab 06/26/19 1000  AST 20  ALT 20  ALKPHOS 96  BILITOT 0.4  PROT 8.2*  ALBUMIN 3.0*   No results for input(s): LIPASE, AMYLASE in the last 168 hours. No results for input(s): AMMONIA in the last 168 hours. Coagulation Profile: Recent Labs  Lab 06/26/19 1336  INR 1.6*   Cardiac Enzymes: No results for input(s): CKTOTAL, CKMB, CKMBINDEX, TROPONINI in the last 168 hours. BNP (last 3  results) No results for input(s): PROBNP in the last 8760 hours. HbA1C: Recent Labs    06/26/19 1000  HGBA1C 8.1*   CBG: Recent Labs  Lab 06/28/19 1627 06/28/19 1938 06/28/19 2305 06/29/19 0054 06/29/19 0758  GLUCAP 276* 256* 320* 265* 281*   Lipid Profile: Recent Labs    06/27/19 0422  CHOL 121  HDL 40*  LDLCALC 53  TRIG 138  CHOLHDL 3.0   Thyroid Function Tests: No results for input(s): TSH, T4TOTAL, FREET4, T3FREE, THYROIDAB in the last 72 hours. Anemia Panel: No results for input(s): VITAMINB12, FOLATE, FERRITIN, TIBC, IRON, RETICCTPCT in the last 72 hours. Sepsis Labs: Recent Labs  Lab 06/26/19 1336 06/27/19 0422  PROCALCITON 0.22 0.25    Recent Results (from the past 240 hour(s))  SARS Coronavirus 2 by RT PCR (hospital order, performed in [facility] hospital lab) Nasopharyngeal Nasopharyngeal Swab     Status: None   Collection Time: 06/26/19 11:06 AM   Specimen: Nasopharyngeal Swab  Result Value Ref Range Status   SARS Coronavirus 2 NEGATIVE NEGATIVE Final    Comment: (NOTE) If result is NEGATIVE SARS-CoV-2 target nucleic acids are NOT DETECTED. The SARS-CoV-2 RNA is generally detectable in upper and lower  respiratory specimens during the acute phase of infection. The lowest  concentration of SARS-CoV-2 viral copies this assay can detect is 250  copies / mL. A negative result does not preclude SARS-CoV-2 infection  and should not be used as the sole basis for treatment or other  patient management decisions.  A negative result may occur with  improper specimen collection / handling, submission of specimen other  than nasopharyngeal swab, presence of viral mutation(s) within the  areas targeted by this assay, and inadequate number of viral copies  (<250 copies / mL). A negative result must be combined with clinical  observations, patient history, and epidemiological information. If result is POSITIVE SARS-CoV-2 target nucleic acids are DETECTED.  The SARS-CoV-2 RNA is generally detectable in upper and lower  respiratory specimens dur ing the acute phase of infection.  Positive  results are indicative of active infection with SARS-CoV-2.  Clinical  correlation with patient history and other diagnostic information is  necessary to determine patient infection status.  Positive results do  not rule out bacterial infection or co-infection with other viruses. If result is PRESUMPTIVE POSTIVE SARS-CoV-2 nucleic acids MAY BE PRESENT.   A presumptive positive result was obtained on the submitted specimen  and confirmed on repeat testing.  While 2019 novel coronavirus  (SARS-CoV-2) nucleic acids may be present in the submitted sample  additional confirmatory testing may be necessary for epidemiological  and / or clinical management purposes  to differentiate between  SARS-CoV-2 and other Sarbecovirus currently known to infect humans.  If clinically indicated additional testing with an alternate test  methodology [phone]) is advised. The SARS-CoV-2 RNA is generally  detectable in upper and lower respiratory sp ecimens during the acute  phase of infection. The expected result is Negative. Fact Sheet for Patients:  StrictlyIdeas.no Fact Sheet for Healthcare Providers: BankingDealers.co.za This test is not yet approved or cleared by the [place] FDA and has been authorized for detection and/or diagnosis of SARS-CoV-2 by FDA under an Emergency Use Authorization (EUA).  This EUA will remain in effect (meaning this test can be used) for the duration of the COVID-19 declaration under Section 564(b)(1) of the Act, 21 U.S.C. section 360bbb-3(b)(1), unless the authorization is terminated or revoked sooner. Performed at [facility], [address], [place], [place] [address]   Blood Culture (routine x 2)     Status: None (Preliminary result)   Collection Time: 06/26/19 11:21 AM   Specimen:  BLOOD RIGHT ARM  Result Value Ref Range Status   Specimen Description BLOOD RIGHT ARM  Final   Special Requests   Final    BOTTLES DRAWN AEROBIC AND ANAEROBIC Blood Culture adequate volume   Culture   Final    NO GROWTH 3 DAYS Performed at [facility], [address], [place], [place] [address]    Report Status PENDING  Incomplete  Blood Culture (routine x 2)     Status: None (Preliminary result)   Collection Time: 06/26/19 11:32 AM   Specimen: BLOOD RIGHT FOREARM  Result Value Ref Range Status   Specimen Description BLOOD RIGHT FOREARM  Final   Special Requests   Final    BOTTLES DRAWN AEROBIC AND ANAEROBIC Blood Culture adequate volume   Culture   Final    NO GROWTH 3 DAYS Performed at [facility], [address], [place], [place] [address]    Report Status PENDING  Incomplete  MRSA PCR Screening     Status: None   Collection Time: 06/26/19  6:15 PM   Specimen: Nasal Mucosa; Nasopharyngeal  Result Value Ref Range Status   MRSA by PCR NEGATIVE NEGATIVE Final    Comment:        The GeneXpert MRSA Assay (FDA approved for NASAL specimens only), is one component of a comprehensive MRSA colonization surveillance program. It is not intended to diagnose MRSA infection nor to guide or monitor treatment for MRSA infections. Performed at [facility], [address], [place], [place] [address]          Radiology Studies: No results found.      Scheduled Meds: . acetylcysteine  4 mL Nebulization TID  . albuterol  8 puff Inhalation Once  . budesonide (PULMICORT) nebulizer solution  0.5 mg Nebulization BID  . Chlorhexidine Gluconate Cloth  6 each Topical Daily  . feeding supplement (GLUCERNA SHAKE)  237 mL Oral TID BM  . insulin aspart  0-20 Units Subcutaneous TID WC  . insulin aspart  0-5 Units Subcutaneous QHS  . insulin aspart  4 Units Subcutaneous TID WC  . insulin detemir  15 Units Subcutaneous Daily  . predniSONE  40 mg Oral Q breakfast  . varenicline  0.5 mg  Oral BID   Continuous Infusions: . azithromycin 500 mg (06/28/19 1221)  . cefTRIAXone (ROCEPHIN)  IV 2 g (06/28/19 1221)  . heparin 2,500 Units/hr (06/29/19 0700)     LOS: 3 days    Time spent: 30 minutes    Andrian Urbach Darleen Crocker, DO  [facility] Pager [phone]  If 7PM-7AM, please contact night-coverage [email or web address] Password [email or web address] 06/29/2019, 9:32 AM

## 2019-06-29 NOTE — Progress Notes (Signed)
[place] for heparin infusion Indication: pulmonary embolus  Allergies  Allergen Reactions  . Adhesive [Tape] Other (See Comments)    Blisters skin, peels off. Please use paper tape.   . Codeine Hives, Itching and Swelling  . Latex Hives    Patient Measurements: Height: 6\' 1"  (185.4 cm) Weight: 225 lb 5 oz (102.2 kg) IBW/kg (Calculated) : 79.9 Heparin Dosing Weight: HEPARIN DW (KG): 100.4  Vital Signs: Temp: 97.9 F (36.6 C) (10/16 0756) Temp Source: Axillary (10/16 0756) BP: 101/67 (10/16 0400) Pulse Rate: 88 (10/16 0700)  Labs: Recent Labs    06/26/19 1000 06/26/19 1122 06/26/19 1336  06/27/19 0422  06/28/19 0529 06/28/19 1329 06/28/19 2036 06/29/19 0432  HGB 15.8  --   --   --  16.7  --  15.0  --   --  14.0  HCT 48.8  --   --   --  54.1*  --  47.3  --   --  45.7  PLT 344  --   --   --  330  --  320  --   --  272  APTT  --   --  97*  --   --   --   --   --   --   --   LABPROT  --   --  18.5*  --   --   --   --   --   --   --   INR  --   --  1.6*  --   --   --   --   --   --   --   HEPARINUNFRC  --   --  0.82*   < > 0.35   < > 0.41 0.14* 0.45 0.48  CREATININE 1.09  --   --   --  1.10  --  0.84  --   --  0.69  TROPONINIHS 341* 352*  --   --   --   --   --   --   --   --    < > = values in this interval not displayed.    Estimated Creatinine Clearance: 123.3 mL/min (by C-G formula based on SCr of 0.69 mg/dL).    Assessment: Pharmacy consulted to dose heparin infusion for this 58 yo male with PE. CT shows extensive b/l PE with R heart strain.   HLis therapeutic at 0.48.    Goal of Therapy:  Heparin level 0.3-0.7 units/ml Monitor platelets by anticoagulation protocol: Yes   Plan:  Continue heparin infusion to 2500 units/hr Heparin level  daily  Monitor CBC and s/s of bleeding   Isac Sarna, BS [place], [place] Clinical Pharmacist Pager [phone] 06/29/2019 8:14 AM

## 2019-06-29 NOTE — Care Management Important Message (Signed)
Important Message  Patient Details  Name: Craig Owens MRN: [id number] Date of Birth: December 16, 1960   Medicare Important Message Given:  Yes     [name] 06/29/2019, 3:38 PM

## 2019-06-30 DIAGNOSIS — G894 Chronic pain syndrome: Secondary | ICD-10-CM

## 2019-06-30 DIAGNOSIS — I248 Other forms of acute ischemic heart disease: Secondary | ICD-10-CM

## 2019-06-30 DIAGNOSIS — I2699 Other pulmonary embolism without acute cor pulmonale: Principal | ICD-10-CM

## 2019-06-30 DIAGNOSIS — I2609 Other pulmonary embolism with acute cor pulmonale: Secondary | ICD-10-CM

## 2019-06-30 DIAGNOSIS — J9601 Acute respiratory failure with hypoxia: Secondary | ICD-10-CM

## 2019-06-30 DIAGNOSIS — J189 Pneumonia, unspecified organism: Secondary | ICD-10-CM | POA: Diagnosis present

## 2019-06-30 DIAGNOSIS — C3490 Malignant neoplasm of unspecified part of unspecified bronchus or lung: Secondary | ICD-10-CM | POA: Diagnosis present

## 2019-06-30 DIAGNOSIS — J441 Chronic obstructive pulmonary disease with (acute) exacerbation: Secondary | ICD-10-CM

## 2019-06-30 LAB — CBC
HCT: 43.6 % (ref 39.0–52.0)
Hemoglobin: 13.4 g/dL (ref 13.0–17.0)
MCH: 26.7 pg (ref 26.0–34.0)
MCHC: 30.7 g/dL (ref 30.0–36.0)
MCV: 86.9 fL (ref 80.0–100.0)
Platelets: 279 10*3/uL (ref 150–400)
RBC: 5.02 MIL/uL (ref 4.22–5.81)
RDW: 13.8 % (ref 11.5–15.5)
WBC: 16.4 10*3/uL — ABNORMAL HIGH (ref 4.0–10.5)
nRBC: 0 % (ref 0.0–0.2)

## 2019-06-30 LAB — GLUCOSE, CAPILLARY
Glucose-Capillary: 197 mg/dL — ABNORMAL HIGH (ref 70–99)
Glucose-Capillary: 376 mg/dL — ABNORMAL HIGH (ref 70–99)
Glucose-Capillary: 335 mg/dL — ABNORMAL HIGH (ref 70–99)
Glucose-Capillary: 376 mg/dL — ABNORMAL HIGH (ref 70–99)

## 2019-06-30 LAB — BASIC METABOLIC PANEL
Anion gap: 8 (ref 5–15)
BUN: 23 mg/dL — ABNORMAL HIGH (ref 6–20)
CO2: 25 mmol/L (ref 22–32)
Calcium: 8.7 mg/dL — ABNORMAL LOW (ref 8.9–10.3)
Chloride: 102 mmol/L (ref 98–111)
Creatinine, Ser: 0.58 mg/dL — ABNORMAL LOW (ref 0.61–1.24)
GFR calc Af Amer: >60 mL/min (ref >60)
GFR calc non Af Amer: >60 mL/min (ref >60)
Glucose, Bld: 203 mg/dL — ABNORMAL HIGH (ref 70–99)
Potassium: 4.2 mmol/L (ref 3.5–5.1)
Sodium: 135 mmol/L (ref 135–145)

## 2019-06-30 LAB — HEPARIN LEVEL (UNFRACTIONATED): Heparin Unfractionated: 0.54 IU/mL (ref 0.30–0.70)

## 2019-06-30 MED ORDER — IPRATROPIUM-ALBUTEROL 0.5-2.5 (3) MG/3ML IN SOLN
3.0000 mL | Freq: Four times a day (QID) | RESPIRATORY_TRACT | Status: DC
  Administered 2019-06-30 – 2019-07-02 (×10): 3 mL via RESPIRATORY_TRACT
  Filled 2019-06-30 (×10): qty 3

## 2019-06-30 MED ORDER — METHYLPREDNISOLONE SODIUM SUCC 125 MG IJ SOLR
60.0000 mg | Freq: Two times a day (BID) | INTRAMUSCULAR | Status: DC
  Administered 2019-06-30 – 2019-07-01 (×4): 60 mg via INTRAVENOUS
  Filled 2019-06-30 (×4): qty 2

## 2019-06-30 MED ORDER — GUAIFENESIN ER 600 MG PO TB12
1200.0000 mg | ORAL_TABLET | Freq: Two times a day (BID) | ORAL | Status: DC
  Administered 2019-06-30 – 2019-07-10 (×21): 1200 mg via ORAL
  Filled 2019-06-30 (×21): qty 2

## 2019-06-30 MED ORDER — INSULIN DETEMIR 100 UNIT/ML ~~LOC~~ SOLN
20.0000 [IU] | Freq: Every day | SUBCUTANEOUS | Status: DC
  Administered 2019-06-30: 20 [IU] via SUBCUTANEOUS
  Filled 2019-06-30 (×3): qty 0.2

## 2019-06-30 NOTE — Progress Notes (Signed)
[place] for heparin infusion Indication: pulmonary embolus  Allergies  Allergen Reactions  . Adhesive [Tape] Other (See Comments)    Blisters skin, peels off. Please use paper tape.   . Codeine Hives, Itching and Swelling  . Latex Hives    Patient Measurements: Height: 6\' 1"  (185.4 cm) Weight: 225 lb 5 oz (102.2 kg) IBW/kg (Calculated) : 79.9  HEPARIN DW (KG): 100.4  Vital Signs: BP: 107/67 (10/17 0400) Pulse Rate: 84 (10/17 0700)  Labs: Recent Labs    06/28/19 0529  06/28/19 2036 06/29/19 0432 06/30/19 0458 06/30/19 0459  HGB 15.0  --   --  14.0  --  13.4  HCT 47.3  --   --  45.7  --  43.6  PLT 320  --   --  272  --  279  HEPARINUNFRC 0.41   < > 0.45 0.48 0.54  --   CREATININE 0.84  --   --  0.69  --  0.58*   < > = values in this interval not displayed.    Estimated Creatinine Clearance: 123.3 mL/min (A) (by C-G formula based on SCr of 0.58 mg/dL (L)).    Assessment: Pharmacy consulted to dose heparin infusion for this 58 yo male with PE. CT shows extensive b/l PE with R heart strain.   HLis therapeutic at 0.54.    Goal of Therapy:  Heparin level 0.3-0.7 units/ml Monitor platelets by anticoagulation protocol: Yes   Plan:  Continue heparin infusion to 2500 units/hr Heparin level  daily  Monitor CBC and s/s of bleeding   Isac Sarna, BS [place], [place] Clinical Pharmacist Pager [phone] 06/30/2019 8:38 AM

## 2019-06-30 NOTE — Progress Notes (Signed)
PROGRESS NOTE    Craig Owens  LPF:[id number] DOB: [birth year] DOA: 06/26/2019 PCP: Lucia Gaskins, MD    Brief Narrative:  58 year old male with history of COPD, tobacco use, previous stroke, presents to the hospital with complaints of shortness of breath and chest discomfort.  Imaging in the emergency room showed evidence of pulmonary nodules and possible cavitary lesion.  He was also found to have massive pulmonary embolus with evidence of right heart strain.  Patient was admitted for further treatments with anticoagulation, management of COPD and possible pneumonia.   Assessment & Plan:   Principal Problem:   Acute pulmonary embolus (HCC) Active Problems:   Acute respiratory failure with hypoxia (HCC)   COPD with acute exacerbation ([place])   Uncontrolled type 2 diabetes mellitus with hyperglycemia ([place])   Palliative care by specialist   Goals of care, counseling/discussion   Anxiety state   1. Acute respiratory failure with hypoxia.  Secondary to pulmonary embolus and COPD.Marland Kitchen  Patient was on nonrebreather this morning, but is now on nasal cannula.  Still requiring a significant amount of supplemental oxygen.  Continue to wean down as tolerated. 2. Acute massive pulmonary embolus with evidence of right heart strain.  Currently on intravenous heparin.  Plan to continue a total of 5 days which will run through 10/18. Will plan on transitioning to oral anticoagulants thereafter.  No prior history of thromboembolism, but with underlying history of malignancy will likely need lifelong therapy. 3. Lung cancer.  Patient was told that he had lung cancer approximately 5 years ago did not pursue further work-up.  He has evidence of pulmonary nodules as well as possible cavitary lesion on discharge imaging.  Plans to follow-up with oncology for further work-up. 4. COPD exacerbation.  Patient continues to smoke.  Still feels short of breath.  Becomes short of breath in conversation and moving  around in bed.  Continue on bronchodilators and intravenous steroids. 5. Possible pneumonia.  Currently on intravenous antibiotics.  Still very short of breath and has productive cough.  Continue current treatments. 6. Demand ischemia.  Patient found to have elevated troponin in the setting of massive PE likely related to right heart strain.  Continue anticoagulation. 7. Type 2 diabetes, uncontrolled with hyperglycemia.  A1c of 8.1.  Since prednisone has been changed to Solu-Medrol, will increase Levemir dosing to 20 units daily.  Continue sliding scale insulin and meal coverage NovoLog. 8. Chronic pain syndrome.  Continue on Percocet 9. Remote history of stroke 5 years ago.  Chronically on aspirin and Plavix which are currently on hold in light of heparin drip.   DVT prophylaxis: heparin infusion Code Status: full code Family Communication: none present Disposition Plan: discharge home once improved. He requires continued inpatient treatment due to persistent/severe shortness of breath and hypoxia   Consultants:     Procedures:     Antimicrobials:   Ceftriaxone  azithromycin    Subjective: He has left lower chest pain that is worse with coughing. He continues to cough. Still has significant shortness of breath  Objective: Vitals:   06/30/19 0600 06/30/19 0700 06/30/19 0745 06/30/19 0800  BP:    91/62  Pulse: 79 84  92  Resp: (!) 24 (!) 28  (!) 33  Temp:      TempSrc:      SpO2: 100% (!) 87% 98% 95%  Weight:      Height:        Intake/Output Summary (Last 24 hours) at 06/30/2019 0908 Last data filed  at 06/30/2019 0742 Gross per 24 hour  Intake 2126.95 ml  Output 3750 ml  Net -1623.05 ml   Filed Weights   06/26/19 1815 06/27/19 0500 06/28/19 0500  Weight: 101.6 kg 101.4 kg 102.2 kg    Examination:  General exam: Appears calm and comfortable  Respiratory system: diminished breath sounds bilaterally, increased respiratory effort Cardiovascular system: S1 & S2  heard, RRR. No JVD, murmurs, rubs, gallops or clicks. No pedal edema. Gastrointestinal system: Abdomen is nondistended, soft and nontender. No organomegaly or masses felt. Normal bowel sounds heard. Central nervous system: Alert and oriented. No focal neurological deficits. Extremities: Symmetric 5 x 5 power. Skin: No rashes, lesions or ulcers Psychiatry: Judgement and insight appear normal. Mood & affect appropriate.     Data Reviewed: I have personally reviewed following labs and imaging studies  CBC: Recent Labs  Lab 06/26/19 1000 06/27/19 0422 06/28/19 0529 06/29/19 0432 06/30/19 0459  WBC 29.7* 29.2* 22.9* 18.5* 16.4*  NEUTROABS 25.1*  --   --   --   --   HGB 15.8 16.7 15.0 14.0 13.4  HCT 48.8 54.1* 47.3 45.7 43.6  MCV 85.0 88.1 85.7 87.5 86.9  PLT 344 330 320 272 628   Basic Metabolic Panel: Recent Labs  Lab 06/26/19 1000 06/27/19 0422 06/28/19 0529 06/28/19 1329 06/29/19 0432 06/30/19 0459  NA 136 139 136  --  136 135  K 4.4 4.4 4.4  --  4.1 4.2  CL 98 102 102  --  102 102  CO2 23 20* 23  --  25 25  GLUCOSE 338* 259* 260* 462* 190* 203*  BUN 31* 41* 41*  --  31* 23*  CREATININE 1.09 1.10 0.84  --  0.69 0.58*  CALCIUM 9.5 9.3 8.9  --  8.7* 8.7*   GFR: Estimated Creatinine Clearance: 123.3 mL/min (A) (by C-G formula based on SCr of 0.58 mg/dL (L)). Liver Function Tests: Recent Labs  Lab 06/26/19 1000  AST 20  ALT 20  ALKPHOS 96  BILITOT 0.4  PROT 8.2*  ALBUMIN 3.0*   No results for input(s): LIPASE, AMYLASE in the last 168 hours. No results for input(s): AMMONIA in the last 168 hours. Coagulation Profile: Recent Labs  Lab 06/26/19 1336  INR 1.6*   Cardiac Enzymes: No results for input(s): CKTOTAL, CKMB, CKMBINDEX, TROPONINI in the last 168 hours. BNP (last 3 results) No results for input(s): PROBNP in the last 8760 hours. HbA1C: No results for input(s): HGBA1C in the last 72 hours. CBG: Recent Labs  Lab 06/29/19 0758 06/29/19 1140  06/29/19 1615 06/29/19 2122 06/30/19 0833  GLUCAP 281* 308* 299* 341* 197*   Lipid Profile: No results for input(s): CHOL, HDL, LDLCALC, TRIG, CHOLHDL, LDLDIRECT in the last 72 hours. Thyroid Function Tests: No results for input(s): TSH, T4TOTAL, FREET4, T3FREE, THYROIDAB in the last 72 hours. Anemia Panel: No results for input(s): VITAMINB12, FOLATE, FERRITIN, TIBC, IRON, RETICCTPCT in the last 72 hours. Sepsis Labs: Recent Labs  Lab 06/26/19 1336 06/27/19 0422  PROCALCITON 0.22 0.25    Recent Results (from the past 240 hour(s))  SARS Coronavirus 2 by RT PCR (hospital order, performed in [facility] hospital lab) Nasopharyngeal Nasopharyngeal Swab     Status: None   Collection Time: 06/26/19 11:06 AM   Specimen: Nasopharyngeal Swab  Result Value Ref Range Status   SARS Coronavirus 2 NEGATIVE NEGATIVE Final    Comment: (NOTE) If result is NEGATIVE SARS-CoV-2 target nucleic acids are NOT DETECTED. The SARS-CoV-2 RNA is generally  detectable in upper and lower  respiratory specimens during the acute phase of infection. The lowest  concentration of SARS-CoV-2 viral copies this assay can detect is 250  copies / mL. A negative result does not preclude SARS-CoV-2 infection  and should not be used as the sole basis for treatment or other  patient management decisions.  A negative result may occur with  improper specimen collection / handling, submission of specimen other  than nasopharyngeal swab, presence of viral mutation(s) within the  areas targeted by this assay, and inadequate number of viral copies  (<250 copies / mL). A negative result must be combined with clinical  observations, patient history, and epidemiological information. If result is POSITIVE SARS-CoV-2 target nucleic acids are DETECTED. The SARS-CoV-2 RNA is generally detectable in upper and lower  respiratory specimens dur ing the acute phase of infection.  Positive  results are indicative of active infection  with SARS-CoV-2.  Clinical  correlation with patient history and other diagnostic information is  necessary to determine patient infection status.  Positive results do  not rule out bacterial infection or co-infection with other viruses. If result is PRESUMPTIVE POSTIVE SARS-CoV-2 nucleic acids MAY BE PRESENT.   A presumptive positive result was obtained on the submitted specimen  and confirmed on repeat testing.  While 2019 novel coronavirus  (SARS-CoV-2) nucleic acids may be present in the submitted sample  additional confirmatory testing may be necessary for epidemiological  and / or clinical management purposes  to differentiate between  SARS-CoV-2 and other Sarbecovirus currently known to infect humans.  If clinically indicated additional testing with an alternate test  methodology [phone]) is advised. The SARS-CoV-2 RNA is generally  detectable in upper and lower respiratory sp ecimens during the acute  phase of infection. The expected result is Negative. Fact Sheet for Patients:  StrictlyIdeas.no Fact Sheet for Healthcare Providers: BankingDealers.co.za This test is not yet approved or cleared by the [place] FDA and has been authorized for detection and/or diagnosis of SARS-CoV-2 by FDA under an Emergency Use Authorization (EUA).  This EUA will remain in effect (meaning this test can be used) for the duration of the COVID-19 declaration under Section 564(b)(1) of the Act, 21 U.S.C. section 360bbb-3(b)(1), unless the authorization is terminated or revoked sooner. Performed at [facility], [address], [place], [place] [address]   Blood Culture (routine x 2)     Status: None (Preliminary result)   Collection Time: 06/26/19 11:21 AM   Specimen: BLOOD RIGHT ARM  Result Value Ref Range Status   Specimen Description BLOOD RIGHT ARM  Final   Special Requests   Final    BOTTLES DRAWN AEROBIC AND ANAEROBIC Blood Culture  adequate volume   Culture   Final    NO GROWTH 4 DAYS Performed at [facility], [address], [place], [place] [address]    Report Status PENDING  Incomplete  Blood Culture (routine x 2)     Status: None (Preliminary result)   Collection Time: 06/26/19 11:32 AM   Specimen: BLOOD RIGHT FOREARM  Result Value Ref Range Status   Specimen Description BLOOD RIGHT FOREARM  Final   Special Requests   Final    BOTTLES DRAWN AEROBIC AND ANAEROBIC Blood Culture adequate volume   Culture   Final    NO GROWTH 4 DAYS Performed at [facility], [address], [place], [place] [address]    Report Status PENDING  Incomplete  MRSA PCR Screening     Status: None  Collection Time: 06/26/19  6:15 PM   Specimen: Nasal Mucosa; Nasopharyngeal  Result Value Ref Range Status   MRSA by PCR NEGATIVE NEGATIVE Final    Comment:        The GeneXpert MRSA Assay (FDA approved for NASAL specimens only), is one component of a comprehensive MRSA colonization surveillance program. It is not intended to diagnose MRSA infection nor to guide or monitor treatment for MRSA infections. Performed at [facility], [address] [place], [place] [address]          Radiology Studies: No results found.      Scheduled Meds: . acetylcysteine  2 mL Nebulization TID  . albuterol  8 puff Inhalation Once  . budesonide (PULMICORT) nebulizer solution  0.5 mg Nebulization BID  . Chlorhexidine Gluconate Cloth  6 each Topical Daily  . feeding supplement (GLUCERNA SHAKE)  237 mL Oral TID BM  . guaiFENesin  1,200 mg Oral BID  . insulin aspart  0-20 Units Subcutaneous TID WC  . insulin aspart  0-5 Units Subcutaneous QHS  . insulin aspart  4 Units Subcutaneous TID WC  . insulin detemir  20 Units Subcutaneous Daily  . ipratropium-albuterol  3 mL Nebulization Q6H  . methylPREDNISolone (SOLU-MEDROL) injection  60 mg Intravenous Q12H  . varenicline  0.5 mg Oral BID   Continuous Infusions: . azithromycin 500 mg  (06/29/19 1018)  . cefTRIAXone (ROCEPHIN)  IV 2 g (06/29/19 1204)  . heparin 2,500 Units/hr (06/30/19 0034)     LOS: 4 days    Time spent: 68mins    Kathie Dike, MD [facility]   If 7PM-7AM, please contact night-coverage [email or web address]  06/30/2019, 9:08 AM

## 2019-07-01 LAB — GLUCOSE, CAPILLARY
Glucose-Capillary: 278 mg/dL — ABNORMAL HIGH (ref 70–99)
Glucose-Capillary: 450 mg/dL — ABNORMAL HIGH (ref 70–99)
Glucose-Capillary: 325 mg/dL — ABNORMAL HIGH (ref 70–99)
Glucose-Capillary: 211 mg/dL — ABNORMAL HIGH (ref 70–99)

## 2019-07-01 LAB — BASIC METABOLIC PANEL
Anion gap: 9 (ref 5–15)
BUN: 27 mg/dL — ABNORMAL HIGH (ref 6–20)
CO2: 25 mmol/L (ref 22–32)
Calcium: 9.2 mg/dL (ref 8.9–10.3)
Chloride: 101 mmol/L (ref 98–111)
Creatinine, Ser: 0.58 mg/dL — ABNORMAL LOW (ref 0.61–1.24)
GFR calc Af Amer: >60 mL/min (ref >60)
GFR calc non Af Amer: >60 mL/min (ref >60)
Glucose, Bld: 326 mg/dL — ABNORMAL HIGH (ref 70–99)
Potassium: 4.9 mmol/L (ref 3.5–5.1)
Sodium: 135 mmol/L (ref 135–145)

## 2019-07-01 LAB — HEPARIN LEVEL (UNFRACTIONATED): Heparin Unfractionated: 0.65 IU/mL (ref 0.30–0.70)

## 2019-07-01 MED ORDER — INSULIN DETEMIR 100 UNIT/ML ~~LOC~~ SOLN
25.0000 [IU] | Freq: Every day | SUBCUTANEOUS | Status: DC
  Administered 2019-07-01 – 2019-07-03 (×3): 25 [IU] via SUBCUTANEOUS
  Filled 2019-07-01 (×4): qty 0.25

## 2019-07-01 MED ORDER — INSULIN ASPART 100 UNIT/ML ~~LOC~~ SOLN
8.0000 [IU] | Freq: Three times a day (TID) | SUBCUTANEOUS | Status: DC
  Administered 2019-07-01 (×2): 8 [IU] via SUBCUTANEOUS

## 2019-07-01 MED ORDER — INSULIN ASPART 100 UNIT/ML ~~LOC~~ SOLN
25.0000 [IU] | Freq: Once | SUBCUTANEOUS | Status: AC
  Administered 2019-07-01: 12:00:00 25 [IU] via SUBCUTANEOUS

## 2019-07-01 NOTE — Progress Notes (Signed)
PROGRESS NOTE    Craig Owens  KGY:[id number] DOB: Nov 16, 1960 DOA: 06/26/2019 PCP: Lucia Gaskins, MD    Brief Narrative:  58 year old male with history of COPD, tobacco use, previous stroke, presents to the hospital with complaints of shortness of breath and chest discomfort.  Imaging in the emergency room showed evidence of pulmonary nodules and possible cavitary lesion.  He was also found to have massive pulmonary embolus with evidence of right heart strain.  Patient was admitted for further treatments with anticoagulation, management of COPD and possible pneumonia.   Assessment & Plan:   Principal Problem:   Acute pulmonary embolus (HCC) Active Problems:   Tobacco abuse   Acute respiratory failure with hypoxia (HCC)   COPD with acute exacerbation ([place])   Uncontrolled type 2 diabetes mellitus with hyperglycemia ([place])   Palliative care by specialist   Goals of care, counseling/discussion   Anxiety state   Lung cancer ([place])   PNA (pneumonia)   Chronic pain syndrome   Demand ischemia ([place])   1. Acute respiratory failure with hypoxia.  Secondary to pulmonary embolus and COPD.Marland Kitchen  Patient was on nonrebreather this morning, but is now on nasal cannula.  Still requiring a significant amount of supplemental oxygen.  Continue to wean down as tolerated. Will ask staff to try new oxygen sensor since current sensor may not be reliable 2. Acute massive pulmonary embolus with evidence of right heart strain.  Currently on intravenous heparin.  Plan to continue a total of 5 days which will run through 10/18. Will plan on transitioning to oral anticoagulants thereafter.  No prior history of thromboembolism, but with underlying history of malignancy will likely need lifelong therapy. 3. Lung cancer.  Patient was told that he had lung cancer approximately 5 years ago did not pursue further work-up.  He has evidence of pulmonary nodules as well as possible cavitary lesion on discharge imaging.   Plans to follow-up with oncology for further work-up. 4. COPD exacerbation.  Patient continues to smoke.  Still feels short of breath.  Becomes short of breath in conversation and moving around in bed.  Continue on bronchodilators and intravenous steroids. 5. Possible pneumonia.  Currently on intravenous antibiotics.  Still very short of breath and has productive cough.  Continue current treatments. 6. Demand ischemia.  Patient found to have elevated troponin in the setting of massive PE likely related to right heart strain.  Continue anticoagulation. 7. Type 2 diabetes, uncontrolled with hyperglycemia.  A1c of 8.1.  Blood sugars are elevated in the setting of steroids.  Will increase Levemir to 25 units daily.  Continue sliding scale insulin and meal coverage NovoLog. 8. Chronic pain syndrome.  Continue on Percocet 9. Remote history of stroke 5 years ago.  Chronically on aspirin and Plavix which are currently on hold in light of heparin drip.   DVT prophylaxis: heparin infusion Code Status: full code Family Communication: none present Disposition Plan: discharge home once improved. He requires continued inpatient treatment due to persistent/severe shortness of breath and hypoxia   Consultants:     Procedures:     Antimicrobials:   Ceftriaxone  azithromycin    Subjective: Continues to complain of left lower chest pain which is worse with coughing and deep breathing.  Continues to require nonrebreather mask.  Objective: Vitals:   07/01/19 0800 07/01/19 0805 07/01/19 0813 07/01/19 0823  BP: 111/70     Pulse: 66     Resp: (!) 22     Temp:  TempSrc:  Axillary    SpO2: 98%  99% 93%  Weight:      Height:        Intake/Output Summary (Last 24 hours) at 07/01/2019 0919 Last data filed at 07/01/2019 0310 Gross per 24 hour  Intake 825.84 ml  Output 2050 ml  Net -1224.16 ml   Filed Weights   06/26/19 1815 06/27/19 0500 06/28/19 0500  Weight: 101.6 kg 101.4 kg 102.2 kg     Examination:  General exam: Alert, awake, oriented x 3 Respiratory system: Clear to auscultation. Respiratory effort normal. Cardiovascular system:RRR. No murmurs, rubs, gallops. Gastrointestinal system: Abdomen is nondistended, soft and nontender. No organomegaly or masses felt. Normal bowel sounds heard. Central nervous system: Alert and oriented. No focal neurological deficits. Extremities: No C/C/E, +pedal pulses Skin: No rashes, lesions or ulcers Psychiatry: Judgement and insight appear normal. Mood & affect appropriate.      Data Reviewed: I have personally reviewed following labs and imaging studies  CBC: Recent Labs  Lab 06/26/19 1000 06/27/19 0422 06/28/19 0529 06/29/19 0432 06/30/19 0459  WBC 29.7* 29.2* 22.9* 18.5* 16.4*  NEUTROABS 25.1*  --   --   --   --   HGB 15.8 16.7 15.0 14.0 13.4  HCT 48.8 54.1* 47.3 45.7 43.6  MCV 85.0 88.1 85.7 87.5 86.9  PLT 344 330 320 272 161   Basic Metabolic Panel: Recent Labs  Lab 06/27/19 0422 06/28/19 0529 06/28/19 1329 06/29/19 0432 06/30/19 0459 07/01/19 0437  NA 139 136  --  136 135 135  K 4.4 4.4  --  4.1 4.2 4.9  CL 102 102  --  102 102 101  CO2 20* 23  --  25 25 25   GLUCOSE 259* 260* 462* 190* 203* 326*  BUN 41* 41*  --  31* 23* 27*  CREATININE 1.10 0.84  --  0.69 0.58* 0.58*  CALCIUM 9.3 8.9  --  8.7* 8.7* 9.2   GFR: Estimated Creatinine Clearance: 123.3 mL/min (A) (by C-G formula based on SCr of 0.58 mg/dL (L)). Liver Function Tests: Recent Labs  Lab 06/26/19 1000  AST 20  ALT 20  ALKPHOS 96  BILITOT 0.4  PROT 8.2*  ALBUMIN 3.0*   No results for input(s): LIPASE, AMYLASE in the last 168 hours. No results for input(s): AMMONIA in the last 168 hours. Coagulation Profile: Recent Labs  Lab 06/26/19 1336  INR 1.6*   Cardiac Enzymes: No results for input(s): CKTOTAL, CKMB, CKMBINDEX, TROPONINI in the last 168 hours. BNP (last 3 results) No results for input(s): PROBNP in the last 8760 hours.  HbA1C: No results for input(s): HGBA1C in the last 72 hours. CBG: Recent Labs  Lab 06/30/19 0833 06/30/19 1102 06/30/19 1634 06/30/19 2030 07/01/19 0808  GLUCAP 197* 376* 335* 376* 278*   Lipid Profile: No results for input(s): CHOL, HDL, LDLCALC, TRIG, CHOLHDL, LDLDIRECT in the last 72 hours. Thyroid Function Tests: No results for input(s): TSH, T4TOTAL, FREET4, T3FREE, THYROIDAB in the last 72 hours. Anemia Panel: No results for input(s): VITAMINB12, FOLATE, FERRITIN, TIBC, IRON, RETICCTPCT in the last 72 hours. Sepsis Labs: Recent Labs  Lab 06/26/19 1336 06/27/19 0422  PROCALCITON 0.22 0.25    Recent Results (from the past 240 hour(s))  SARS Coronavirus 2 by RT PCR (hospital order, performed in [facility] hospital lab) Nasopharyngeal Nasopharyngeal Swab     Status: None   Collection Time: 06/26/19 11:06 AM   Specimen: Nasopharyngeal Swab  Result Value Ref Range Status   SARS Coronavirus  2 NEGATIVE NEGATIVE Final    Comment: (NOTE) If result is NEGATIVE SARS-CoV-2 target nucleic acids are NOT DETECTED. The SARS-CoV-2 RNA is generally detectable in upper and lower  respiratory specimens during the acute phase of infection. The lowest  concentration of SARS-CoV-2 viral copies this assay can detect is 250  copies / mL. A negative result does not preclude SARS-CoV-2 infection  and should not be used as the sole basis for treatment or other  patient management decisions.  A negative result may occur with  improper specimen collection / handling, submission of specimen other  than nasopharyngeal swab, presence of viral mutation(s) within the  areas targeted by this assay, and inadequate number of viral copies  (<250 copies / mL). A negative result must be combined with clinical  observations, patient history, and epidemiological information. If result is POSITIVE SARS-CoV-2 target nucleic acids are DETECTED. The SARS-CoV-2 RNA is generally detectable in upper and lower   respiratory specimens dur ing the acute phase of infection.  Positive  results are indicative of active infection with SARS-CoV-2.  Clinical  correlation with patient history and other diagnostic information is  necessary to determine patient infection status.  Positive results do  not rule out bacterial infection or co-infection with other viruses. If result is PRESUMPTIVE POSTIVE SARS-CoV-2 nucleic acids MAY BE PRESENT.   A presumptive positive result was obtained on the submitted specimen  and confirmed on repeat testing.  While 2019 novel coronavirus  (SARS-CoV-2) nucleic acids may be present in the submitted sample  additional confirmatory testing may be necessary for epidemiological  and / or clinical management purposes  to differentiate between  SARS-CoV-2 and other Sarbecovirus currently known to infect humans.  If clinically indicated additional testing with an alternate test  methodology [phone]) is advised. The SARS-CoV-2 RNA is generally  detectable in upper and lower respiratory sp ecimens during the acute  phase of infection. The expected result is Negative. Fact Sheet for Patients:  StrictlyIdeas.no Fact Sheet for Healthcare Providers: BankingDealers.co.za This test is not yet approved or cleared by the [place] FDA and has been authorized for detection and/or diagnosis of SARS-CoV-2 by FDA under an Emergency Use Authorization (EUA).  This EUA will remain in effect (meaning this test can be used) for the duration of the COVID-19 declaration under Section 564(b)(1) of the Act, 21 U.S.C. section 360bbb-3(b)(1), unless the authorization is terminated or revoked sooner. Performed at [facility], [address] [place], [place] [address]   Blood Culture (routine x 2)     Status: None (Preliminary result)   Collection Time: 06/26/19 11:21 AM   Specimen: BLOOD RIGHT ARM  Result Value Ref Range Status   Specimen  Description BLOOD RIGHT ARM  Final   Special Requests   Final    BOTTLES DRAWN AEROBIC AND ANAEROBIC Blood Culture adequate volume   Culture   Final    NO GROWTH 4 DAYS Performed at [facility], [address], [place], [place] [address]    Report Status PENDING  Incomplete  Blood Culture (routine x 2)     Status: None (Preliminary result)   Collection Time: 06/26/19 11:32 AM   Specimen: BLOOD RIGHT FOREARM  Result Value Ref Range Status   Specimen Description BLOOD RIGHT FOREARM  Final   Special Requests   Final    BOTTLES DRAWN AEROBIC AND ANAEROBIC Blood Culture adequate volume   Culture   Final    NO GROWTH 4 DAYS Performed at [facility],  [address] [place], [place] [address]    Report Status PENDING  Incomplete  MRSA PCR Screening     Status: None   Collection Time: 06/26/19  6:15 PM   Specimen: Nasal Mucosa; Nasopharyngeal  Result Value Ref Range Status   MRSA by PCR NEGATIVE NEGATIVE Final    Comment:        The GeneXpert MRSA Assay (FDA approved for NASAL specimens only), is one component of a comprehensive MRSA colonization surveillance program. It is not intended to diagnose MRSA infection nor to guide or monitor treatment for MRSA infections. Performed at [facility], [address], [place], [place] [address]          Radiology Studies: No results found.      Scheduled Meds: . acetylcysteine  2 mL Nebulization TID  . albuterol  8 puff Inhalation Once  . budesonide (PULMICORT) nebulizer solution  0.5 mg Nebulization BID  . Chlorhexidine Gluconate Cloth  6 each Topical Daily  . feeding supplement (GLUCERNA SHAKE)  237 mL Oral TID BM  . guaiFENesin  1,200 mg Oral BID  . insulin aspart  0-20 Units Subcutaneous TID WC  . insulin aspart  0-5 Units Subcutaneous QHS  . insulin aspart  4 Units Subcutaneous TID WC  . insulin detemir  20 Units Subcutaneous Daily  . ipratropium-albuterol  3 mL Nebulization Q6H  . methylPREDNISolone (SOLU-MEDROL)  injection  60 mg Intravenous Q12H  . varenicline  0.5 mg Oral BID   Continuous Infusions: . azithromycin Stopped (06/30/19 1223)  . cefTRIAXone (ROCEPHIN)  IV Stopped (06/30/19 1358)  . heparin 2,500 Units/hr (07/01/19 0819)     LOS: 5 days    Time spent: 42mins    Kathie Dike, MD [facility]   If 7PM-7AM, please contact night-coverage [email or web address]  07/01/2019, 9:19 AM

## 2019-07-01 NOTE — Progress Notes (Signed)
[place] for heparin infusion Indication: pulmonary embolus  Allergies  Allergen Reactions  . Adhesive [Tape] Other (See Comments)    Blisters skin, peels off. Please use paper tape.   . Codeine Hives, Itching and Swelling  . Latex Hives    Patient Measurements: Height: 6\' 1"  (185.4 cm) Weight: 225 lb 5 oz (102.2 kg) IBW/kg (Calculated) : 79.9  HEPARIN DW (KG): 100.4  Vital Signs: Temp: 97.8 F (36.6 C) (10/18 0500) Temp Source: Axillary (10/18 0805) BP: 111/70 (10/18 0800) Pulse Rate: 66 (10/18 0800)  Labs: Recent Labs    06/29/19 0432 06/30/19 0458 06/30/19 0459 07/01/19 0437  HGB 14.0  --  13.4  --   HCT 45.7  --  43.6  --   PLT 272  --  279  --   HEPARINUNFRC 0.48 0.54  --  0.65  CREATININE 0.69  --  0.58* 0.58*    Estimated Creatinine Clearance: 123.3 mL/min (A) (by C-G formula based on SCr of 0.58 mg/dL (L)).    Assessment: Pharmacy consulted to dose heparin infusion for this 58 yo male with PE. CT shows extensive b/l PE with R heart strain.   HLis therapeutic at 0.65.    Goal of Therapy:  Heparin level 0.3-0.7 units/ml Monitor platelets by anticoagulation protocol: Yes   Plan:  Continue heparin infusion to 2500 units/hr Heparin level  daily  Monitor CBC and s/s of bleeding  F/U transition to po tx  Isac Sarna, BS [place], [place] Clinical Pharmacist Pager [phone] 07/01/2019 8:21 AM

## 2019-07-02 ENCOUNTER — Inpatient Hospital Stay (HOSPITAL_COMMUNITY): Payer: Medicare HMO

## 2019-07-02 ENCOUNTER — Encounter (HOSPITAL_COMMUNITY): Payer: Self-pay | Admitting: Radiology

## 2019-07-02 LAB — CULTURE, BLOOD (ROUTINE X 2)
Culture: NO GROWTH
Culture: NO GROWTH
Special Requests: ADEQUATE
Special Requests: ADEQUATE

## 2019-07-02 LAB — GLUCOSE, CAPILLARY
Glucose-Capillary: 310 mg/dL — ABNORMAL HIGH (ref 70–99)
Glucose-Capillary: 422 mg/dL — ABNORMAL HIGH (ref 70–99)
Glucose-Capillary: 263 mg/dL — ABNORMAL HIGH (ref 70–99)
Glucose-Capillary: 304 mg/dL — ABNORMAL HIGH (ref 70–99)

## 2019-07-02 LAB — COMPREHENSIVE METABOLIC PANEL
ALT: 11 U/L (ref 0–44)
AST: 12 U/L — ABNORMAL LOW (ref 15–41)
Albumin: 2.3 g/dL — ABNORMAL LOW (ref 3.5–5.0)
Alkaline Phosphatase: 61 U/L (ref 38–126)
Anion gap: 10 (ref 5–15)
BUN: 29 mg/dL — ABNORMAL HIGH (ref 6–20)
CO2: 24 mmol/L (ref 22–32)
Calcium: 9.1 mg/dL (ref 8.9–10.3)
Chloride: 99 mmol/L (ref 98–111)
Creatinine, Ser: 0.71 mg/dL (ref 0.61–1.24)
GFR calc Af Amer: >60 mL/min (ref >60)
GFR calc non Af Amer: >60 mL/min (ref >60)
Glucose, Bld: 349 mg/dL — ABNORMAL HIGH (ref 70–99)
Potassium: 4.9 mmol/L (ref 3.5–5.1)
Sodium: 133 mmol/L — ABNORMAL LOW (ref 135–145)
Total Bilirubin: 0.3 mg/dL (ref 0.3–1.2)
Total Protein: 6.3 g/dL — ABNORMAL LOW (ref 6.5–8.1)

## 2019-07-02 LAB — CBC
HCT: 43 % (ref 39.0–52.0)
Hemoglobin: 13.1 g/dL (ref 13.0–17.0)
MCH: 26.3 pg (ref 26.0–34.0)
MCHC: 30.5 g/dL (ref 30.0–36.0)
MCV: 86.3 fL (ref 80.0–100.0)
Platelets: 308 10*3/uL (ref 150–400)
RBC: 4.98 MIL/uL (ref 4.22–5.81)
RDW: 14.1 % (ref 11.5–15.5)
WBC: 22.4 10*3/uL — ABNORMAL HIGH (ref 4.0–10.5)
nRBC: 0 % (ref 0.0–0.2)

## 2019-07-02 LAB — BLOOD GAS, ARTERIAL
Acid-Base Excess: 2.7 mmol/L — ABNORMAL HIGH (ref 0.0–2.0)
Bicarbonate: 26.8 mmol/L (ref 20.0–28.0)
Drawn by: 317771
FIO2: 36
O2 Saturation: 80.8 %
Patient temperature: 37
pCO2 arterial: 35.7 mmHg (ref 32.0–48.0)
pH, Arterial: 7.477 — ABNORMAL HIGH (ref 7.350–7.450)
pO2, Arterial: 46.4 mmHg — ABNORMAL LOW (ref 83.0–108.0)

## 2019-07-02 LAB — HEPARIN LEVEL (UNFRACTIONATED): Heparin Unfractionated: 0.75 IU/mL — ABNORMAL HIGH (ref 0.30–0.70)

## 2019-07-02 MED ORDER — IPRATROPIUM BROMIDE 0.02 % IN SOLN
0.5000 mg | Freq: Four times a day (QID) | RESPIRATORY_TRACT | Status: DC
  Administered 2019-07-03 – 2019-07-05 (×10): 0.5 mg via RESPIRATORY_TRACT
  Filled 2019-07-02 (×10): qty 2.5

## 2019-07-02 MED ORDER — APIXABAN 5 MG PO TABS
10.0000 mg | ORAL_TABLET | Freq: Two times a day (BID) | ORAL | Status: AC
  Administered 2019-07-02 – 2019-07-08 (×14): 10 mg via ORAL
  Filled 2019-07-02 (×14): qty 2

## 2019-07-02 MED ORDER — APIXABAN 5 MG PO TABS
5.0000 mg | ORAL_TABLET | Freq: Two times a day (BID) | ORAL | Status: DC
  Administered 2019-07-09 – 2019-07-10 (×3): 5 mg via ORAL
  Filled 2019-07-02 (×3): qty 1

## 2019-07-02 MED ORDER — METHYLPREDNISOLONE SODIUM SUCC 40 MG IJ SOLR
40.0000 mg | Freq: Two times a day (BID) | INTRAMUSCULAR | Status: DC
  Administered 2019-07-02 – 2019-07-05 (×8): 40 mg via INTRAVENOUS
  Filled 2019-07-02 (×8): qty 1

## 2019-07-02 MED ORDER — LEVALBUTEROL HCL 0.63 MG/3ML IN NEBU
0.6300 mg | INHALATION_SOLUTION | Freq: Four times a day (QID) | RESPIRATORY_TRACT | Status: DC | PRN
  Administered 2019-07-03 – 2019-07-04 (×5): 0.63 mg via RESPIRATORY_TRACT
  Filled 2019-07-02 (×6): qty 3

## 2019-07-02 MED ORDER — SODIUM CHLORIDE 0.9 % IV SOLN
500.0000 mg | INTRAVENOUS | Status: AC
  Administered 2019-07-02: 11:00:00 500 mg via INTRAVENOUS
  Filled 2019-07-02: qty 500

## 2019-07-02 MED ORDER — INSULIN ASPART 100 UNIT/ML ~~LOC~~ SOLN
10.0000 [IU] | Freq: Three times a day (TID) | SUBCUTANEOUS | Status: DC
  Administered 2019-07-02 – 2019-07-05 (×12): 10 [IU] via SUBCUTANEOUS

## 2019-07-02 MED ORDER — MORPHINE SULFATE (PF) 2 MG/ML IV SOLN
2.0000 mg | Freq: Once | INTRAVENOUS | Status: AC
  Administered 2019-07-02: 21:00:00 2 mg via INTRAVENOUS
  Filled 2019-07-02: qty 1

## 2019-07-02 MED ORDER — HEPARIN (PORCINE) 25000 UT/250ML-% IV SOLN
2300.0000 [IU]/h | INTRAVENOUS | Status: DC
  Administered 2019-07-02: 2300 [IU]/h via INTRAVENOUS
  Filled 2019-07-02: qty 250

## 2019-07-02 MED ORDER — SODIUM CHLORIDE 0.9 % IV SOLN
2.0000 g | INTRAVENOUS | Status: AC
  Administered 2019-07-02: 10:00:00 2 g via INTRAVENOUS
  Filled 2019-07-02: qty 20

## 2019-07-02 NOTE — Progress Notes (Signed)
[place] for heparin infusion Indication: pulmonary embolus  Allergies  Allergen Reactions  . Adhesive [Tape] Other (See Comments)    Blisters skin, peels off. Please use paper tape.   . Codeine Hives, Itching and Swelling  . Latex Hives    Patient Measurements: Height: 6\' 1"  (185.4 cm) Weight: 225 lb 5 oz (102.2 kg) IBW/kg (Calculated) : 79.9  HEPARIN DW (KG): 100.4  Vital Signs: BP: 120/70 (10/19 0500) Pulse Rate: 81 (10/19 0500)  Labs: Recent Labs    06/30/19 0458 06/30/19 0459 07/01/19 0437 07/02/19 0353  HGB  --  13.4  --  13.1  HCT  --  43.6  --  43.0  PLT  --  279  --  308  HEPARINUNFRC 0.54  --  0.65 0.75*  CREATININE  --  0.58* 0.58* 0.71    Estimated Creatinine Clearance: 123.3 mL/min (by C-G formula based on SCr of 0.71 mg/dL).    Assessment: Pharmacy consulted to dose heparin infusion for this 58 yo male with PE. CT shows extensive b/l PE with R heart strain.   Today, 07/02/19   HL 0.75, supratherapeutic  Hgb 13.1 WNL stable, plt 308 WNL stable  No line or bleeding issues per RN    Goal of Therapy:  Heparin level 0.3-0.7 units/ml Monitor platelets by anticoagulation protocol: Yes   Plan:   Decrease heparin infusion to 2300 units/hr  HL 6 hours after rate change   Heparin level  daily   Monitor CBC and s/s of bleeding   F/U transition to po tx   Royetta Asal, PharmD, BCPS 07/02/2019 5:23 AM

## 2019-07-02 NOTE — Evaluation (Addendum)
Physical Therapy Evaluation Patient Details Name: Craig Owens MRN: [id number] DOB: 1961-04-17 Today's Date: 07/02/2019   History of Present Illness  Craig Owens is a 58 y.o. male with medical history of COPD, tobacco abuse, nonobstructive CAD, THC use, remote stroke presenting with acute onset of shortness of breath and chest discomfort approximately 2 hours prior to admission.  The patient denies any fevers, chills, headache, neck pain, nausea, vomiting, diarrhea, abdominal pain.  He has had a small amount of hemoptysis for 1 day.  He denies any hematemesis, hematochezia, melena.Notably, the patient states that he was told that he had "lung cancer" approximately 5 years ago by his primary care provider, but the patient was lost to follow-up and never followed through with work-up.Nevertheless, the patient continued to smoke approximately 2 packs/day for the last 40 years.  He uses THC but denies any other illegal drug use.  Upon EMS arrival, the patient was noted to be hypoxic with oxygen saturation of 80% on 6 L.  The patient was subsequently placed on nonrebreather in the emergency department with oxygen saturations 96-98%.In the emergency department, the patient was afebrile, tachycardic with HR 110-120, and hypoxic requiring a nonrebreather with oxygen saturation 98%.  CT angiogram chest showed pulmonary embolus in the bilateral distal main pulmonary artery with extension into the upper lobes and lower lobes bilateral.  There was RV strain.    Clinical Impression  Patient demonstrates slow labored movement for sitting up at bedside, had to use his cane to pull self up with head of bed raised, but states he does that at home in his hospital bed, had no loss of balance when ambulating with SPC, but limited mostly due to difficulty breath/SOB with SpO2 dropping from 93% to 84% with exertion while on room air.  Patient tolerated sitting up in chair after therapy.  Patient will benefit from continued  physical therapy in hospital and recommended venue below to increase strength, balance, endurance for safe ADLs and gait.    Follow Up Recommendations Home health PT;Supervision - Intermittent    Equipment Recommendations  None recommended by PT    Recommendations for Other Services       Precautions / Restrictions Precautions Precautions: Fall Restrictions Weight Bearing Restrictions: No      Mobility  Bed Mobility Overal bed mobility: Modified Independent             General bed mobility comments: increased time, labored movement, head of bed raised and had to use cane to pull self up  Transfers Overall transfer level: Needs assistance Equipment used: Straight cane Transfers: Sit to/from Stand;Stand Pivot Transfers Sit to Stand: Supervision Stand pivot transfers: Supervision       General transfer comment: increased time, labored movement  Ambulation/Gait Ambulation/Gait assistance: Min guard Gait Distance (Feet): 30 Feet Assistive device: Straight cane Gait Pattern/deviations: Decreased step length - right;Decreased step length - left;Decreased stride length Gait velocity: decreased   General Gait Details: slow labored cadence without loss of balance, limited mostly due to difficulty breathing  Stairs            Wheelchair Mobility    Modified Rankin (Stroke Patients Only)       Balance Overall balance assessment: Needs assistance Sitting-balance support: Feet supported;No upper extremity supported Sitting balance-Leahy Scale: Good Sitting balance - Comments: seated at EOB   Standing balance support: During functional activity;Single extremity supported Standing balance-Leahy Scale: Fair Standing balance comment: using SPC  Pertinent Vitals/Pain Pain Assessment: Faces Faces Pain Scale: Hurts little more Pain Location: left side lower back Pain Descriptors / Indicators: Sore Pain Intervention(s):  Limited activity within patient's tolerance;Monitored during session    Home Living Family/patient expects to be discharged to:: Private residence Living Arrangements: Alone Available Help at Discharge: Friend(s);Available PRN/intermittently;Other (Comment)(no reliable help per patient) Type of Home: House Home Access: Stairs to enter Entrance Stairs-Rails: Right Entrance Stairs-Number of Steps: 4-5 Home Layout: One level Home Equipment: Cane - single point;Hospital bed      Prior Function Level of Independence: Independent with assistive device(s)   Gait / Transfers Assistance Needed: Community ambulator with [facility], drives           Hand Dominance        Extremity/Trunk Assessment   Upper Extremity Assessment Upper Extremity Assessment: Overall WFL for tasks assessed    Lower Extremity Assessment Lower Extremity Assessment: Generalized weakness    Cervical / Trunk Assessment Cervical / Trunk Assessment: Normal  Communication   Communication: No difficulties  Cognition Arousal/Alertness: Awake/alert Behavior During Therapy: WFL for tasks assessed/performed Overall Cognitive Status: Within Functional Limits for tasks assessed                                        General Comments      Exercises     Assessment/Plan    PT Assessment Patient needs continued PT services  PT Problem List Decreased strength;Decreased activity tolerance;Decreased balance;Decreased mobility       PT Treatment Interventions Balance training;Gait training;Stair training;Functional mobility training;Therapeutic activities;Therapeutic exercise;Patient/family education    PT Goals (Current goals can be found in the Care Plan section)  Acute Rehab PT Goals Patient Stated Goal: return home PT Goal Formulation: With patient Time For Goal Achievement: 07/09/19 Potential to Achieve Goals: Good    Frequency Min 3X/week   Barriers to discharge        Co-evaluation                AM-PAC PT "6 Clicks" Mobility  Outcome Measure Help needed turning from your back to your side while in a flat bed without using bedrails?: None Help needed moving from lying on your back to sitting on the side of a flat bed without using bedrails?: None Help needed moving to and from a bed to a chair (including a wheelchair)?: A Little Help needed standing up from a chair using your arms (e.g., wheelchair or bedside chair)?: None Help needed to walk in hospital room?: A Little Help needed climbing 3-5 steps with a railing? : A Lot 6 Click Score: 20    End of Session   Activity Tolerance: Patient tolerated treatment well;Patient limited by fatigue Patient left: in chair;with call bell/phone within reach Nurse Communication: Mobility status PT Visit Diagnosis: Unsteadiness on feet (R26.81);Other abnormalities of gait and mobility (R26.89)    Time: 5643-3295 PT Time Calculation (min) (ACUTE ONLY): 32 min   Charges:   PT Evaluation $PT Eval Moderate Complexity: 1 Mod PT Treatments $Therapeutic Activity: 23-37 mins        12:07 PM, 07/02/19 Lonell Grandchild, MPT Physical Therapist with [facility] [phone] office [phone] mobile phone

## 2019-07-02 NOTE — Care Management Important Message (Signed)
Important Message  Patient Details  Name: Craig Owens MRN: [id number] Date of Birth: Sep 09, 1961   Medicare Important Message Given:  Yes     [name] 07/02/2019, 2:00 PM

## 2019-07-02 NOTE — Progress Notes (Signed)
[place] for heparin infusion--> eliquis Indication: pulmonary embolus  Allergies  Allergen Reactions  . Adhesive [Tape] Other (See Comments)    Blisters skin, peels off. Please use paper tape.   . Codeine Hives, Itching and Swelling  . Latex Hives    Patient Measurements: Height: 6\' 1"  (185.4 cm) Weight: 227 lb 15.3 oz (103.4 kg) IBW/kg (Calculated) : 79.9  HEPARIN DW (KG): 100.4  Vital Signs: Temp: 97.9 F (36.6 C) (10/19 0400) Temp Source: Oral (10/19 0400) BP: 134/68 (10/19 0604) Pulse Rate: 79 (10/19 0604)  Labs: Recent Labs    06/30/19 0458 06/30/19 0459 07/01/19 0437 07/02/19 0353  HGB  --  13.4  --  13.1  HCT  --  43.6  --  43.0  PLT  --  279  --  308  HEPARINUNFRC 0.54  --  0.65 0.75*  CREATININE  --  0.58* 0.58* 0.71    Estimated Creatinine Clearance: 124 mL/min (by C-G formula based on SCr of 0.71 mg/dL).    Assessment: Pharmacy consulted to dose heparin infusion for this 58 yo male with PE. CT shows extensive b/l PE with R heart strain. Plan is to transition to oral therapy with eliquis.  Goal of Therapy:  Heparin level 0.3-0.7 units/ml Monitor platelets by anticoagulation protocol: Yes   Plan:   Eliquis 10mg  po bid x 7 days, then 5mg  po bid  Monitor CBC and s/s of bleeding   Educate on eliquis  Isac Sarna, BS Pharm D, [place] Clinical Pharmacist Pager [phone]  07/02/2019 7:28 AM

## 2019-07-02 NOTE — Clinical Social Work Note (Signed)
TOC attempted to assess patient. Patient very short of breath. TOC will follow and request when breathing has improved.     Dillard Pascal, Clydene Pugh, LCSW

## 2019-07-02 NOTE — Progress Notes (Signed)
Pt has been placed on BiPAP 3xs since I started my shift. RT or RN puts it on. He takes it off, screaming "I cant breathe". He then demands his NRB be put back on. He wears this for awhile then wants the BiPAP back on. All the while continuously screaming that he cannot breathe. Pt was educated by multiple RNs, RTs, and other staff about his needing to allow the mask to do its job, and that the NRB is not the best option for him seeing as he has COPD. Pt refuses this education. MD and midlevel both notified about continued air hunger and his wanting to be on the NRB mask. 2mg  morphine was all that has been ordered at this time. MD said to call back if this was not effective. Pt remains on NRB.

## 2019-07-02 NOTE — Plan of Care (Signed)
  Problem: Acute Rehab PT Goals(only PT should resolve) Goal: Pt Will Go Supine/Side To Sit Outcome: Progressing Flowsheets (Taken 07/02/2019 1212) Pt will go Supine/Side to Sit: with modified independence Note: With head of bed lowered without using cane Goal: Patient Will Transfer Sit To/From Stand Outcome: Progressing Flowsheets (Taken 07/02/2019 1212) Patient will transfer sit to/from stand: with modified independence Goal: Pt Will Transfer Bed To Chair/Chair To Bed Outcome: Progressing Flowsheets (Taken 07/02/2019 1212) Pt will Transfer Bed to Chair/Chair to Bed: with modified independence Goal: Pt Will Ambulate Outcome: Progressing Flowsheets (Taken 07/02/2019 1212) Pt will Ambulate:  50 feet  with supervision  with cane   12:13 PM, 07/02/19 Lonell Grandchild, MPT Physical Therapist with [facility] [phone] office [phone] mobile phone

## 2019-07-02 NOTE — Progress Notes (Signed)
Nurse notified MD pt has increase work of breathing with RR at 61, pt states having SOB, O2 WNLs on 4L [place]. Orders obtained for CXR, abg, and bipap.

## 2019-07-02 NOTE — Progress Notes (Signed)
PROGRESS NOTE    Craig Owens  YIR:[id number] DOB: November 28, 1960 DOA: 06/26/2019 PCP: Lucia Gaskins, MD   Brief Narrative:  Per HPI: Craig Owens a 58 y.o.malewith medical history ofCOPD, tobacco abuse, nonobstructive CAD, THC use, remote stroke presenting with acute onset of shortness of breath and chest discomfort approximately 2 hours prior to admission. The patient denies any fevers, chills, headache, neck pain, nausea, vomiting, diarrhea, abdominal pain. He has had a small amount of hemoptysis for 1 day. He denies any hematemesis, hematochezia, melena. Notably, the patient states that he was told that he had "lung cancer" approximately 5 years ago by his primary care provider, but the patient was lost to follow-up and never followed through with work-up. Nevertheless, the patient continued to smoke approximately 2 packs/day for the last 40 years. He uses THC but denies any other illegal drug use. Upon EMS arrival, the patient was noted to be hypoxic with oxygen saturation of 80% on 6 L. The patient was subsequently placed on nonrebreather in the emergency department with oxygen saturations 96-98%. In the emergency department, the patient was afebrile, tachycardicwith HR 110-120, and hypoxic requiring a nonrebreather with oxygen saturation 98%. CT angiogram chest showed pulmonary embolus in the bilateral distal main pulmonary artery with extension into the upper lobes and lower lobes bilateral. There was RV strain.  10/14:Patient appears to be doing well this morning with stable vital signs noted. He is actually on room air saturating near 100%. He denies any other symptoms and 2D echocardiogram is pending. We will plan to increase insulin coverage and wean steroids. Palliative consulted for goals of care discussion.  10/15:Patient continues to do well and has some minimal anxiety with hemoptysis this morning. 2D echocardiogram with LVEF 55-60%. Palliative  consultation pending. Plan to transfer to telemetry.  10/16: Patient continues to have excessive sputum output and appears congested.  He is only minimally anxious, but likes to stay on his nonrebreather mask.  No other acute events otherwise noted.  Blood glucose levels are more stable with insulin adjustments yesterday.  We will plan to add chest physiotherapy Mucomyst today.  10/19: Patient has completed course of IV heparin over the course of the weekend and will transition to Eliquis today.  Plan for PT evaluation and weaning of oxygen as well as steroids.  Continue IV antibiotics for 2 more days.  Continue blood glucose control.  Assessment & Plan:   Principal Problem:   Acute pulmonary embolus (HCC) Active Problems:   Tobacco abuse   Acute respiratory failure with hypoxia (HCC)   COPD with acute exacerbation ([place])   Uncontrolled type 2 diabetes mellitus with hyperglycemia ([place])   Palliative care by specialist   Goals of care, counseling/discussion   Anxiety state   Lung cancer ([place])   PNA (pneumonia)   Chronic pain syndrome   Demand ischemia ([place])   Acute hypoxemic respiratory failure secondary to submassive PE -Case discussed with pulmonology on admission who did not feel TPA currently indicated -Discontinue IV heparin today and transition to Eliquis with anticipated lifelong therapy -Continue to monitor closely and likely transfer to telemetrytoday -2D echocardiogramwith LVEF 55-60%, normal RV size, but not well visualized -COVID testing negative  Acute COPD exacerbation with possible pneumonia-improving -Continue on Rocephin and azithromycin with procalcitonin elevation noted, plan to complete 7-day course -Wean IV Solu-Medrol -Breathing treatments to as needed -Continue Pulmicort -Add Mucomyst as well as flutter valve and chest PT for congestion  Pulmonary opacities/nodules -Suspicious for cavitary lung cancer and and  bony metastasis -We will have follow-up  with oncology in the near future, patient would like to remain aggressive with care -Patient was apparently told he had lung cancer 5 years ago but never pursued work-up  Elevated troponin secondary to PE with RV strain -2D echocardiogram with LVEF 55-60% without significant RV issues noted -Does not appear to be clinically significant for ACS  Type 2 diabetes with uncontrolled hyperglycemia -Hemoglobin A1c 8.1% -Levemir increased to 25 units, plan to wean steroids -Continue SSI and carb modified diet and follow carefully  Remote CVA 5 years ago -Hold aspirin and Plavix while on heparin drip -Lipid panel with LDL of 53  THC and tobacco abuse -Urine drug screen with no acute findings -Discussed tobacco cessation and will consider nicotine patch as needed  Chronic pain syndrome -Patient currently on Percocet 5/325 as needed for pain control -Queried on PMP aware   DVT prophylaxis:Heparin drip to Eliquis Code Status:Full Family Communication:None at bedside Disposition Plan:Transition to Eliquis today and continue antibiotics and wean steroids.  Blood glucose control.  PT evaluation.  Remove off telemetry.   Consultants:  Palliative  Procedures:  None  Antimicrobials:  Anti-infectives (From admission, onward)   Start     Dose/Rate Route Frequency Ordered Stop   06/26/19 1115  cefTRIAXone (ROCEPHIN) 2 g in sodium chloride 0.9 % 100 mL IVPB     2 g 200 mL/hr over 30 Minutes Intravenous Every 24 hours 06/26/19 1105     06/26/19 1115  azithromycin (ZITHROMAX) 500 mg in sodium chloride 0.9 % 250 mL IVPB     500 mg 250 mL/hr over 60 Minutes Intravenous Every 24 hours 06/26/19 1105        Subjective: Patient seen and evaluated today with no new acute complaints or concerns.  He still appears to be quite short of breath, but is currently on room air and appears to be saturating in the low 90s percentile.  No acute overnight events noted.  Objective: Vitals:    07/02/19 0300 07/02/19 0400 07/02/19 0500 07/02/19 0604  BP: 121/67 127/73 120/70 134/68  Pulse: 87 87 81 79  Resp: (!) 29 (!) 22 (!) 26 (!) 21  Temp:  97.9 F (36.6 C)    TempSrc:  Oral    SpO2: 97% (!) 89% 99% 96%  Weight:   103.4 kg   Height:        Intake/Output Summary (Last 24 hours) at 07/02/2019 0720 Last data filed at 07/02/2019 0700 Gross per 24 hour  Intake 1463.85 ml  Output 2900 ml  Net -1436.15 ml   Filed Weights   06/27/19 0500 06/28/19 0500 07/02/19 0500  Weight: 101.4 kg 102.2 kg 103.4 kg    Examination:  General exam: Appears calm and comfortable  Respiratory system: Clear to auscultation. Respiratory effort normal.  Currently on room air. Cardiovascular system: S1 & S2 heard, RRR. No JVD, murmurs, rubs, gallops or clicks. No pedal edema. Gastrointestinal system: Abdomen is nondistended, soft and nontender. No organomegaly or masses felt. Normal bowel sounds heard. Central nervous system: Alert and oriented. No focal neurological deficits. Extremities: Symmetric 5 x 5 power. Skin: No rashes, lesions or ulcers Psychiatry: Judgement and insight appear normal. Mood & affect appropriate.     Data Reviewed: I have personally reviewed following labs and imaging studies  CBC: Recent Labs  Lab 06/26/19 1000 06/27/19 0422 06/28/19 0529 06/29/19 0432 06/30/19 0459 07/02/19 0353  WBC 29.7* 29.2* 22.9* 18.5* 16.4* 22.4*  NEUTROABS 25.1*  --   --   --   --   --  HGB 15.8 16.7 15.0 14.0 13.4 13.1  HCT 48.8 54.1* 47.3 45.7 43.6 43.0  MCV 85.0 88.1 85.7 87.5 86.9 86.3  PLT 344 330 320 272 279 678   Basic Metabolic Panel: Recent Labs  Lab 06/28/19 0529 06/28/19 1329 06/29/19 0432 06/30/19 0459 07/01/19 0437 07/02/19 0353  NA 136  --  136 135 135 133*  K 4.4  --  4.1 4.2 4.9 4.9  CL 102  --  102 102 101 99  CO2 23  --  25 25 25 24   GLUCOSE 260* 462* 190* 203* 326* 349*  BUN 41*  --  31* 23* 27* 29*  CREATININE 0.84  --  0.69 0.58* 0.58* 0.71   CALCIUM 8.9  --  8.7* 8.7* 9.2 9.1   GFR: Estimated Creatinine Clearance: 124 mL/min (by C-G formula based on SCr of 0.71 mg/dL). Liver Function Tests: Recent Labs  Lab 06/26/19 1000 07/02/19 0353  AST 20 12*  ALT 20 11  ALKPHOS 96 61  BILITOT 0.4 0.3  PROT 8.2* 6.3*  ALBUMIN 3.0* 2.3*   No results for input(s): LIPASE, AMYLASE in the last 168 hours. No results for input(s): AMMONIA in the last 168 hours. Coagulation Profile: Recent Labs  Lab 06/26/19 1336  INR 1.6*   Cardiac Enzymes: No results for input(s): CKTOTAL, CKMB, CKMBINDEX, TROPONINI in the last 168 hours. BNP (last 3 results) No results for input(s): PROBNP in the last 8760 hours. HbA1C: No results for input(s): HGBA1C in the last 72 hours. CBG: Recent Labs  Lab 06/30/19 2030 07/01/19 0808 07/01/19 1128 07/01/19 1630 07/01/19 2111  GLUCAP 376* 278* 450* 325* 211*   Lipid Profile: No results for input(s): CHOL, HDL, LDLCALC, TRIG, CHOLHDL, LDLDIRECT in the last 72 hours. Thyroid Function Tests: No results for input(s): TSH, T4TOTAL, FREET4, T3FREE, THYROIDAB in the last 72 hours. Anemia Panel: No results for input(s): VITAMINB12, FOLATE, FERRITIN, TIBC, IRON, RETICCTPCT in the last 72 hours. Sepsis Labs: Recent Labs  Lab 06/26/19 1336 06/27/19 0422  PROCALCITON 0.22 0.25    Recent Results (from the past 240 hour(s))  SARS Coronavirus 2 by RT PCR (hospital order, performed in [facility] hospital lab) Nasopharyngeal Nasopharyngeal Swab     Status: None   Collection Time: 06/26/19 11:06 AM   Specimen: Nasopharyngeal Swab  Result Value Ref Range Status   SARS Coronavirus 2 NEGATIVE NEGATIVE Final    Comment: (NOTE) If result is NEGATIVE SARS-CoV-2 target nucleic acids are NOT DETECTED. The SARS-CoV-2 RNA is generally detectable in upper and lower  respiratory specimens during the acute phase of infection. The lowest  concentration of SARS-CoV-2 viral copies this assay can detect is 250   copies / mL. A negative result does not preclude SARS-CoV-2 infection  and should not be used as the sole basis for treatment or other  patient management decisions.  A negative result may occur with  improper specimen collection / handling, submission of specimen other  than nasopharyngeal swab, presence of viral mutation(s) within the  areas targeted by this assay, and inadequate number of viral copies  (<250 copies / mL). A negative result must be combined with clinical  observations, patient history, and epidemiological information. If result is POSITIVE SARS-CoV-2 target nucleic acids are DETECTED. The SARS-CoV-2 RNA is generally detectable in upper and lower  respiratory specimens dur ing the acute phase of infection.  Positive  results are indicative of active infection with SARS-CoV-2.  Clinical  correlation with patient history and other diagnostic information is  necessary to determine patient infection status.  Positive results do  not rule out bacterial infection or co-infection with other viruses. If result is PRESUMPTIVE POSTIVE SARS-CoV-2 nucleic acids MAY BE PRESENT.   A presumptive positive result was obtained on the submitted specimen  and confirmed on repeat testing.  While 2019 novel coronavirus  (SARS-CoV-2) nucleic acids may be present in the submitted sample  additional confirmatory testing may be necessary for epidemiological  and / or clinical management purposes  to differentiate between  SARS-CoV-2 and other Sarbecovirus currently known to infect humans.  If clinically indicated additional testing with an alternate test  methodology [phone]) is advised. The SARS-CoV-2 RNA is generally  detectable in upper and lower respiratory sp ecimens during the acute  phase of infection. The expected result is Negative. Fact Sheet for Patients:  StrictlyIdeas.no Fact Sheet for Healthcare Providers: BankingDealers.co.za  This test is not yet approved or cleared by the [place] FDA and has been authorized for detection and/or diagnosis of SARS-CoV-2 by FDA under an Emergency Use Authorization (EUA).  This EUA will remain in effect (meaning this test can be used) for the duration of the COVID-19 declaration under Section 564(b)(1) of the Act, 21 U.S.C. section 360bbb-3(b)(1), unless the authorization is terminated or revoked sooner. Performed at [facility], [address] [place], [place] [address]   Blood Culture (routine x 2)     Status: None (Preliminary result)   Collection Time: 06/26/19 11:21 AM   Specimen: BLOOD RIGHT ARM  Result Value Ref Range Status   Specimen Description BLOOD RIGHT ARM  Final   Special Requests   Final    BOTTLES DRAWN AEROBIC AND ANAEROBIC Blood Culture adequate volume   Culture   Final    NO GROWTH 4 DAYS Performed at [facility], [address], [place], [place] [address]    Report Status PENDING  Incomplete  Blood Culture (routine x 2)     Status: None (Preliminary result)   Collection Time: 06/26/19 11:32 AM   Specimen: BLOOD RIGHT FOREARM  Result Value Ref Range Status   Specimen Description BLOOD RIGHT FOREARM  Final   Special Requests   Final    BOTTLES DRAWN AEROBIC AND ANAEROBIC Blood Culture adequate volume   Culture   Final    NO GROWTH 4 DAYS Performed at [facility] [address] [place], [place] [address]    Report Status PENDING  Incomplete  MRSA PCR Screening     Status: None   Collection Time: 06/26/19  6:15 PM   Specimen: Nasal Mucosa; Nasopharyngeal  Result Value Ref Range Status   MRSA by PCR NEGATIVE NEGATIVE Final    Comment:        The GeneXpert MRSA Assay (FDA approved for NASAL specimens only), is one component of a comprehensive MRSA colonization surveillance program. It is not intended to diagnose MRSA infection nor to guide or monitor treatment for MRSA infections. Performed at [facility], [address]  [place], [place] [address]          Radiology Studies: No results found.      Scheduled Meds: . acetylcysteine  2 mL Nebulization TID  . albuterol  8 puff Inhalation Once  . budesonide (PULMICORT) nebulizer solution  0.5 mg Nebulization BID  . Chlorhexidine Gluconate Cloth  6 each Topical Daily  . feeding supplement (GLUCERNA SHAKE)  237 mL Oral TID BM  . guaiFENesin  1,200 mg Oral BID  . insulin aspart  0-20 Units Subcutaneous  TID WC  . insulin aspart  0-5 Units Subcutaneous QHS  . insulin aspart  8 Units Subcutaneous TID WC  . insulin detemir  25 Units Subcutaneous Daily  . ipratropium-albuterol  3 mL Nebulization Q6H  . methylPREDNISolone (SOLU-MEDROL) injection  60 mg Intravenous Q12H  . varenicline  0.5 mg Oral BID   Continuous Infusions: . azithromycin 500 mg (07/01/19 1304)  . cefTRIAXone (ROCEPHIN)  IV 2 g (07/01/19 1135)     LOS: 6 days    Time spent: 30 minutes    Maisie Hauser Darleen Crocker, DO [facility] Pager [phone]  If 7PM-7AM, please contact night-coverage [email or web address] Password [email or web address] 07/02/2019, 7:20 AM

## 2019-07-03 ENCOUNTER — Inpatient Hospital Stay (HOSPITAL_COMMUNITY): Payer: Medicare HMO

## 2019-07-03 LAB — CBC
HCT: 47.1 % (ref 39.0–52.0)
Hemoglobin: 14.8 g/dL (ref 13.0–17.0)
MCH: 27.2 pg (ref 26.0–34.0)
MCHC: 31.4 g/dL (ref 30.0–36.0)
MCV: 86.4 fL (ref 80.0–100.0)
Platelets: 212 10*3/uL (ref 150–400)
RBC: 5.45 MIL/uL (ref 4.22–5.81)
RDW: 14.4 % (ref 11.5–15.5)
WBC: 31.4 10*3/uL — ABNORMAL HIGH (ref 4.0–10.5)
nRBC: 0 % (ref 0.0–0.2)

## 2019-07-03 LAB — BASIC METABOLIC PANEL
Anion gap: 11 (ref 5–15)
BUN: 35 mg/dL — ABNORMAL HIGH (ref 6–20)
CO2: 24 mmol/L (ref 22–32)
Calcium: 9.3 mg/dL (ref 8.9–10.3)
Chloride: 102 mmol/L (ref 98–111)
Creatinine, Ser: 0.7 mg/dL (ref 0.61–1.24)
GFR calc Af Amer: >60 mL/min (ref >60)
GFR calc non Af Amer: >60 mL/min (ref >60)
Glucose, Bld: 292 mg/dL — ABNORMAL HIGH (ref 70–99)
Potassium: 4.9 mmol/L (ref 3.5–5.1)
Sodium: 137 mmol/L (ref 135–145)

## 2019-07-03 LAB — GLUCOSE, CAPILLARY
Glucose-Capillary: 264 mg/dL — ABNORMAL HIGH (ref 70–99)
Glucose-Capillary: 240 mg/dL — ABNORMAL HIGH (ref 70–99)
Glucose-Capillary: 264 mg/dL — ABNORMAL HIGH (ref 70–99)
Glucose-Capillary: 163 mg/dL — ABNORMAL HIGH (ref 70–99)

## 2019-07-03 MED ORDER — SODIUM CHLORIDE 0.9 % IV SOLN
3.0000 g | Freq: Four times a day (QID) | INTRAVENOUS | Status: DC
  Administered 2019-07-03 – 2019-07-10 (×28): 3 g via INTRAVENOUS
  Filled 2019-07-03: qty 3
  Filled 2019-07-03 (×2): qty 8
  Filled 2019-07-03: qty 3
  Filled 2019-07-03 (×2): qty 8
  Filled 2019-07-03 (×2): qty 3
  Filled 2019-07-03 (×8): qty 8
  Filled 2019-07-03: qty 3
  Filled 2019-07-03 (×2): qty 8
  Filled 2019-07-03: qty 3
  Filled 2019-07-03 (×5): qty 8
  Filled 2019-07-03: qty 3
  Filled 2019-07-03 (×3): qty 8

## 2019-07-03 MED ORDER — INSULIN DETEMIR 100 UNIT/ML ~~LOC~~ SOLN
30.0000 [IU] | Freq: Every day | SUBCUTANEOUS | Status: DC
  Administered 2019-07-04 – 2019-07-05 (×2): 30 [IU] via SUBCUTANEOUS
  Filled 2019-07-03 (×3): qty 0.3

## 2019-07-03 NOTE — Progress Notes (Signed)
Craig NOTE    COLTRANE TUGWELL  TML:[id number] DOB: 1960/12/02 DOA: 06/26/2019 PCP: Lucia Gaskins, Craig   Brief Narrative:  Per HPI: [name] a 58 y.o.malewith medical history Owens, Craig Owens, Craig Owens, Craig Owens, Craig Owens 2 hours prior to admission. The patient denies any fevers, chills, headache, neck pain, nausea, vomiting, diarrhea, abdominal pain. He has had a small amount of hemoptysis for 1 day. He denies any hematemesis, hematochezia, melena. Notably, the patient states that he was told that he had "lung cancer" Owens 5 years ago by his primary care provider, but the patient was lost to follow-up and never followed through with work-up. Nevertheless, the patient continued to smoke Owens 2 packs/day for the last 40 years. He uses Craig but denies any other illegal drug Owens. Upon EMS arrival, the patient was noted to be hypoxic with oxygen saturation of 80% on 6 L. The patient was subsequently placed on nonrebreather in the emergency department with oxygen saturations 96-98%. In the emergency department, the patient was afebrile, tachycardicwith HR 110-120, and hypoxic requiring a nonrebreather with oxygen saturation 98%. CT angiogram chest showed pulmonary embolus in the bilateral distal main pulmonary artery with extension into the upper lobes and lower lobes bilateral. There was RV strain.  10/14:Patient appears to be doing well this morning with stable vital signs noted. He is actually on room air saturating near 100%. He denies any other symptoms and 2D echocardiogram is pending. We will plan to increase insulin coverage and wean steroids. Palliative consulted for goals of care discussion.  10/15:Patient continues to do well and has some minimal anxiety with hemoptysis this morning. 2D echocardiogram with LVEF 55-60%. Palliative  consultation pending. Plan to transfer to telemetry.  10/16:Patient continues to have excessive sputum output and appears congested. He is only minimally anxious, but likes to stay on his nonrebreather mask. No other acute events otherwise noted. Blood glucose levels are more stable with insulin adjustments yesterday. We will plan to add chest physiotherapy Mucomyst today.  10/19: Patient has completed course of IV heparin over the course of the weekend and will transition to Eliquis today.  Plan for PT evaluation and weaning of oxygen as well as steroids.  Continue IV antibiotics for 2 more days.  Continue blood glucose control.  10/20: Patient has struggled on and off with Owens of BiPAP overnight and wants to remain on nonrebreather mask.  Consulted pulmonology with plans for repeat CT chest given some new cavitary lesion seen on chest x-ray.  Patient will be started on IV Unasyn with repeat CT chest.  Continue Eliquis.  Increase Lantus dose to 30 units daily.   Assessment & Plan:   Principal Problem:   Acute pulmonary embolus (HCC) Active Problems:   Craig Owens   Acute respiratory failure with hypoxia (HCC)   COPD with acute exacerbation ([place])   Uncontrolled type 2 diabetes mellitus with hyperglycemia ([place])   Palliative care by specialist   Goals of care, counseling/discussion   Anxiety state   Lung cancer ([place])   PNA (pneumonia)   Chronic pain syndrome   Demand ischemia ([place])   Acute hypoxemic respiratory failure secondary to submassive PE-persistent -Case discussed with pulmonology on admission who did not feel TPA currently indicated -Discontinue IV heparin today and transition to Eliquis with anticipated lifelong therapy -Continue to monitor closely and likely transfer to telemetrytoday -2D echocardiogramwith LVEF 55-60%, normal RV size, but not well  visualized -COVID testing negative -Repeat CT chest per pulmonology today  Acute COPD exacerbation with possible  pneumonia-new cavitary lesions on chest x-ray -Discontinue Rocephin and azithromycin and switch to Unasyn per pulmonology recommendations -Continue IV Solu-Medrol -Breathing treatments to as needed -Continue Pulmicort -Add Mucomyst as well as flutter valve and chest PT for congestion  Pulmonary opacities/nodules -Suspicious for cavitary lung cancer and and bony metastasis -We will have follow-up with oncology in the near future,patient would like to remain aggressive with care -Patient was apparently told he had lung cancer 5 years ago but never pursued work-up  Elevated troponin secondary to PE with RV strain -2D echocardiogram with LVEF 55-60% without significant RV issues noted -Does not appear to be clinically significant for ACS  Type 2 diabetes with uncontrolled hyperglycemia -Hemoglobin A1c 8.1% -Levemir increased to 25 units, plan to wean steroids -Continue SSI and carb modified diet and follow carefully  Craig CVA 5 years ago -Hold aspirin and Plavix while on heparin drip -Lipid panel with LDL of 53  Craig and Craig Owens -Urine drug screen with no acute findings -Discussed Craig cessation and will consider nicotine patch as needed  Chronic pain syndrome -Patient currently on Percocet 5/325 as needed for pain control -Queried on PMP aware   DVT prophylaxis:Eliquis Code Status:Full Family Communication:None at bedside Disposition Plan:Switch to IV Unasyn today and plans for repeat CT chest per pulmonology.   Consultants:  Palliative  Pulmonology  Procedures:  None  Antimicrobials:  Anti-infectives (From admission, onward)   Start     Dose/Rate Route Frequency Ordered Stop   07/03/19 0900  Ampicillin-Sulbactam (UNASYN) 3 g in sodium chloride 0.9 % 100 mL IVPB     3 g 200 mL/hr over 30 Minutes Intravenous Every 6 hours 07/03/19 0808     07/02/19 1115  azithromycin (ZITHROMAX) 500 mg in sodium chloride 0.9 % 250 mL IVPB     500  mg 250 mL/hr over 60 Minutes Intravenous Every 24 hours 07/02/19 0947 07/02/19 1210   07/02/19 1115  cefTRIAXone (ROCEPHIN) 2 g in sodium chloride 0.9 % 100 mL IVPB     2 g 200 mL/hr over 30 Minutes Intravenous Every 24 hours 07/02/19 0947 07/02/19 1056   06/26/19 1115  cefTRIAXone (ROCEPHIN) 2 g in sodium chloride 0.9 % 100 mL IVPB  Status:  Discontinued     2 g 200 mL/hr over 30 Minutes Intravenous Every 24 hours 06/26/19 1105 07/02/19 0947   06/26/19 1115  azithromycin (ZITHROMAX) 500 mg in sodium chloride 0.9 % 250 mL IVPB  Status:  Discontinued     500 mg 250 mL/hr over 60 Minutes Intravenous Every 24 hours 06/26/19 1105 07/02/19 0947       Subjective: Patient seen and evaluated today with ongoing hypoxemia and intermittent Owens of BiPAP overnight.  He is noted to have some new cavitary lesions on his chest x-ray.  Objective: Vitals:   07/03/19 0500 07/03/19 0600 07/03/19 0717 07/03/19 0800  BP:  131/84  138/85  Pulse: 91 94 88 94  Resp: (!) 23 (!) 32 (!) 31 (!) 34  Temp:   97.7 F (36.5 C)   TempSrc:   Oral   SpO2: 100% 95% 100% 100%  Weight: 102.5 kg     Height:        Intake/Output Summary (Last 24 hours) at 07/03/2019 1003 Last data filed at 07/03/2019 0630 Gross per 24 hour  Intake 100 ml  Output 1900 ml  Net -1800 ml   [employer]  06/28/19 0500 07/02/19 0500 07/03/19 0500  Weight: 102.2 kg 103.4 kg 102.5 kg    Examination:  General exam: Appears calm and comfortable  Respiratory system: Clear to auscultation. Respiratory effort normal.  On nonrebreather mask. Cardiovascular system: S1 & S2 heard, RRR. No JVD, murmurs, rubs, gallops or clicks. No pedal edema. Gastrointestinal system: Abdomen is nondistended, soft and nontender. No organomegaly or masses felt. Normal bowel sounds heard. Central nervous system: Alert and oriented. No focal neurological deficits. Extremities: Symmetric 5 x 5 power. Skin: No rashes, lesions or ulcers Psychiatry: Judgement  and insight appear normal. Mood & affect appropriate.     Data Reviewed: I have personally reviewed following labs and imaging studies  CBC: Recent Labs  Lab 06/28/19 0529 06/29/19 0432 06/30/19 0459 07/02/19 0353 07/03/19 0413  WBC 22.9* 18.5* 16.4* 22.4* 31.4*  HGB 15.0 14.0 13.4 13.1 14.8  HCT 47.3 45.7 43.6 43.0 47.1  MCV 85.7 87.5 86.9 86.3 86.4  PLT 320 272 279 308 161   Basic Metabolic Panel: Recent Labs  Lab 06/29/19 0432 06/30/19 0459 07/01/19 0437 07/02/19 0353 07/03/19 0413  NA 136 135 135 133* 137  K 4.1 4.2 4.9 4.9 4.9  CL 102 102 101 99 102  CO2 25 25 25 24 24   GLUCOSE 190* 203* 326* 349* 292*  BUN 31* 23* 27* 29* 35*  CREATININE 0.69 0.58* 0.58* 0.71 0.70  CALCIUM 8.7* 8.7* 9.2 9.1 9.3   GFR: Estimated Creatinine Clearance: 123.5 mL/min (by C-G formula based on SCr of 0.7 mg/dL). Liver Function Tests: Recent Labs  Lab 07/02/19 0353  AST 12*  ALT 11  ALKPHOS 61  BILITOT 0.3  PROT 6.3*  ALBUMIN 2.3*   No results for input(s): LIPASE, AMYLASE in the last 168 hours. No results for input(s): AMMONIA in the last 168 hours. Coagulation Profile: Recent Labs  Lab 06/26/19 1336  INR 1.6*   Cardiac Enzymes: No results for input(s): CKTOTAL, CKMB, CKMBINDEX, TROPONINI in the last 168 hours. BNP (last 3 results) No results for input(s): PROBNP in the last 8760 hours. HbA1C: No results for input(s): HGBA1C in the last 72 hours. CBG: Recent Labs  Lab 07/02/19 0740 07/02/19 1119 07/02/19 1620 07/02/19 2025 07/03/19 0716  GLUCAP 310* 422* 263* 304* 264*   Lipid Profile: No results for input(s): CHOL, HDL, LDLCALC, TRIG, CHOLHDL, LDLDIRECT in the last 72 hours. Thyroid Function Tests: No results for input(s): TSH, T4TOTAL, FREET4, T3FREE, THYROIDAB in the last 72 hours. Anemia Panel: No results for input(s): VITAMINB12, FOLATE, FERRITIN, TIBC, IRON, RETICCTPCT in the last 72 hours. Sepsis Labs: Recent Labs  Lab 06/26/19 1336  06/27/19 0422  PROCALCITON 0.22 0.25    Recent Results (from the past 240 hour(s))  SARS Coronavirus 2 by RT PCR (hospital order, performed in [facility] hospital lab) Nasopharyngeal Nasopharyngeal Swab     Status: None   Collection Time: 06/26/19 11:06 AM   Specimen: Nasopharyngeal Swab  Result Value Ref Range Status   SARS Coronavirus 2 NEGATIVE NEGATIVE Final    Comment: (NOTE) If result is NEGATIVE SARS-CoV-2 target nucleic acids are NOT DETECTED. The SARS-CoV-2 RNA is generally detectable in upper and lower  respiratory specimens during the acute phase of infection. The lowest  concentration of SARS-CoV-2 viral copies this assay can detect is 250  copies / mL. A negative result does not preclude SARS-CoV-2 infection  and should not be used as the sole basis for treatment or other  patient management decisions.  A negative result may  occur with  improper specimen collection / handling, submission of specimen other  than nasopharyngeal swab, presence of viral mutation(s) within the  areas targeted by this assay, and inadequate number of viral copies  (<250 copies / mL). A negative result must be combined with clinical  observations, patient history, and epidemiological information. If result is POSITIVE SARS-CoV-2 target nucleic acids are DETECTED. The SARS-CoV-2 RNA is generally detectable in upper and lower  respiratory specimens dur ing the acute phase of infection.  Positive  results are indicative of active infection with SARS-CoV-2.  Clinical  correlation with patient history and other diagnostic information is  necessary to determine patient infection status.  Positive results do  not rule out bacterial infection or co-infection with other viruses. If result is PRESUMPTIVE POSTIVE SARS-CoV-2 nucleic acids MAY BE PRESENT.   A presumptive positive result was obtained on the submitted specimen  and confirmed on repeat testing.  While 2019 novel coronavirus  (SARS-CoV-2)  nucleic acids may be present in the submitted sample  additional confirmatory testing may be necessary for epidemiological  and / or clinical management purposes  to differentiate between  SARS-CoV-2 and other Sarbecovirus currently known to infect humans.  If clinically indicated additional testing with an alternate test  methodology [phone]) is advised. The SARS-CoV-2 RNA is generally  detectable in upper and lower respiratory sp ecimens during the acute  phase of infection. The expected result is Negative. Fact Sheet for Patients:  StrictlyIdeas.no Fact Sheet for Healthcare Providers: BankingDealers.co.za This test is not yet approved or cleared by the [place] FDA and has been authorized for detection and/or diagnosis of SARS-CoV-2 by FDA under an Emergency Owens Authorization (EUA).  This EUA will remain in effect (meaning this test can be used) for the duration of the COVID-19 declaration under Section 564(b)(1) of the Act, 21 U.S.C. section 360bbb-3(b)(1), unless the authorization is terminated or revoked sooner. Performed at [facility], [address] [place], [place] [address]   Blood Culture (routine x 2)     Status: None   Collection Time: 06/26/19 11:21 AM   Specimen: BLOOD RIGHT ARM  Result Value Ref Range Status   Specimen Description BLOOD RIGHT ARM  Final   Special Requests   Final    BOTTLES DRAWN AEROBIC AND ANAEROBIC Blood Culture adequate volume   Culture   Final    NO GROWTH 6 DAYS Performed at [facility], [address], [place], [place] [address]    Report Status 07/02/2019 FINAL  Final  Blood Culture (routine x 2)     Status: None   Collection Time: 06/26/19 11:32 AM   Specimen: BLOOD RIGHT FOREARM  Result Value Ref Range Status   Specimen Description BLOOD RIGHT FOREARM  Final   Special Requests   Final    BOTTLES DRAWN AEROBIC AND ANAEROBIC Blood Culture adequate volume   Culture   Final    NO  GROWTH 6 DAYS Performed at [facility], [address] [place], [place] [address]    Report Status 07/02/2019 FINAL  Final  MRSA PCR Screening     Status: None   Collection Time: 06/26/19  6:15 PM   Specimen: Nasal Mucosa; Nasopharyngeal  Result Value Ref Range Status   MRSA by PCR NEGATIVE NEGATIVE Final    Comment:        The GeneXpert MRSA Assay (FDA approved for NASAL specimens only), is one component of a comprehensive MRSA colonization surveillance program. It is not intended to diagnose MRSA  infection nor to guide or monitor treatment for MRSA infections. Performed at [facility], [address], [place], [place] [address]          Radiology Studies: Dg Chest [facility] 1 View  Result Date: 07/02/2019 CLINICAL DATA:  Acute onset of chest pain and shortness of breath. COPD. Acute pulmonary embolism. EXAM: PORTABLE CHEST 1 VIEW COMPARISON:  06/26/2019 FINDINGS: Heart size remains normal. Peripheral infiltrate and pleural thickening in the lateral left lung base shows no significant change. Mild right upper lobe airspace disease has increased since previous study. New nodular opacity with central lucency possibly due to cavitation in the left lung apex. Persistent ill-defined pulmonary nodular opacity seen in the right lung apex without significant change. IMPRESSION: No significant change in peripheral infiltrate and pleural thickening in the lateral left lung base and ill-defined right atypical pulmonary nodule. New nodular opacity with central lucency in the left lung apex, suspicious for cavitation. Differential diagnosis includes pulmonary infarcts, pneumonia, and neoplasm. Electronically Signed   By: Marlaine Hind M.D.   On: 07/02/2019 19:30        Scheduled Meds:  acetylcysteine  2 mL Nebulization TID   albuterol  8 puff Inhalation Once   apixaban  10 mg Oral BID   Followed by   Derrill Memo ON 07/09/2019] apixaban  5 mg Oral BID   budesonide (PULMICORT) nebulizer  solution  0.5 mg Nebulization BID   Chlorhexidine Gluconate Cloth  6 each Topical Daily   feeding supplement (GLUCERNA SHAKE)  237 mL Oral TID BM   guaiFENesin  1,200 mg Oral BID   insulin aspart  0-20 Units Subcutaneous TID WC   insulin aspart  0-5 Units Subcutaneous QHS   insulin aspart  10 Units Subcutaneous TID WC   [START ON 07/04/2019] insulin detemir  30 Units Subcutaneous Daily   ipratropium  0.5 mg Nebulization Q6H   methylPREDNISolone (SOLU-MEDROL) injection  40 mg Intravenous Q12H   varenicline  0.5 mg Oral BID   Continuous Infusions:  ampicillin-sulbactam (UNASYN) IV 3 g (07/03/19 0857)     LOS: 7 days    Time spent: 30 minutes    Tearia Gibbs Darleen Crocker, DO [facility] Pager [phone]  If 7PM-7AM, please contact night-coverage [email or web address] Password [email or web address] 07/03/2019, 10:03 AM

## 2019-07-03 NOTE — Progress Notes (Signed)
Patient states that he does not want to wear BIPAP tonight. Will monitor through the night.

## 2019-07-03 NOTE — Progress Notes (Signed)
Pharmacy Antibiotic Note  Craig Owens is a 58 y.o. male admitted on 06/26/2019 with  acute pulmonary embolus. He just completed a 7-day course of both azithromyzin and ceftriaxone for COPD exacerbation. Pharmacy has been consulted for Unasyn dosing for cavitary pneumonia of the left lower lobe.   Plan: Start Unasyn 3g IV q6h Pharmacy will continue to monitor labs, cultures and patient progress.  Height: 6\' 1"  (185.4 cm) Weight: 225 lb 15.5 oz (102.5 kg) IBW/kg (Calculated) : 79.9  Temp (24hrs), Avg:97.8 F (36.6 C), Min:97.5 F (36.4 C), Max:98.3 F (36.8 C)  Recent Labs  Lab 06/28/19 0529 06/29/19 0432 06/30/19 0459 07/01/19 0437 07/02/19 0353 07/03/19 0413  WBC 22.9* 18.5* 16.4*  --  22.4* 31.4*  CREATININE 0.84 0.69 0.58* 0.58* 0.71 0.70    Estimated Creatinine Clearance: 123.5 mL/min (by C-G formula based on SCr of 0.7 mg/dL).    Allergies  Allergen Reactions  . Adhesive [Tape] Other (See Comments)    Blisters skin, peels off. Please use paper tape.   . Codeine Hives, Itching and Swelling  . Latex Hives    Antimicrobials this admission: azithromycin 10/13>> 10/20  ceftriaxone 10/13>>10/20 Unasyn 10/20>>    Microbiology results: 10-13 BC x2: ng x6 days 10/13  MRSA PCR:  neg  Thank you for allowing pharmacy to be a part of this patient's care.  Despina Pole 07/03/2019 8:31 AM
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
## 2019-07-04 LAB — CBC
HCT: 46.4 % (ref 39.0–52.0)
Hemoglobin: 14.4 g/dL (ref 13.0–17.0)
MCH: 26.8 pg (ref 26.0–34.0)
MCHC: 31 g/dL (ref 30.0–36.0)
MCV: 86.4 fL (ref 80.0–100.0)
Platelets: 163 10*3/uL (ref 150–400)
RBC: 5.37 MIL/uL (ref 4.22–5.81)
RDW: 14.6 % (ref 11.5–15.5)
WBC: 24.9 10*3/uL — ABNORMAL HIGH (ref 4.0–10.5)
nRBC: 0 % (ref 0.0–0.2)

## 2019-07-04 LAB — BASIC METABOLIC PANEL
Anion gap: 10 (ref 5–15)
BUN: 35 mg/dL — ABNORMAL HIGH (ref 6–20)
CO2: 25 mmol/L (ref 22–32)
Calcium: 9.1 mg/dL (ref 8.9–10.3)
Chloride: 100 mmol/L (ref 98–111)
Creatinine, Ser: 0.65 mg/dL (ref 0.61–1.24)
GFR calc Af Amer: >60 mL/min (ref >60)
GFR calc non Af Amer: >60 mL/min (ref >60)
Glucose, Bld: 221 mg/dL — ABNORMAL HIGH (ref 70–99)
Potassium: 4.4 mmol/L (ref 3.5–5.1)
Sodium: 135 mmol/L (ref 135–145)

## 2019-07-04 LAB — GLUCOSE, CAPILLARY
Glucose-Capillary: 219 mg/dL — ABNORMAL HIGH (ref 70–99)
Glucose-Capillary: 316 mg/dL — ABNORMAL HIGH (ref 70–99)
Glucose-Capillary: 279 mg/dL — ABNORMAL HIGH (ref 70–99)
Glucose-Capillary: 311 mg/dL — ABNORMAL HIGH (ref 70–99)

## 2019-07-04 NOTE — TOC Initial Note (Signed)
Transition of Care [facility]) - Initial/Assessment Note    Patient Details  Name: Craig Owens MRN: [id number] Date of Birth: [birth year]  [facility] CM/SW Contact:    Ihor Gully, LCSW Phone Number: 07/04/2019, 4:23 PM  Clinical Narrative:                 Patient from home alone. Admitted for acute respiratory failure wit hypoxia.  At baseline he is independent, drives and ambulates with a cane. PT evaluation was discussed and patient is agreeable to HHPT. Patient is aware that he may need home oxygen at discharge.  TOC will follow patient through discharge and address clinical needs as they arise.   Expected Discharge Plan: [place] Barriers to Discharge: Continued Medical Work up   Patient Goals and CMS Choice Patient states their goals for this hospitalization and ongoing recovery are:: Patient wants to go home. He does not want a nursing facility.      Expected Discharge Plan and Services Expected Discharge Plan: [place] arrangements for the past 2 months: Single Family Home                                      Prior Living Arrangements/Services Living arrangements for the past 2 months: Single Family Home Lives with:: Self Patient language and need for interpreter reviewed:: Yes Do you feel safe going back to the place where you live?: Yes      Need for Family Participation in Patient Care: Yes (Comment) Care giver support system in place?: Yes (comment)   Criminal Activity/Legal Involvement Pertinent to Current Situation/Hospitalization: No - Comment as needed  Activities of Daily Living Home Assistive Devices/Equipment: None, Eyeglasses ADL Screening (condition at time of admission) Patient's cognitive ability adequate to safely complete daily activities?: Yes Is the patient deaf or have difficulty hearing?: No Does the patient have difficulty seeing, even when wearing  glasses/contacts?: No Does the patient have difficulty concentrating, remembering, or making decisions?: No Patient able to express need for assistance with ADLs?: Yes Does the patient have difficulty dressing or bathing?: No Independently performs ADLs?: Yes (appropriate for developmental age) Does the patient have difficulty walking or climbing stairs?: No Weakness of Legs: Both Weakness of Arms/Hands: None  Permission Sought/Granted                  Emotional Assessment Appearance:: Appears stated age   Affect (typically observed): Appropriate Orientation: : Oriented to Self, Oriented to Place, Oriented to  Time, Oriented to Situation Alcohol / Substance Use: Not Applicable Psych Involvement: No (comment)  Admission diagnosis:  PE (pulmonary thromboembolism) ([place]) [I26.99] Hypoxia [R09.02] Patient Active Problem List   Diagnosis Date Noted  . Lung cancer ([place]) 06/30/2019  . PNA (pneumonia) 06/30/2019  . Chronic pain syndrome 06/30/2019  . Demand ischemia ([place]) 06/30/2019  . Palliative care by specialist   . Goals of care, counseling/discussion   . Anxiety state   . Acute pulmonary embolus ([place]) 06/26/2019  . Acute respiratory failure with hypoxia ([place]) 06/26/2019  . COPD with acute exacerbation ([place]) 06/26/2019  . Uncontrolled type 2 diabetes mellitus with hyperglycemia ([place]) 06/26/2019  . Nephrolithiasis 04/06/2016  . Unstable angina ([place])   . Preoperative clearance   . Marijuana abuse   . Obesity   . Tobacco abuse   . Small  bowel obstruction ([place]) 07/09/2015  . Stroke ([place]) 04/24/2012  . Chest pain 04/24/2012  . Smoker 04/24/2012  . GERD 04/16/2010  . CONSTIPATION, INTERMITTENT 04/16/2010  . BLOOD IN STOOL 04/16/2010  . DYSPHAGIA UNSPECIFIED 04/16/2010  . ABDOMINAL PAIN, CHRONIC 04/16/2010   PCP:  Lucia Gaskins, MD Pharmacy:   [place], [place] [address] [place] [address] Phone: [phone] Fax:  [phone]     Social Determinants of Health (SDOH) Interventions    Readmission Risk Interventions No flowsheet data found.

## 2019-07-04 NOTE — Progress Notes (Signed)
PROGRESS NOTE    Craig Owens  JOA:[id number] DOB: Jan 26, 1961 DOA: 06/26/2019 PCP: Lucia Gaskins, MD   Brief Narrative:  Per HPI: Craig Owens a 58 y.o.malewith medical history ofCOPD, tobacco abuse, nonobstructive CAD, THC use, remote stroke presenting with acute onset of shortness of breath and chest discomfort approximately 2 hours prior to admission. The patient denies any fevers, chills, headache, neck pain, nausea, vomiting, diarrhea, abdominal pain. He has had a small amount of hemoptysis for 1 day. He denies any hematemesis, hematochezia, melena. Notably, the patient states that he was told that he had "lung cancer" approximately 5 years ago by his primary care provider, but the patient was lost to follow-up and never followed through with work-up. Nevertheless, the patient continued to smoke approximately 2 packs/day for the last 40 years. He uses THC but denies any other illegal drug use. Upon EMS arrival, the patient was noted to be hypoxic with oxygen saturation of 80% on 6 L. The patient was subsequently placed on nonrebreather in the emergency department with oxygen saturations 96-98%. In the emergency department, the patient was afebrile, tachycardicwith HR 110-120, and hypoxic requiring a nonrebreather with oxygen saturation 98%. CT angiogram chest showed pulmonary embolus in the bilateral distal main pulmonary artery with extension into the upper lobes and lower lobes bilateral. There was RV strain.  10/14:Patient appears to be doing well this morning with stable vital signs noted. He is actually on room air saturating near 100%. He denies any other symptoms and 2D echocardiogram is pending. We will plan to increase insulin coverage and wean steroids. Palliative consulted for goals of care discussion.  10/15:Patient continues to do well and has some minimal anxiety with hemoptysis this morning. 2D echocardiogram with LVEF 55-60%. Palliative  consultation pending. Plan to transfer to telemetry.  10/16:Patient continues to have excessive sputum output and appears congested. He is only minimally anxious, but likes to stay on his nonrebreather mask. No other acute events otherwise noted. Blood glucose levels are more stable with insulin adjustments yesterday. We will plan to add chest physiotherapy Mucomyst today.  10/19:Patient has completed course of IV heparin over the course of the weekend and will transition to Eliquis today. Plan for PT evaluation and weaning of oxygen as well as steroids. Continue IV antibiotics for 2 more days. Continue blood glucose control.  10/20: Patient has struggled on and off with use of BiPAP overnight and wants to remain on nonrebreather mask.  Consulted pulmonology with plans for repeat CT chest given some new cavitary lesion seen on chest x-ray.  Patient will be started on IV Unasyn with repeat CT chest.  Continue Eliquis.  Increase Lantus dose to 30 units daily.  10/21: Patient has refused wearing BiPAP last night and prefers nonrebreather.  CT chest with findings of groundglass changes throughout with confirmation of new cavitary lesion seen in the left upper lobe.  Patient remains on IV Unasyn as well as steroids.  Adequate blood glucose control noted today.  Assessment & Plan:   Principal Problem:   Acute pulmonary embolus (HCC) Active Problems:   Tobacco abuse   Acute respiratory failure with hypoxia (HCC)   COPD with acute exacerbation ([place])   Uncontrolled type 2 diabetes mellitus with hyperglycemia ([place])   Palliative care by specialist   Goals of care, counseling/discussion   Anxiety state   Lung cancer ([place])   PNA (pneumonia)   Chronic pain syndrome   Demand ischemia ([place])   Acute hypoxemic respiratory failure secondary to submassive  PE-persistent -Case discussed with pulmonologyon admissionwho did not feel TPA currently indicated -Discontinue IV heparin today and  transition to Eliquis with anticipated lifelong therapy -Continue to monitor closely and likely transfer to telemetrytoday -2D echocardiogramwith LVEF 55-60%, normal RV size, but not well visualized -COVID testing negative -Repeat CT chest with extensive groundglass changes noted in cavitary lesion  Acute COPD exacerbation with possible pneumonia-new cavitary lesions on chest x-ray -Discontinue Rocephin and azithromycin and switch to Unasyn per pulmonology recommendations -Continue IV Solu-Medrol -Breathing treatments to be scheduled -Continue Pulmicort -Add Mucomyst as well as flutter valve and chest PT for congestion  Pulmonary opacities/nodules -Suspicious for cavitary lung cancer and and bony metastasis -We will have follow-up with oncology in the near future,patient would like to remain aggressive with care -Patient was apparently told he had lung cancer 5 years ago but never pursued work-up  Elevated troponin secondary to PE with RV strain -2D echocardiogram with LVEF 55-60% without significant RV issues noted -Does not appear to be clinically significant for ACS  Type 2 diabetes with improved hyperglycemia -Hemoglobin A1c 8.1% -Levemir increased to 25 units, plan to wean steroids -Continue SSI and carb modified diet and follow carefully  Remote CVA 5 years ago -Hold aspirin and Plavix while on heparin drip -Lipid panel with LDL of 53  THC and tobacco abuse -Urine drug screen with no acute findings -Discussed tobacco cessation and will consider nicotine patch as needed  Chronic pain syndrome -Patient currently on Percocet 5/325 as needed for pain control -Queried on PMP aware   DVT prophylaxis:Eliquis Code Status:Full Family Communication:None at bedside Disposition Plan:Continue current treatments.  Appreciate pulmonology recommendations.   Consultants:  Palliative  Pulmonology  Procedures:  None  Antimicrobials:  Anti-infectives  (From admission, onward)   Start     Dose/Rate Route Frequency Ordered Stop   07/03/19 0900  Ampicillin-Sulbactam (UNASYN) 3 g in sodium chloride 0.9 % 100 mL IVPB     3 g 200 mL/hr over 30 Minutes Intravenous Every 6 hours 07/03/19 0808     07/02/19 1115  azithromycin (ZITHROMAX) 500 mg in sodium chloride 0.9 % 250 mL IVPB     500 mg 250 mL/hr over 60 Minutes Intravenous Every 24 hours 07/02/19 0947 07/02/19 1210   07/02/19 1115  cefTRIAXone (ROCEPHIN) 2 g in sodium chloride 0.9 % 100 mL IVPB     2 g 200 mL/hr over 30 Minutes Intravenous Every 24 hours 07/02/19 0947 07/02/19 1056   06/26/19 1115  cefTRIAXone (ROCEPHIN) 2 g in sodium chloride 0.9 % 100 mL IVPB  Status:  Discontinued     2 g 200 mL/hr over 30 Minutes Intravenous Every 24 hours 06/26/19 1105 07/02/19 0947   06/26/19 1115  azithromycin (ZITHROMAX) 500 mg in sodium chloride 0.9 % 250 mL IVPB  Status:  Discontinued     500 mg 250 mL/hr over 60 Minutes Intravenous Every 24 hours 06/26/19 1105 07/02/19 0947       Subjective: Patient seen and evaluated today with ongoing dyspnea.  He has refused to wear BiPAP overnight prefers nonrebreather.  Objective: Vitals:   07/04/19 0205 07/04/19 0400 07/04/19 0500 07/04/19 0600  BP:  123/73  137/68  Pulse:  93 95   Resp:  (!) 25 (!) 28 (!) 30  Temp:  97.6 F (36.4 C)    TempSrc:  Oral    SpO2: 100% 100% 93%   Weight:      Height:        Intake/Output Summary (Last 24  hours) at 07/04/2019 0724 Last data filed at 07/04/2019 0248 Gross per 24 hour  Intake 200 ml  Output 2100 ml  Net -1900 ml   Filed Weights   06/28/19 0500 07/02/19 0500 07/03/19 0500  Weight: 102.2 kg 103.4 kg 102.5 kg    Examination:  General exam: Appears calm and comfortable  Respiratory system: Clear to auscultation. Respiratory effort normal.  Continues on nonrebreather mask. Cardiovascular system: S1 & S2 heard, RRR. No JVD, murmurs, rubs, gallops or clicks. No pedal edema. Gastrointestinal  system: Abdomen is nondistended, soft and nontender. No organomegaly or masses felt. Normal bowel sounds heard. Central nervous system: Alert and awake. Extremities: Symmetric 5 x 5 power. Skin: No rashes, lesions or ulcers Psychiatry: Flat affect.    Data Reviewed: I have personally reviewed following labs and imaging studies  CBC: Recent Labs  Lab 06/29/19 0432 06/30/19 0459 07/02/19 0353 07/03/19 0413 07/04/19 0352  WBC 18.5* 16.4* 22.4* 31.4* 24.9*  HGB 14.0 13.4 13.1 14.8 14.4  HCT 45.7 43.6 43.0 47.1 46.4  MCV 87.5 86.9 86.3 86.4 86.4  PLT 272 279 308 212 401   Basic Metabolic Panel: Recent Labs  Lab 06/30/19 0459 07/01/19 0437 07/02/19 0353 07/03/19 0413 07/04/19 0352  NA 135 135 133* 137 135  K 4.2 4.9 4.9 4.9 4.4  CL 102 101 99 102 100  CO2 25 25 24 24 25   GLUCOSE 203* 326* 349* 292* 221*  BUN 23* 27* 29* 35* 35*  CREATININE 0.58* 0.58* 0.71 0.70 0.65  CALCIUM 8.7* 9.2 9.1 9.3 9.1   GFR: Estimated Creatinine Clearance: 123.5 mL/min (by C-G formula based on SCr of 0.65 mg/dL). Liver Function Tests: Recent Labs  Lab 07/02/19 0353  AST 12*  ALT 11  ALKPHOS 61  BILITOT 0.3  PROT 6.3*  ALBUMIN 2.3*   No results for input(s): LIPASE, AMYLASE in the last 168 hours. No results for input(s): AMMONIA in the last 168 hours. Coagulation Profile: No results for input(s): INR, PROTIME in the last 168 hours. Cardiac Enzymes: No results for input(s): CKTOTAL, CKMB, CKMBINDEX, TROPONINI in the last 168 hours. BNP (last 3 results) No results for input(s): PROBNP in the last 8760 hours. HbA1C: No results for input(s): HGBA1C in the last 72 hours. CBG: Recent Labs  Lab 07/02/19 2025 07/03/19 0716 07/03/19 1127 07/03/19 1605 07/03/19 2105  GLUCAP 304* 264* 240* 264* 163*   Lipid Profile: No results for input(s): CHOL, HDL, LDLCALC, TRIG, CHOLHDL, LDLDIRECT in the last 72 hours. Thyroid Function Tests: No results for input(s): TSH, T4TOTAL, FREET4,  T3FREE, THYROIDAB in the last 72 hours. Anemia Panel: No results for input(s): VITAMINB12, FOLATE, FERRITIN, TIBC, IRON, RETICCTPCT in the last 72 hours. Sepsis Labs: No results for input(s): PROCALCITON, LATICACIDVEN in the last 168 hours.  Recent Results (from the past 240 hour(s))  SARS Coronavirus 2 by RT PCR (hospital order, performed in [facility] hospital lab) Nasopharyngeal Nasopharyngeal Swab     Status: None   Collection Time: 06/26/19 11:06 AM   Specimen: Nasopharyngeal Swab  Result Value Ref Range Status   SARS Coronavirus 2 NEGATIVE NEGATIVE Final    Comment: (NOTE) If result is NEGATIVE SARS-CoV-2 target nucleic acids are NOT DETECTED. The SARS-CoV-2 RNA is generally detectable in upper and lower  respiratory specimens during the acute phase of infection. The lowest  concentration of SARS-CoV-2 viral copies this assay can detect is 250  copies / mL. A negative result does not preclude SARS-CoV-2 infection  and should not be  used as the sole basis for treatment or other  patient management decisions.  A negative result may occur with  improper specimen collection / handling, submission of specimen other  than nasopharyngeal swab, presence of viral mutation(s) within the  areas targeted by this assay, and inadequate number of viral copies  (<250 copies / mL). A negative result must be combined with clinical  observations, patient history, and epidemiological information. If result is POSITIVE SARS-CoV-2 target nucleic acids are DETECTED. The SARS-CoV-2 RNA is generally detectable in upper and lower  respiratory specimens dur ing the acute phase of infection.  Positive  results are indicative of active infection with SARS-CoV-2.  Clinical  correlation with patient history and other diagnostic information is  necessary to determine patient infection status.  Positive results do  not rule out bacterial infection or co-infection with other viruses. If result is PRESUMPTIVE  POSTIVE SARS-CoV-2 nucleic acids MAY BE PRESENT.   A presumptive positive result was obtained on the submitted specimen  and confirmed on repeat testing.  While 2019 novel coronavirus  (SARS-CoV-2) nucleic acids may be present in the submitted sample  additional confirmatory testing may be necessary for epidemiological  and / or clinical management purposes  to differentiate between  SARS-CoV-2 and other Sarbecovirus currently known to infect humans.  If clinically indicated additional testing with an alternate test  methodology [phone]) is advised. The SARS-CoV-2 RNA is generally  detectable in upper and lower respiratory sp ecimens during the acute  phase of infection. The expected result is Negative. Fact Sheet for Patients:  StrictlyIdeas.no Fact Sheet for Healthcare Providers: BankingDealers.co.za This test is not yet approved or cleared by the [place] FDA and has been authorized for detection and/or diagnosis of SARS-CoV-2 by FDA under an Emergency Use Authorization (EUA).  This EUA will remain in effect (meaning this test can be used) for the duration of the COVID-19 declaration under Section 564(b)(1) of the Act, 21 U.S.C. section 360bbb-3(b)(1), unless the authorization is terminated or revoked sooner. Performed at [facility], [address] [place], [place] [address]   Blood Culture (routine x 2)     Status: None   Collection Time: 06/26/19 11:21 AM   Specimen: BLOOD RIGHT ARM  Result Value Ref Range Status   Specimen Description BLOOD RIGHT ARM  Final   Special Requests   Final    BOTTLES DRAWN AEROBIC AND ANAEROBIC Blood Culture adequate volume   Culture   Final    NO GROWTH 6 DAYS Performed at [facility], [address], [place], [place] [address]    Report Status 07/02/2019 FINAL  Final  Blood Culture (routine x 2)     Status: None   Collection Time: 06/26/19 11:32 AM   Specimen: BLOOD RIGHT FOREARM  Result  Value Ref Range Status   Specimen Description BLOOD RIGHT FOREARM  Final   Special Requests   Final    BOTTLES DRAWN AEROBIC AND ANAEROBIC Blood Culture adequate volume   Culture   Final    NO GROWTH 6 DAYS Performed at [facility], [address], [place],  [address]    Report Status 07/02/2019 FINAL  Final  MRSA PCR Screening     Status: None   Collection Time: 06/26/19  6:15 PM   Specimen: Nasal Mucosa; Nasopharyngeal  Result Value Ref Range Status   MRSA by PCR NEGATIVE NEGATIVE Final    Comment:        The GeneXpert MRSA Assay (FDA approved for NASAL specimens  only), is one component of a comprehensive MRSA colonization surveillance program. It is not intended to diagnose MRSA infection nor to guide or monitor treatment for MRSA infections. Performed at [facility], [address], [place], [place] [address]          Radiology Studies: Ct Chest Wo Contrast  Result Date: 07/03/2019 CLINICAL DATA:  Follow-up abnormal chest radiograph recent pulmonary embolus. EXAM: CT CHEST WITHOUT CONTRAST TECHNIQUE: Multidetector CT imaging of the chest was performed following the standard protocol without IV contrast. COMPARISON:  CT chest 06/26/2019 FINDINGS: Cardiovascular: The heart size appears normal. Aortic atherosclerosis identified. Increase caliber of the main pulmonary artery measuring 3.5 cm suggestive of chronic pulmonary arterial hypertension. Three vessel coronary artery calcifications identified. Mediastinum/Nodes: Normal appearance of the thyroid gland. The trachea appears patent and is midline. Normal appearance of the esophagus. Right paratracheal lymph node measures 1.1 cm, image 64/2. Similar to previous exam. No axillary or supraclavicular adenopathy. Lungs/Pleura: Increase in volume of left pleural effusion. Interval development of extensive bilateral areas of ground-glass attenuation throughout both lungs, most severely in the right upper lobe. Persistent  wedge-shaped and peripheral areas of subpleural consolidation within the right lower lobe likely reflecting pulmonary infarct. Within the left upper lobe there is a new geographic area of peripheral ground-glass attenuation, airspace consolidation and internal cavitation likely reflecting pulmonary infarct, image 26/4. Within the anterolateral subpleural aspect of the left lower lobe there is similar area of ground-glass attenuation and progressive cavitation concerning for evolving changes of pulmonary infarct. Solid spiculated mass within the right lung apex is again noted and appears unchanged measuring 2.7 cm, image 30/4. This is suspicious for pulmonary neoplasm Upper Abdomen: No acute abnormality. Musculoskeletal: No acute abnormality identified. Similar appearance of increased sclerosis associated with T3, T4 and T5. IMPRESSION: 1. Interval development of extensive bilateral areas of ground-glass attenuation, which may represent areas of pulmonary edema or alveolar hemorrhage secondary to infarct. New geographic area of ground-glass attenuation, mild airspace consolidation and internal cavitation within the left upper lobe concerning for infarct. Persistent and slightly progressive area of ground-glass attenuation and cavitation is noted in the left lower lobe also worrisome for sequelae of pulmonary infarct. 2. Stable appearance of spiculated mass within the right lung apex which is suspicious for primary bronchogenic carcinoma. 3. Increase in volume of left pleural effusion. 4. Three vessel coronary artery calcifications noted. 5. Increase caliber of the main pulmonary artery suggestive of chronic pulmonary arterial hypertension. 6. Areas increased sclerosis within the upper thoracic vertebra noted. Primary differential considerations include metastatic disease versus discogenic sclerosis. Aortic Atherosclerosis (ICD10-I70.0). Electronically Signed   By: Kerby Moors M.D.   On: 07/03/2019 11:28   Dg  Chest Port 1 View  Result Date: 07/02/2019 CLINICAL DATA:  Acute onset of chest pain and shortness of breath. COPD. Acute pulmonary embolism. EXAM: PORTABLE CHEST 1 VIEW COMPARISON:  06/26/2019 FINDINGS: Heart size remains normal. Peripheral infiltrate and pleural thickening in the lateral left lung base shows no significant change. Mild right upper lobe airspace disease has increased since previous study. New nodular opacity with central lucency possibly due to cavitation in the left lung apex. Persistent ill-defined pulmonary nodular opacity seen in the right lung apex without significant change. IMPRESSION: No significant change in peripheral infiltrate and pleural thickening in the lateral left lung base and ill-defined right atypical pulmonary nodule. New nodular opacity with central lucency in the left lung apex, suspicious for cavitation. Differential diagnosis includes pulmonary infarcts, pneumonia, and neoplasm. Electronically Signed  By: Marlaine Hind M.D.   On: 07/02/2019 19:30        Scheduled Meds:  acetylcysteine  2 mL Nebulization TID   albuterol  8 puff Inhalation Once   apixaban  10 mg Oral BID   Followed by   Derrill Memo ON 07/09/2019] apixaban  5 mg Oral BID   budesonide (PULMICORT) nebulizer solution  0.5 mg Nebulization BID   Chlorhexidine Gluconate Cloth  6 each Topical Daily   feeding supplement (GLUCERNA SHAKE)  237 mL Oral TID BM   guaiFENesin  1,200 mg Oral BID   insulin aspart  0-20 Units Subcutaneous TID WC   insulin aspart  0-5 Units Subcutaneous QHS   insulin aspart  10 Units Subcutaneous TID WC   insulin detemir  30 Units Subcutaneous Daily   ipratropium  0.5 mg Nebulization Q6H   methylPREDNISolone (SOLU-MEDROL) injection  40 mg Intravenous Q12H   varenicline  0.5 mg Oral BID   Continuous Infusions:  ampicillin-sulbactam (UNASYN) IV 3 g (07/04/19 0248)     LOS: 8 days    Time spent: 30 minutes    Jebidiah Baggerly Darleen Crocker, DO [facility] Pager [phone]  If 7PM-7AM, please contact night-coverage [email or web address] Password [facility] 07/04/2019, 7:24 AM

## 2019-07-04 NOTE — Progress Notes (Signed)
Offered to place pt on BIPAP or HHFNC for his complaint of difficulty breathing but pt refused. Pt only wants to wear NRB. Pt remains on NRB

## 2019-07-04 NOTE — Progress Notes (Signed)
Physical Therapy Treatment Patient Details Name: Craig Owens MRN: [id number] DOB: 10-22-1960 Today's Date: 07/04/2019    History of Present Illness Craig Owens is a 58 y.o. male with medical history of COPD, tobacco abuse, nonobstructive CAD, THC use, remote stroke presenting with acute onset of shortness of breath and chest discomfort approximately 2 hours prior to admission.  The patient denies any fevers, chills, headache, neck pain, nausea, vomiting, diarrhea, abdominal pain.  He has had a small amount of hemoptysis for 1 day.  He denies any hematemesis, hematochezia, melena.Notably, the patient states that he was told that he had "lung cancer" approximately 5 years ago by his primary care provider, but the patient was lost to follow-up and never followed through with work-up.Nevertheless, the patient continued to smoke approximately 2 packs/day for the last 40 years.  He uses THC but denies any other illegal drug use.  Upon EMS arrival, the patient was noted to be hypoxic with oxygen saturation of 80% on 6 L.  The patient was subsequently placed on nonrebreather in the emergency department with oxygen saturations 96-98%.In the emergency department, the patient was afebrile, tachycardic with HR 110-120, and hypoxic requiring a nonrebreather with oxygen saturation 98%.  CT angiogram chest showed pulmonary embolus in the bilateral distal main pulmonary artery with extension into the upper lobes and lower lobes bilateral.  There was RV strain.    PT Comments    Patient limited to a few steps at bedside to c/o fatigue and difficulty breathing, on high flow O2, demonstrates good return for completing BLE rom/strengthening exercises while seated in chair and tolerated staying up in chair after therapy.  Patient will benefit from continued physical therapy in hospital and recommended venue below to increase strength, balance, endurance for safe ADLs and gait.    Follow Up Recommendations  Home  health PT;Supervision - Intermittent     Equipment Recommendations  None recommended by PT    Recommendations for Other Services       Precautions / Restrictions Precautions Precautions: Fall Restrictions Weight Bearing Restrictions: No    Mobility  Bed Mobility Overal bed mobility: Needs Assistance Bed Mobility: Supine to Sit     Supine to sit: HOB elevated;Min guard     General bed mobility comments: increased time, labored movement, head of bed raised and had to use cane to pull self up  Transfers Overall transfer level: Needs assistance Equipment used: Straight cane Transfers: Sit to/from Stand;Stand Pivot Transfers Sit to Stand: Supervision Stand pivot transfers: Supervision       General transfer comment: increased time, labored movement  Ambulation/Gait Ambulation/Gait assistance: Min guard Gait Distance (Feet): 3 Feet Assistive device: Straight cane Gait Pattern/deviations: Decreased step length - right;Decreased step length - left;Decreased stride length Gait velocity: decreased   General Gait Details: limited to 3-4 steps at bedside with labored movement due to c/o fatigue and on high flow O2 with breather   Stairs             Wheelchair Mobility    Modified Rankin (Stroke Patients Only)       Balance Overall balance assessment: Needs assistance Sitting-balance support: Feet supported;No upper extremity supported Sitting balance-Leahy Scale: Good Sitting balance - Comments: seated at EOB   Standing balance support: During functional activity;Single extremity supported Standing balance-Leahy Scale: Fair Standing balance comment: using SPC  Cognition Arousal/Alertness: Awake/alert Behavior During Therapy: WFL for tasks assessed/performed Overall Cognitive Status: Within Functional Limits for tasks assessed                                        Exercises General Exercises -  Lower Extremity Long Arc Quad: Seated;AROM;Strengthening;Both;10 reps Hip Flexion/Marching: Seated;AROM;Strengthening;Both;10 reps Toe Raises: Seated;AROM;Strengthening;10 reps;Both Heel Raises: Seated;AROM;Strengthening;10 reps;Both    General Comments        Pertinent Vitals/Pain Pain Assessment: Faces Faces Pain Scale: Hurts a little bit Pain Location: left side lower back Pain Descriptors / Indicators: Sore    Home Living                      Prior Function            PT Goals (current goals can now be found in the care plan section) Acute Rehab PT Goals Patient Stated Goal: return home PT Goal Formulation: With patient Time For Goal Achievement: 07/09/19 Potential to Achieve Goals: Good Progress towards PT goals: Progressing toward goals    Frequency    Min 3X/week      PT Plan Current plan remains appropriate    Co-evaluation              AM-PAC PT "6 Clicks" Mobility   Outcome Measure  Help needed turning from your back to your side while in a flat bed without using bedrails?: None Help needed moving from lying on your back to sitting on the side of a flat bed without using bedrails?: None Help needed moving to and from a bed to a chair (including a wheelchair)?: A Little Help needed standing up from a chair using your arms (e.g., wheelchair or bedside chair)?: None Help needed to walk in hospital room?: A Little Help needed climbing 3-5 steps with a railing? : A Lot 6 Click Score: 20    End of Session Equipment Utilized During Treatment: Oxygen Activity Tolerance: Patient tolerated treatment well;Patient limited by fatigue Patient left: in chair;with call bell/phone within reach Nurse Communication: Mobility status PT Visit Diagnosis: Unsteadiness on feet (R26.81);Other abnormalities of gait and mobility (R26.89)     Time: 1140-1206 PT Time Calculation (min) (ACUTE ONLY): 26 min  Charges:  $Therapeutic Exercise: 8-22  mins $Therapeutic Activity: 8-22 mins                     12:36 PM, 07/04/19 Lonell Grandchild, MPT Physical Therapist with [facility] [phone] office [phone] mobile phone

## 2019-07-04 NOTE — Progress Notes (Signed)
Subjective: He says he still feels bad.  He has been coughing up some blood.  He did not use BiPAP and says that he cannot tolerate that.  He is on high flow oxygen with adequate oxygenation.  He says he has some chest discomfort when he coughs.  CT done yesterday shows extensive changes with areas of presumed carcinoma and areas of presumed pulmonary infarction.  Objective: Vital signs in last 24 hours: Temp:  [97.6 F (36.4 C)-98.4 F (36.9 C)] 97.6 F (36.4 C) (10/21 0400) Pulse Rate:  [90-100] 95 (10/21 0500) Resp:  [24-43] 30 (10/21 0600) BP: (121-142)/(68-85) 137/68 (10/21 0600) SpO2:  [93 %-100 %] 93 % (10/21 0500) Weight change:  Last BM Date: 06/30/19  Intake/Output from previous day: 10/20 0701 - 10/21 0700 In: 200 [IV Piggyback:200] Out: 2100 [Urine:2100]  PHYSICAL EXAM General appearance: alert and mild distress Resp: rhonchi bilaterally Cardio: regular rate and rhythm, S1, S2 normal, no murmur, click, rub or gallop GI: soft, non-tender; bowel sounds normal; no masses,  no organomegaly Extremities: extremities normal, atraumatic, no cyanosis or edema  Lab Results:  Results for orders placed or performed during the hospital encounter of 06/26/19 (from the past 48 hour(s))  Glucose, capillary     Status: Abnormal   Collection Time: 07/02/19  7:40 AM  Result Value Ref Range   Glucose-Capillary 310 (H) 70 - 99 mg/dL  Glucose, capillary     Status: Abnormal   Collection Time: 07/02/19 11:19 AM  Result Value Ref Range   Glucose-Capillary 422 (H) 70 - 99 mg/dL  Glucose, capillary     Status: Abnormal   Collection Time: 07/02/19  4:20 PM  Result Value Ref Range   Glucose-Capillary 263 (H) 70 - 99 mg/dL  Blood gas, arterial     Status: Abnormal   Collection Time: 07/02/19  7:30 PM  Result Value Ref Range   FIO2 36.00    Delivery systems NASAL CANNULA    pH, Arterial 7.477 (H) 7.350 - 7.450   pCO2 arterial 35.7 32.0 - 48.0 mmHg   pO2, Arterial 46.4 (L) 83.0 - 108.0  mmHg   Bicarbonate 26.8 20.0 - 28.0 mmol/L   Acid-Base Excess 2.7 (H) 0.0 - 2.0 mmol/L   O2 Saturation 80.8 %   Patient temperature 37.0    Collection site RIGHT RADIAL    Drawn by [id number]    Allens test (pass/fail) PASS PASS    Comment: Performed at [facility], [address] [place], [place] [address]  Glucose, capillary     Status: Abnormal   Collection Time: 07/02/19  8:25 PM  Result Value Ref Range   Glucose-Capillary 304 (H) 70 - 99 mg/dL   Comment 1 Notify RN    Comment 2 Document in Chart   Basic metabolic panel     Status: Abnormal   Collection Time: 07/03/19  4:13 AM  Result Value Ref Range   Sodium 137 135 - 145 mmol/L   Potassium 4.9 3.5 - 5.1 mmol/L   Chloride 102 98 - 111 mmol/L   CO2 24 22 - 32 mmol/L   Glucose, Bld 292 (H) 70 - 99 mg/dL   BUN 35 (H) 6 - 20 mg/dL   Creatinine, Ser 0.70 0.61 - 1.24 mg/dL   Calcium 9.3 8.9 - 10.3 mg/dL   GFR calc non Af Amer >60 >60 mL/min   GFR calc Af Amer >60 >60 mL/min   Anion gap 11 5 - 15    Comment: Performed at [facility], [address] [place], [place] [address]  CBC     Status: Abnormal   Collection Time: 07/03/19  4:13 AM  Result Value Ref Range   WBC 31.4 (H) 4.0 - 10.5 K/uL   RBC 5.45 4.22 - 5.81 MIL/uL   Hemoglobin 14.8 13.0 - 17.0 g/dL   HCT 47.1 39.0 - 52.0 %   MCV 86.4 80.0 - 100.0 fL   MCH 27.2 26.0 - 34.0 pg   MCHC 31.4 30.0 - 36.0 g/dL   RDW 14.4 11.5 - 15.5 %   Platelets 212 150 - 400 K/uL   nRBC 0.0 0.0 - 0.2 %    Comment: Performed at [facility], [address] [place], [place] [address]  Glucose, capillary     Status: Abnormal   Collection Time: 07/03/19  7:16 AM  Result Value Ref Range   Glucose-Capillary 264 (H) 70 - 99 mg/dL  Glucose, capillary     Status: Abnormal   Collection Time: 07/03/19 11:27 AM  Result Value Ref Range   Glucose-Capillary 240 (H) 70 - 99 mg/dL  Glucose, capillary     Status: Abnormal   Collection Time: 07/03/19  4:05 PM  Result Value Ref Range    Glucose-Capillary 264 (H) 70 - 99 mg/dL  Glucose, capillary     Status: Abnormal   Collection Time: 07/03/19  9:05 PM  Result Value Ref Range   Glucose-Capillary 163 (H) 70 - 99 mg/dL   Comment 1 Notify RN    Comment 2 Document in Chart   Basic metabolic panel     Status: Abnormal   Collection Time: 07/04/19  3:52 AM  Result Value Ref Range   Sodium 135 135 - 145 mmol/L   Potassium 4.4 3.5 - 5.1 mmol/L   Chloride 100 98 - 111 mmol/L   CO2 25 22 - 32 mmol/L   Glucose, Bld 221 (H) 70 - 99 mg/dL   BUN 35 (H) 6 - 20 mg/dL   Creatinine, Ser 0.65 0.61 - 1.24 mg/dL   Calcium 9.1 8.9 - 10.3 mg/dL   GFR calc non Af Amer >60 >60 mL/min   GFR calc Af Amer >60 >60 mL/min   Anion gap 10 5 - 15    Comment: Performed at [facility], [address], [place], [place] [address]  CBC     Status: Abnormal   Collection Time: 07/04/19  3:52 AM  Result Value Ref Range   WBC 24.9 (H) 4.0 - 10.5 K/uL   RBC 5.37 4.22 - 5.81 MIL/uL   Hemoglobin 14.4 13.0 - 17.0 g/dL   HCT 46.4 39.0 - 52.0 %   MCV 86.4 80.0 - 100.0 fL   MCH 26.8 26.0 - 34.0 pg   MCHC 31.0 30.0 - 36.0 g/dL   RDW 14.6 11.5 - 15.5 %   Platelets 163 150 - 400 K/uL   nRBC 0.0 0.0 - 0.2 %    Comment: Performed at [facility], [address] [place], [place] [address]    ABGS Recent Labs    07/02/19 1930  PHART 7.477*  PO2ART 46.4*  HCO3 26.8   CULTURES Recent Results (from the past 240 hour(s))  SARS Coronavirus 2 by RT PCR (hospital order, performed in [facility] hospital lab) Nasopharyngeal Nasopharyngeal Swab     Status: None   Collection Time: 06/26/19 11:06 AM   Specimen: Nasopharyngeal Swab  Result Value Ref Range Status   SARS Coronavirus 2 NEGATIVE NEGATIVE Final    Comment: (NOTE) If result  is NEGATIVE SARS-CoV-2 target nucleic acids are NOT DETECTED. The SARS-CoV-2 RNA is generally detectable in upper and lower  respiratory specimens during the acute phase of infection. The lowest  concentration of SARS-CoV-2  viral copies this assay can detect is 250  copies / mL. A negative result does not preclude SARS-CoV-2 infection  and should not be used as the sole basis for treatment or other  patient management decisions.  A negative result may occur with  improper specimen collection / handling, submission of specimen other  than nasopharyngeal swab, presence of viral mutation(s) within the  areas targeted by this assay, and inadequate number of viral copies  (<250 copies / mL). A negative result must be combined with clinical  observations, patient history, and epidemiological information. If result is POSITIVE SARS-CoV-2 target nucleic acids are DETECTED. The SARS-CoV-2 RNA is generally detectable in upper and lower  respiratory specimens dur ing the acute phase of infection.  Positive  results are indicative of active infection with SARS-CoV-2.  Clinical  correlation with patient history and other diagnostic information is  necessary to determine patient infection status.  Positive results do  not rule out bacterial infection or co-infection with other viruses. If result is PRESUMPTIVE POSTIVE SARS-CoV-2 nucleic acids MAY BE PRESENT.   A presumptive positive result was obtained on the submitted specimen  and confirmed on repeat testing.  While 2019 novel coronavirus  (SARS-CoV-2) nucleic acids may be present in the submitted sample  additional confirmatory testing may be necessary for epidemiological  and / or clinical management purposes  to differentiate between  SARS-CoV-2 and other Sarbecovirus currently known to infect humans.  If clinically indicated additional testing with an alternate test  methodology [phone]) is advised. The SARS-CoV-2 RNA is generally  detectable in upper and lower respiratory sp ecimens during the acute  phase of infection. The expected result is Negative. Fact Sheet for Patients:  StrictlyIdeas.no Fact Sheet for Healthcare  Providers: BankingDealers.co.za This test is not yet approved or cleared by the [place] FDA and has been authorized for detection and/or diagnosis of SARS-CoV-2 by FDA under an Emergency Use Authorization (EUA).  This EUA will remain in effect (meaning this test can be used) for the duration of the COVID-19 declaration under Section 564(b)(1) of the Act, 21 U.S.C. section 360bbb-3(b)(1), unless the authorization is terminated or revoked sooner. Performed at [facility], [address], [place], [place] [address]   Blood Culture (routine x 2)     Status: None   Collection Time: 06/26/19 11:21 AM   Specimen: BLOOD RIGHT ARM  Result Value Ref Range Status   Specimen Description BLOOD RIGHT ARM  Final   Special Requests   Final    BOTTLES DRAWN AEROBIC AND ANAEROBIC Blood Culture adequate volume   Culture   Final    NO GROWTH 6 DAYS Performed at [facility], [address] [place], [place] [address]    Report Status 07/02/2019 FINAL  Final  Blood Culture (routine x 2)     Status: None   Collection Time: 06/26/19 11:32 AM   Specimen: BLOOD RIGHT FOREARM  Result Value Ref Range Status   Specimen Description BLOOD RIGHT FOREARM  Final   Special Requests   Final    BOTTLES DRAWN AEROBIC AND ANAEROBIC Blood Culture adequate volume   Culture   Final    NO GROWTH 6 DAYS Performed at [facility], [address] [place], [place] [address]    Report Status 07/02/2019 FINAL  Final  MRSA PCR Screening     Status: None   Collection Time: 06/26/19  6:15 PM   Specimen: Nasal Mucosa; Nasopharyngeal  Result Value Ref Range Status   MRSA by PCR NEGATIVE NEGATIVE Final    Comment:        The GeneXpert MRSA Assay (FDA approved for NASAL specimens only), is one component of a comprehensive MRSA colonization surveillance program. It is not intended to diagnose MRSA infection nor to guide or monitor treatment for MRSA infections. Performed at [facility],  [address] [place], [place] [address]    Studies/Results: Ct Chest Wo Contrast  Result Date: 07/03/2019 CLINICAL DATA:  Follow-up abnormal chest radiograph recent pulmonary embolus. EXAM: CT CHEST WITHOUT CONTRAST TECHNIQUE: Multidetector CT imaging of the chest was performed following the standard protocol without IV contrast. COMPARISON:  CT chest 06/26/2019 FINDINGS: Cardiovascular: The heart size appears normal. Aortic atherosclerosis identified. Increase caliber of the main pulmonary artery measuring 3.5 cm suggestive of chronic pulmonary arterial hypertension. Three vessel coronary artery calcifications identified. Mediastinum/Nodes: Normal appearance of the thyroid gland. The trachea appears patent and is midline. Normal appearance of the esophagus. Right paratracheal lymph node measures 1.1 cm, image 64/2. Similar to previous exam. No axillary or supraclavicular adenopathy. Lungs/Pleura: Increase in volume of left pleural effusion. Interval development of extensive bilateral areas of ground-glass attenuation throughout both lungs, most severely in the right upper lobe. Persistent wedge-shaped and peripheral areas of subpleural consolidation within the right lower lobe likely reflecting pulmonary infarct. Within the left upper lobe there is a new geographic area of peripheral ground-glass attenuation, airspace consolidation and internal cavitation likely reflecting pulmonary infarct, image 26/4. Within the anterolateral subpleural aspect of the left lower lobe there is similar area of ground-glass attenuation and progressive cavitation concerning for evolving changes of pulmonary infarct. Solid spiculated mass within the right lung apex is again noted and appears unchanged measuring 2.7 cm, image 30/4. This is suspicious for pulmonary neoplasm Upper Abdomen: No acute abnormality. Musculoskeletal: No acute abnormality identified. Similar appearance of increased sclerosis associated with T3, T4 and T5.  IMPRESSION: 1. Interval development of extensive bilateral areas of ground-glass attenuation, which may represent areas of pulmonary edema or alveolar hemorrhage secondary to infarct. New geographic area of ground-glass attenuation, mild airspace consolidation and internal cavitation within the left upper lobe concerning for infarct. Persistent and slightly progressive area of ground-glass attenuation and cavitation is noted in the left lower lobe also worrisome for sequelae of pulmonary infarct. 2. Stable appearance of spiculated mass within the right lung apex which is suspicious for primary bronchogenic carcinoma. 3. Increase in volume of left pleural effusion. 4. Three vessel coronary artery calcifications noted. 5. Increase caliber of the main pulmonary artery suggestive of chronic pulmonary arterial hypertension. 6. Areas increased sclerosis within the upper thoracic vertebra noted. Primary differential considerations include metastatic disease versus discogenic sclerosis. Aortic Atherosclerosis (ICD10-I70.0). Electronically Signed   By: Kerby Moors M.D.   On: 07/03/2019 11:28   Dg Chest Port 1 View  Result Date: 07/02/2019 CLINICAL DATA:  Acute onset of chest pain and shortness of breath. COPD. Acute pulmonary embolism. EXAM: PORTABLE CHEST 1 VIEW COMPARISON:  06/26/2019 FINDINGS: Heart size remains normal. Peripheral infiltrate and pleural thickening in the lateral left lung base shows no significant change. Mild right upper lobe airspace disease has increased since previous study. New nodular opacity with central lucency possibly due to cavitation in the left lung apex. Persistent ill-defined pulmonary nodular opacity seen in the  right lung apex without significant change. IMPRESSION: No significant change in peripheral infiltrate and pleural thickening in the lateral left lung base and ill-defined right atypical pulmonary nodule. New nodular opacity with central lucency in the left lung apex,  suspicious for cavitation. Differential diagnosis includes pulmonary infarcts, pneumonia, and neoplasm. Electronically Signed   By: Marlaine Hind M.D.   On: 07/02/2019 19:30    Medications:  Prior to Admission:  Medications Prior to Admission  Medication Sig Dispense Refill Last Dose  . albuterol (PROVENTIL HFA;VENTOLIN HFA) 108 (90 BASE) MCG/ACT inhaler Inhale 2 puffs into the lungs 4 (four) times daily.    06/25/2019 at Unknown time  . aspirin 81 MG chewable tablet Chew 81 mg by mouth daily.   06/25/2019 at Unknown time  . CHANTIX 0.5 MG tablet Take 0.5 mg by mouth 2 (two) times daily.    06/25/2019 at Unknown time  . clopidogrel (PLAVIX) 75 MG tablet Take 75 mg by mouth at bedtime.    06/25/2019 at Unknown time  . INVOKANA 300 MG TABS tablet Take 300 mg by mouth daily.    06/25/2019 at Unknown time  . levofloxacin (LEVAQUIN) 750 MG tablet Take 750 mg by mouth daily. 7 day course starting on 06/21/2019   06/25/2019 at Unknown time  . oxyCODONE-acetaminophen (PERCOCET) 10-325 MG tablet Take 1 tablet by mouth every 6 (six) hours as needed for pain. (Patient taking differently: Take 1 tablet by mouth 4 (four) times daily. ) 150 tablet 0 06/25/2019 at Unknown time  . polyethylene glycol powder (GLYCOLAX/MIRALAX) powder Take 17 g by mouth daily as needed (For constipation.).    06/25/2019 at Unknown time  . pravastatin (PRAVACHOL) 40 MG tablet Take 40 mg by mouth daily.   06/25/2019 at Unknown time  . predniSONE (DELTASONE) 20 MG tablet Take 20 mg by mouth 2 (two) times daily. 21 day course starting 06/21/2019   06/25/2019 at Unknown time  . TRULICITY 3.29 JM/4.2AS SOPN Inject 0.5 mLs into the skin once a week.    06/25/2019 at Unknown time   Scheduled: . acetylcysteine  2 mL Nebulization TID  . albuterol  8 puff Inhalation Once  . apixaban  10 mg Oral BID   Followed by  . [START ON 07/09/2019] apixaban  5 mg Oral BID  . budesonide (PULMICORT) nebulizer solution  0.5 mg Nebulization BID  .  Chlorhexidine Gluconate Cloth  6 each Topical Daily  . feeding supplement (GLUCERNA SHAKE)  237 mL Oral TID BM  . guaiFENesin  1,200 mg Oral BID  . insulin aspart  0-20 Units Subcutaneous TID WC  . insulin aspart  0-5 Units Subcutaneous QHS  . insulin aspart  10 Units Subcutaneous TID WC  . insulin detemir  30 Units Subcutaneous Daily  . ipratropium  0.5 mg Nebulization Q6H  . methylPREDNISolone (SOLU-MEDROL) injection  40 mg Intravenous Q12H  . varenicline  0.5 mg Oral BID   Continuous: . ampicillin-sulbactam (UNASYN) IV 3 g (07/04/19 0248)   TMH:[id number] **OR** acetaminophen, ALPRAZolam, levalbuterol, ondansetron **OR** ondansetron (ZOFRAN) IV, oxyCODONE-acetaminophen **AND** oxyCODONE, polyethylene glycol  Assesment: He was admitted with acute pulmonary emboli.  It was submassive with right ventricular strain.  It appears that he has several areas of pulmonary infarction.  This is being treated with IV antibiotics.  He has acute hypoxic respiratory failure and has required BiPAP but refuses that now  At baseline he has COPD with an approximately 80-pack-year smoking history and that is being treated with steroids inhaled bronchodilators.  He had elevated troponin on admission was thought to be related to demand ischemia  He has diabetes and his sugar has been up some likely from acute illness and steroids  About 5 years ago he was told that he had lung cancer by his primary care physician but there is not been any follow-up and he never followed through with any sort of work-up. Principal Problem:   Acute pulmonary embolus (HCC) Active Problems:   Tobacco abuse   Acute respiratory failure with hypoxia (HCC)   COPD with acute exacerbation ([place])   Uncontrolled type 2 diabetes mellitus with hyperglycemia ([place])   Palliative care by specialist   Goals of care, counseling/discussion   Anxiety state   Lung cancer ([place])   PNA (pneumonia)   Chronic pain syndrome   Demand  ischemia ([place])    Plan: Continue treatments including IV antibiotics IV steroids.    LOS: 8 days   Craig Owens 07/04/2019, 7:31 AM

## 2019-07-04 NOTE — Care Management Important Message (Signed)
Important Message  Patient Details  Name: Craig Owens MRN: [id number] Date of Birth: 1961-05-12   Medicare Important Message Given:  Yes     [name] 07/04/2019, 3:28 PM

## 2019-07-05 LAB — GLUCOSE, CAPILLARY
Glucose-Capillary: 202 mg/dL — ABNORMAL HIGH (ref 70–99)
Glucose-Capillary: 338 mg/dL — ABNORMAL HIGH (ref 70–99)
Glucose-Capillary: 351 mg/dL — ABNORMAL HIGH (ref 70–99)
Glucose-Capillary: 293 mg/dL — ABNORMAL HIGH (ref 70–99)

## 2019-07-05 LAB — CBC
HCT: 44 % (ref 39.0–52.0)
Hemoglobin: 13.6 g/dL (ref 13.0–17.0)
MCH: 26.8 pg (ref 26.0–34.0)
MCHC: 30.9 g/dL (ref 30.0–36.0)
MCV: 86.6 fL (ref 80.0–100.0)
Platelets: 153 10*3/uL (ref 150–400)
RBC: 5.08 MIL/uL (ref 4.22–5.81)
RDW: 14.5 % (ref 11.5–15.5)
WBC: 33.4 10*3/uL — ABNORMAL HIGH (ref 4.0–10.5)
nRBC: 0 % (ref 0.0–0.2)

## 2019-07-05 LAB — BASIC METABOLIC PANEL
Anion gap: 9 (ref 5–15)
BUN: 33 mg/dL — ABNORMAL HIGH (ref 6–20)
CO2: 25 mmol/L (ref 22–32)
Calcium: 9.1 mg/dL (ref 8.9–10.3)
Chloride: 101 mmol/L (ref 98–111)
Creatinine, Ser: 0.61 mg/dL (ref 0.61–1.24)
GFR calc Af Amer: >60 mL/min (ref >60)
GFR calc non Af Amer: >60 mL/min (ref >60)
Glucose, Bld: 251 mg/dL — ABNORMAL HIGH (ref 70–99)
Potassium: 4.7 mmol/L (ref 3.5–5.1)
Sodium: 135 mmol/L (ref 135–145)

## 2019-07-05 MED ORDER — IPRATROPIUM-ALBUTEROL 0.5-2.5 (3) MG/3ML IN SOLN
3.0000 mL | Freq: Three times a day (TID) | RESPIRATORY_TRACT | Status: DC
  Administered 2019-07-05 – 2019-07-10 (×15): 3 mL via RESPIRATORY_TRACT
  Filled 2019-07-05 (×13): qty 3

## 2019-07-05 MED ORDER — ALBUTEROL SULFATE (2.5 MG/3ML) 0.083% IN NEBU
2.5000 mg | INHALATION_SOLUTION | Freq: Three times a day (TID) | RESPIRATORY_TRACT | Status: DC
  Administered 2019-07-05: 2.5 mg via RESPIRATORY_TRACT

## 2019-07-05 NOTE — Progress Notes (Signed)
PROGRESS NOTE    Craig Owens  FHL:[id number] DOB: [birth year] DOA: 06/26/2019 PCP: Lucia Gaskins, MD   Brief Narrative:  Per HPI: Craig Owens a 58 y.o.malewith medical history ofCOPD, tobacco abuse, nonobstructive CAD, THC use, remote stroke presenting with acute onset of shortness of breath and chest discomfort approximately 2 hours prior to admission. The patient denies any fevers, chills, headache, neck pain, nausea, vomiting, diarrhea, abdominal pain. He has had a small amount of hemoptysis for 1 day. He denies any hematemesis, hematochezia, melena. Notably, the patient states that he was told that he had "lung cancer" approximately 5 years ago by his primary care provider, but the patient was lost to follow-up and never followed through with work-up. Nevertheless, the patient continued to smoke approximately 2 packs/day for the last 40 years. He uses THC but denies any other illegal drug use. Upon EMS arrival, the patient was noted to be hypoxic with oxygen saturation of 80% on 6 L. The patient was subsequently placed on nonrebreather in the emergency department with oxygen saturations 96-98%. In the emergency department, the patient was afebrile, tachycardicwith HR 110-120, and hypoxic requiring a nonrebreather with oxygen saturation 98%. CT angiogram chest showed pulmonary embolus in the bilateral distal main pulmonary artery with extension into the upper lobes and lower lobes bilateral. There was RV strain.  10/14:Patient appears to be doing well this morning with stable vital signs noted. He is actually on room air saturating near 100%. He denies any other symptoms and 2D echocardiogram is pending. We will plan to increase insulin coverage and wean steroids. Palliative consulted for goals of care discussion.  10/15:Patient continues to do well and has some minimal anxiety with hemoptysis this morning. 2D echocardiogram with LVEF 55-60%. Palliative  consultation pending. Plan to transfer to telemetry.  10/16:Patient continues to have excessive sputum output and appears congested. He is only minimally anxious, but likes to stay on his nonrebreather mask. No other acute events otherwise noted. Blood glucose levels are more stable with insulin adjustments yesterday. We will plan to add chest physiotherapy Mucomyst today.  10/19:Patient has completed course of IV heparin over the course of the weekend and will transition to Eliquis today. Plan for PT evaluation and weaning of oxygen as well as steroids. Continue IV antibiotics for 2 more days. Continue blood glucose control.  10/20:Patient has struggled on and off with use of BiPAP overnight and wants to remain on nonrebreather mask. Consulted pulmonology with plans for repeat CT chest given some new cavitary lesion seen on chest x-ray. Patient will be started on IV Unasyn with repeat CT chest. Continue Eliquis. Increase Lantus dose to 30 units daily.  10/21: Patient has refused wearing BiPAP last night and prefers nonrebreather.  CT chest with findings of groundglass changes throughout with confirmation of new cavitary lesion seen in the left upper lobe.  Patient remains on IV Unasyn as well as steroids.  Adequate blood glucose control noted today.  10/22: Patient continues to refuse to wear BiPAP and prefers nonrebreather.  Worsening leukocytosis noted this morning.  He continues to demonstrate a stable clinical course on IV Unasyn.  Assessment & Plan:   Principal Problem:   Acute pulmonary embolus (HCC) Active Problems:   Tobacco abuse   Acute respiratory failure with hypoxia (HCC)   COPD with acute exacerbation ([place])   Uncontrolled type 2 diabetes mellitus with hyperglycemia ([place])   Palliative care by specialist   Goals of care, counseling/discussion   Anxiety state   Lung  cancer ([place])   PNA (pneumonia)   Chronic pain syndrome   Demand ischemia (HCC)   Acute  hypoxemic respiratory failure secondary to submassive PE-persistent -Case discussed with pulmonologyon admissionwho did not feel TPA currently indicated -Discontinue IV heparin today and transition to Eliquis with anticipated lifelong therapy -Continue to monitor closely and likely transfer to telemetrytoday -2D echocardiogramwith LVEF 55-60%, normal RV size, but not well visualized -COVID testing negative -Repeat CT chest with extensive groundglass changes noted in cavitary lesion  Acute COPD exacerbation with possible pneumonia-new cavitary lesions on chest x-ray -Continue IV Unasyn -Continue IV Solu-Medrol -Breathing treatments to be scheduled -Continue Pulmicort -Add Mucomyst as well as flutter valve and chest PT for congestion  Pulmonary opacities/nodules -Suspicious for cavitary lung cancer and and bony metastasis -We will have follow-up with oncology in the near future,patient would like to remain aggressive with care -Patient was apparently told he had lung cancer 5 years ago but never pursued work-up  Elevated troponin secondary to PE with RV strain -2D echocardiogram with LVEF 55-60% without significant RV issues noted -Does not appear to be clinically significant for ACS  Type 2 diabetes with improved hyperglycemia -Hemoglobin A1c 8.1% -Levemir increased to 30 units, plan to wean steroids -Continue SSI and carb modified diet and follow carefully  Remote CVA 5 years ago -Hold aspirin and Plavix while on heparin drip -Lipid panel with LDL of 53  THC and tobacco abuse -Urine drug screen with no acute findings -Discussed tobacco cessation and will consider nicotine patch as needed  Chronic pain syndrome -Patient currently on Percocet 5/325 as needed for pain control -Queried on PMP aware   DVT prophylaxis:Eliquis Code Status:Full Family Communication:None at bedside Disposition Plan:Continue current treatments.  Appreciate pulmonology  recommendations.   Consultants:  Palliative  Pulmonology  Procedures:  None  Antimicrobials:  Anti-infectives (From admission, onward)   Start     Dose/Rate Route Frequency Ordered Stop   07/03/19 0900  Ampicillin-Sulbactam (UNASYN) 3 g in sodium chloride 0.9 % 100 mL IVPB     3 g 200 mL/hr over 30 Minutes Intravenous Every 6 hours 07/03/19 0808     07/02/19 1115  azithromycin (ZITHROMAX) 500 mg in sodium chloride 0.9 % 250 mL IVPB     500 mg 250 mL/hr over 60 Minutes Intravenous Every 24 hours 07/02/19 0947 07/02/19 1210   07/02/19 1115  cefTRIAXone (ROCEPHIN) 2 g in sodium chloride 0.9 % 100 mL IVPB     2 g 200 mL/hr over 30 Minutes Intravenous Every 24 hours 07/02/19 0947 07/02/19 1056   06/26/19 1115  cefTRIAXone (ROCEPHIN) 2 g in sodium chloride 0.9 % 100 mL IVPB  Status:  Discontinued     2 g 200 mL/hr over 30 Minutes Intravenous Every 24 hours 06/26/19 1105 07/02/19 0947   06/26/19 1115  azithromycin (ZITHROMAX) 500 mg in sodium chloride 0.9 % 250 mL IVPB  Status:  Discontinued     500 mg 250 mL/hr over 60 Minutes Intravenous Every 24 hours 06/26/19 1105 07/02/19 0947       Subjective: Patient seen and evaluated today with no new acute complaints or concerns. No acute concerns or events noted overnight.  He continues to refuse to wear BiPAP and prefers nonrebreather mask.  Denies any new symptoms.  Objective: Vitals:   07/05/19 0200 07/05/19 0400 07/05/19 0500 07/05/19 0600  BP: 127/71 126/70  119/65  Pulse: 82 81  83  Resp: (!) 24 (!) 23  (!) 23  Temp:  (!) 97.5  F (36.4 C)    TempSrc:  Oral    SpO2: 100% 100%  92%  Weight:   99.3 kg   Height:        Intake/Output Summary (Last 24 hours) at 07/05/2019 0729 Last data filed at 07/05/2019 0700 Gross per 24 hour  Intake 240 ml  Output 1950 ml  Net -1710 ml   Filed Weights   07/02/19 0500 07/03/19 0500 07/05/19 0500  Weight: 103.4 kg 102.5 kg 99.3 kg    Examination:  General exam: Appears calm  and comfortable, arousable Respiratory system: Clear to auscultation. Respiratory effort normal.  Nonrebreather mask Cardiovascular system: S1 & S2 heard, RRR. No JVD, murmurs, rubs, gallops or clicks. No pedal edema. Gastrointestinal system: Abdomen is nondistended, soft and nontender. No organomegaly or masses felt. Normal bowel sounds heard. Central nervous system: Alert and awake Extremities: Symmetric 5 x 5 power. Skin: No rashes, lesions or ulcers Psychiatry: Flat affect    Data Reviewed: I have personally reviewed following labs and imaging studies  CBC: Recent Labs  Lab 06/30/19 0459 07/02/19 0353 07/03/19 0413 07/04/19 0352 07/05/19 0422  WBC 16.4* 22.4* 31.4* 24.9* 33.4*  HGB 13.4 13.1 14.8 14.4 13.6  HCT 43.6 43.0 47.1 46.4 44.0  MCV 86.9 86.3 86.4 86.4 86.6  PLT 279 308 212 163 517   Basic Metabolic Panel: Recent Labs  Lab 07/01/19 0437 07/02/19 0353 07/03/19 0413 07/04/19 0352 07/05/19 0422  NA 135 133* 137 135 135  K 4.9 4.9 4.9 4.4 4.7  CL 101 99 102 100 101  CO2 25 24 24 25 25   GLUCOSE 326* 349* 292* 221* 251*  BUN 27* 29* 35* 35* 33*  CREATININE 0.58* 0.71 0.70 0.65 0.61  CALCIUM 9.2 9.1 9.3 9.1 9.1   GFR: Estimated Creatinine Clearance: 121.8 mL/min (by C-G formula based on SCr of 0.61 mg/dL). Liver Function Tests: Recent Labs  Lab 07/02/19 0353  AST 12*  ALT 11  ALKPHOS 61  BILITOT 0.3  PROT 6.3*  ALBUMIN 2.3*   No results for input(s): LIPASE, AMYLASE in the last 168 hours. No results for input(s): AMMONIA in the last 168 hours. Coagulation Profile: No results for input(s): INR, PROTIME in the last 168 hours. Cardiac Enzymes: No results for input(s): CKTOTAL, CKMB, CKMBINDEX, TROPONINI in the last 168 hours. BNP (last 3 results) No results for input(s): PROBNP in the last 8760 hours. HbA1C: No results for input(s): HGBA1C in the last 72 hours. CBG: Recent Labs  Lab 07/03/19 2105 07/04/19 0742 07/04/19 1107 07/04/19 1631  07/04/19 2212  GLUCAP 163* 219* 316* 279* 311*   Lipid Profile: No results for input(s): CHOL, HDL, LDLCALC, TRIG, CHOLHDL, LDLDIRECT in the last 72 hours. Thyroid Function Tests: No results for input(s): TSH, T4TOTAL, FREET4, T3FREE, THYROIDAB in the last 72 hours. Anemia Panel: No results for input(s): VITAMINB12, FOLATE, FERRITIN, TIBC, IRON, RETICCTPCT in the last 72 hours. Sepsis Labs: No results for input(s): PROCALCITON, LATICACIDVEN in the last 168 hours.  Recent Results (from the past 240 hour(s))  SARS Coronavirus 2 by RT PCR (hospital order, performed in [facility] hospital lab) Nasopharyngeal Nasopharyngeal Swab     Status: None   Collection Time: 06/26/19 11:06 AM   Specimen: Nasopharyngeal Swab  Result Value Ref Range Status   SARS Coronavirus 2 NEGATIVE NEGATIVE Final    Comment: (NOTE) If result is NEGATIVE SARS-CoV-2 target nucleic acids are NOT DETECTED. The SARS-CoV-2 RNA is generally detectable in upper and lower  respiratory specimens during the  acute phase of infection. The lowest  concentration of SARS-CoV-2 viral copies this assay can detect is 250  copies / mL. A negative result does not preclude SARS-CoV-2 infection  and should not be used as the sole basis for treatment or other  patient management decisions.  A negative result may occur with  improper specimen collection / handling, submission of specimen other  than nasopharyngeal swab, presence of viral mutation(s) within the  areas targeted by this assay, and inadequate number of viral copies  (<250 copies / mL). A negative result must be combined with clinical  observations, patient history, and epidemiological information. If result is POSITIVE SARS-CoV-2 target nucleic acids are DETECTED. The SARS-CoV-2 RNA is generally detectable in upper and lower  respiratory specimens dur ing the acute phase of infection.  Positive  results are indicative of active infection with SARS-CoV-2.  Clinical    correlation with patient history and other diagnostic information is  necessary to determine patient infection status.  Positive results do  not rule out bacterial infection or co-infection with other viruses. If result is PRESUMPTIVE POSTIVE SARS-CoV-2 nucleic acids MAY BE PRESENT.   A presumptive positive result was obtained on the submitted specimen  and confirmed on repeat testing.  While 2019 novel coronavirus  (SARS-CoV-2) nucleic acids may be present in the submitted sample  additional confirmatory testing may be necessary for epidemiological  and / or clinical management purposes  to differentiate between  SARS-CoV-2 and other Sarbecovirus currently known to infect humans.  If clinically indicated additional testing with an alternate test  methodology [id number]) is advised. The SARS-CoV-2 RNA is generally  detectable in upper and lower respiratory sp ecimens during the acute  phase of infection. The expected result is Negative. Fact Sheet for Patients:  StrictlyIdeas.no Fact Sheet for Healthcare Providers: BankingDealers.co.za This test is not yet approved or cleared by the [place] FDA and has been authorized for detection and/or diagnosis of SARS-CoV-2 by FDA under an Emergency Use Authorization (EUA).  This EUA will remain in effect (meaning this test can be used) for the duration of the COVID-19 declaration under Section 564(b)(1) of the Act, 21 U.S.C. section 360bbb-3(b)(1), unless the authorization is terminated or revoked sooner. Performed at [facility], [address] [place], [place] [address]   Blood Culture (routine x 2)     Status: None   Collection Time: 06/26/19 11:21 AM   Specimen: BLOOD RIGHT ARM  Result Value Ref Range Status   Specimen Description BLOOD RIGHT ARM  Final   Special Requests   Final    BOTTLES DRAWN AEROBIC AND ANAEROBIC Blood Culture adequate volume   Culture   Final    NO GROWTH 6  DAYS Performed at [facility], [address] [place], [place] [address]    Report Status 07/02/2019 FINAL  Final  Blood Culture (routine x 2)     Status: None   Collection Time: 06/26/19 11:32 AM   Specimen: BLOOD RIGHT FOREARM  Result Value Ref Range Status   Specimen Description BLOOD RIGHT FOREARM  Final   Special Requests   Final    BOTTLES DRAWN AEROBIC AND ANAEROBIC Blood Culture adequate volume   Culture   Final    NO GROWTH 6 DAYS Performed at [facility], [address], [place], [place] [address]    Report Status 07/02/2019 FINAL  Final  MRSA PCR Screening     Status: None   Collection Time: 06/26/19  6:15 PM   Specimen: Nasal  Mucosa; Nasopharyngeal  Result Value Ref Range Status   MRSA by PCR NEGATIVE NEGATIVE Final    Comment:        The GeneXpert MRSA Assay (FDA approved for NASAL specimens only), is one component of a comprehensive MRSA colonization surveillance program. It is not intended to diagnose MRSA infection nor to guide or monitor treatment for MRSA infections. Performed at [facility], [address] [place], [place] [address]          Radiology Studies: Ct Chest Wo Contrast  Result Date: 07/03/2019 CLINICAL DATA:  Follow-up abnormal chest radiograph recent pulmonary embolus. EXAM: CT CHEST WITHOUT CONTRAST TECHNIQUE: Multidetector CT imaging of the chest was performed following the standard protocol without IV contrast. COMPARISON:  CT chest 06/26/2019 FINDINGS: Cardiovascular: The heart size appears normal. Aortic atherosclerosis identified. Increase caliber of the main pulmonary artery measuring 3.5 cm suggestive of chronic pulmonary arterial hypertension. Three vessel coronary artery calcifications identified. Mediastinum/Nodes: Normal appearance of the thyroid gland. The trachea appears patent and is midline. Normal appearance of the esophagus. Right paratracheal lymph node measures 1.1 cm, image 64/2. Similar to previous exam. No axillary or  supraclavicular adenopathy. Lungs/Pleura: Increase in volume of left pleural effusion. Interval development of extensive bilateral areas of ground-glass attenuation throughout both lungs, most severely in the right upper lobe. Persistent wedge-shaped and peripheral areas of subpleural consolidation within the right lower lobe likely reflecting pulmonary infarct. Within the left upper lobe there is a new geographic area of peripheral ground-glass attenuation, airspace consolidation and internal cavitation likely reflecting pulmonary infarct, image 26/4. Within the anterolateral subpleural aspect of the left lower lobe there is similar area of ground-glass attenuation and progressive cavitation concerning for evolving changes of pulmonary infarct. Solid spiculated mass within the right lung apex is again noted and appears unchanged measuring 2.7 cm, image 30/4. This is suspicious for pulmonary neoplasm Upper Abdomen: No acute abnormality. Musculoskeletal: No acute abnormality identified. Similar appearance of increased sclerosis associated with T3, T4 and T5. IMPRESSION: 1. Interval development of extensive bilateral areas of ground-glass attenuation, which may represent areas of pulmonary edema or alveolar hemorrhage secondary to infarct. New geographic area of ground-glass attenuation, mild airspace consolidation and internal cavitation within the left upper lobe concerning for infarct. Persistent and slightly progressive area of ground-glass attenuation and cavitation is noted in the left lower lobe also worrisome for sequelae of pulmonary infarct. 2. Stable appearance of spiculated mass within the right lung apex which is suspicious for primary bronchogenic carcinoma. 3. Increase in volume of left pleural effusion. 4. Three vessel coronary artery calcifications noted. 5. Increase caliber of the main pulmonary artery suggestive of chronic pulmonary arterial hypertension. 6. Areas increased sclerosis within the  upper thoracic vertebra noted. Primary differential considerations include metastatic disease versus discogenic sclerosis. Aortic Atherosclerosis (ICD10-I70.0). Electronically Signed   By: Kerby Moors M.D.   On: 07/03/2019 11:28        Scheduled Meds:  acetylcysteine  2 mL Nebulization TID   albuterol  8 puff Inhalation Once   apixaban  10 mg Oral BID   Followed by   Derrill Memo ON 07/09/2019] apixaban  5 mg Oral BID   budesonide (PULMICORT) nebulizer solution  0.5 mg Nebulization BID   Chlorhexidine Gluconate Cloth  6 each Topical Daily   feeding supplement (GLUCERNA SHAKE)  237 mL Oral TID BM   guaiFENesin  1,200 mg Oral BID   insulin aspart  0-20 Units Subcutaneous TID WC   insulin aspart  0-5 Units  Subcutaneous QHS   insulin aspart  10 Units Subcutaneous TID WC   insulin detemir  30 Units Subcutaneous Daily   ipratropium  0.5 mg Nebulization Q6H   methylPREDNISolone (SOLU-MEDROL) injection  40 mg Intravenous Q12H   varenicline  0.5 mg Oral BID   Continuous Infusions:  ampicillin-sulbactam (UNASYN) IV 3 g (07/05/19 0222)     LOS: 9 days    Time spent: 30 minutes    Auden Wettstein Darleen Crocker, DO [facility] Pager [phone]  If 7PM-7AM, please contact night-coverage [email or web address] Password [facility] 07/05/2019, 7:29 AM

## 2019-07-05 NOTE — Progress Notes (Addendum)
Subjective: He has no new complaints.  He is sleeping but arousable.  He says he feels okay.  He has still been on a nonrebreather mask.  No BiPAP as he refuses it.  Objective: Vital signs in last 24 hours: Temp:  [97.1 F (36.2 C)-97.9 F (36.6 C)] 97.7 F (36.5 C) (10/21 1632) Pulse Rate:  [81-103] 83 (10/22 0600) Resp:  [23-36] 23 (10/22 0600) BP: (119-138)/(65-77) 119/65 (10/22 0600) SpO2:  [92 %-100 %] 92 % (10/22 0600) Weight change:  Last BM Date: 06/30/19  Intake/Output from previous day: 10/21 0701 - 10/22 0700 In: 240 [P.O.:240] Out: 600 [Urine:600]  PHYSICAL EXAM General appearance: Sleepy but awakens easily.  He has not been agitated overnight Resp: rhonchi bilaterally Cardio: regular rate and rhythm, S1, S2 normal, no murmur, click, rub or gallop GI: soft, non-tender; bowel sounds normal; no masses,  no organomegaly Extremities: extremities normal, atraumatic, no cyanosis or edema  Lab Results:  Results for orders placed or performed during the hospital encounter of 06/26/19 (from the past 48 hour(s))  Glucose, capillary     Status: Abnormal   Collection Time: 07/03/19  7:16 AM  Result Value Ref Range   Glucose-Capillary 264 (H) 70 - 99 mg/dL  Glucose, capillary     Status: Abnormal   Collection Time: 07/03/19 11:27 AM  Result Value Ref Range   Glucose-Capillary 240 (H) 70 - 99 mg/dL  Glucose, capillary     Status: Abnormal   Collection Time: 07/03/19  4:05 PM  Result Value Ref Range   Glucose-Capillary 264 (H) 70 - 99 mg/dL  Glucose, capillary     Status: Abnormal   Collection Time: 07/03/19  9:05 PM  Result Value Ref Range   Glucose-Capillary 163 (H) 70 - 99 mg/dL   Comment 1 Notify RN    Comment 2 Document in Chart   Basic metabolic panel     Status: Abnormal   Collection Time: 07/04/19  3:52 AM  Result Value Ref Range   Sodium 135 135 - 145 mmol/L   Potassium 4.4 3.5 - 5.1 mmol/L   Chloride 100 98 - 111 mmol/L   CO2 25 22 - 32 mmol/L   Glucose,  Bld 221 (H) 70 - 99 mg/dL   BUN 35 (H) 6 - 20 mg/dL   Creatinine, Ser 0.65 0.61 - 1.24 mg/dL   Calcium 9.1 8.9 - 10.3 mg/dL   GFR calc non Af Amer >60 >60 mL/min   GFR calc Af Amer >60 >60 mL/min   Anion gap 10 5 - 15    Comment: Performed at [facility], [address] [place], [place] [address]  CBC     Status: Abnormal   Collection Time: 07/04/19  3:52 AM  Result Value Ref Range   WBC 24.9 (H) 4.0 - 10.5 K/uL   RBC 5.37 4.22 - 5.81 MIL/uL   Hemoglobin 14.4 13.0 - 17.0 g/dL   HCT 46.4 39.0 - 52.0 %   MCV 86.4 80.0 - 100.0 fL   MCH 26.8 26.0 - 34.0 pg   MCHC 31.0 30.0 - 36.0 g/dL   RDW 14.6 11.5 - 15.5 %   Platelets 163 150 - 400 K/uL   nRBC 0.0 0.0 - 0.2 %    Comment: Performed at [facility], [address] [place], [place] [address]  Glucose, capillary     Status: Abnormal   Collection Time: 07/04/19  7:42 AM  Result Value Ref Range   Glucose-Capillary 219 (H) 70 - 99 mg/dL  Glucose, capillary     Status: Abnormal   Collection Time: 07/04/19 11:07 AM  Result Value Ref Range   Glucose-Capillary 316 (H) 70 - 99 mg/dL  Glucose, capillary     Status: Abnormal   Collection Time: 07/04/19  4:31 PM  Result Value Ref Range   Glucose-Capillary 279 (H) 70 - 99 mg/dL  Glucose, capillary     Status: Abnormal   Collection Time: 07/04/19 10:12 PM  Result Value Ref Range   Glucose-Capillary 311 (H) 70 - 99 mg/dL  Basic metabolic panel     Status: Abnormal   Collection Time: 07/05/19  4:22 AM  Result Value Ref Range   Sodium 135 135 - 145 mmol/L   Potassium 4.7 3.5 - 5.1 mmol/L   Chloride 101 98 - 111 mmol/L   CO2 25 22 - 32 mmol/L   Glucose, Bld 251 (H) 70 - 99 mg/dL   BUN 33 (H) 6 - 20 mg/dL   Creatinine, Ser 0.61 0.61 - 1.24 mg/dL   Calcium 9.1 8.9 - 10.3 mg/dL   GFR calc non Af Amer >60 >60 mL/min   GFR calc Af Amer >60 >60 mL/min   Anion gap 9 5 - 15    Comment: Performed at [facility], [address], [place], [place] [address]  CBC     Status: Abnormal   Collection  Time: 07/05/19  4:22 AM  Result Value Ref Range   WBC 33.4 (H) 4.0 - 10.5 K/uL   RBC 5.08 4.22 - 5.81 MIL/uL   Hemoglobin 13.6 13.0 - 17.0 g/dL   HCT 44.0 39.0 - 52.0 %   MCV 86.6 80.0 - 100.0 fL   MCH 26.8 26.0 - 34.0 pg   MCHC 30.9 30.0 - 36.0 g/dL   RDW 14.5 11.5 - 15.5 %   Platelets 153 150 - 400 K/uL   nRBC 0.0 0.0 - 0.2 %    Comment: Performed at [facility], [address] [place], [place] [address]    ABGS Recent Labs    07/02/19 1930  PHART 7.477*  PO2ART 46.4*  HCO3 26.8   CULTURES Recent Results (from the past 240 hour(s))  SARS Coronavirus 2 by RT PCR (hospital order, performed in [facility] hospital lab) Nasopharyngeal Nasopharyngeal Swab     Status: None   Collection Time: 06/26/19 11:06 AM   Specimen: Nasopharyngeal Swab  Result Value Ref Range Status   SARS Coronavirus 2 NEGATIVE NEGATIVE Final    Comment: (NOTE) If result is NEGATIVE SARS-CoV-2 target nucleic acids are NOT DETECTED. The SARS-CoV-2 RNA is generally detectable in upper and lower  respiratory specimens during the acute phase of infection. The lowest  concentration of SARS-CoV-2 viral copies this assay can detect is 250  copies / mL. A negative result does not preclude SARS-CoV-2 infection  and should not be used as the sole basis for treatment or other  patient management decisions.  A negative result may occur with  improper specimen collection / handling, submission of specimen other  than nasopharyngeal swab, presence of viral mutation(s) within the  areas targeted by this assay, and inadequate number of viral copies  (<250 copies / mL). A negative result must be combined with clinical  observations, patient history, and epidemiological information. If result is POSITIVE SARS-CoV-2 target nucleic acids are DETECTED. The SARS-CoV-2 RNA is generally detectable in upper and lower  respiratory specimens dur ing the acute phase of infection.  Positive  results are indicative of active  infection with  SARS-CoV-2.  Clinical  correlation with patient history and other diagnostic information is  necessary to determine patient infection status.  Positive results do  not rule out bacterial infection or co-infection with other viruses. If result is PRESUMPTIVE POSTIVE SARS-CoV-2 nucleic acids MAY BE PRESENT.   A presumptive positive result was obtained on the submitted specimen  and confirmed on repeat testing.  While 2019 novel coronavirus  (SARS-CoV-2) nucleic acids may be present in the submitted sample  additional confirmatory testing may be necessary for epidemiological  and / or clinical management purposes  to differentiate between  SARS-CoV-2 and other Sarbecovirus currently known to infect humans.  If clinically indicated additional testing with an alternate test  methodology [phone]) is advised. The SARS-CoV-2 RNA is generally  detectable in upper and lower respiratory sp ecimens during the acute  phase of infection. The expected result is Negative. Fact Sheet for Patients:  StrictlyIdeas.no Fact Sheet for Healthcare Providers: BankingDealers.co.za This test is not yet approved or cleared by the [place] FDA and has been authorized for detection and/or diagnosis of SARS-CoV-2 by FDA under an Emergency Use Authorization (EUA).  This EUA will remain in effect (meaning this test can be used) for the duration of the COVID-19 declaration under Section 564(b)(1) of the Act, 21 U.S.C. section 360bbb-3(b)(1), unless the authorization is terminated or revoked sooner. Performed at [facility], [address], [place], [place] [address]   Blood Culture (routine x 2)     Status: None   Collection Time: 06/26/19 11:21 AM   Specimen: BLOOD RIGHT ARM  Result Value Ref Range Status   Specimen Description BLOOD RIGHT ARM  Final   Special Requests   Final    BOTTLES DRAWN AEROBIC AND ANAEROBIC Blood Culture adequate volume    Culture   Final    NO GROWTH 6 DAYS Performed at [facility], [address], [place], [place] [address]    Report Status 07/02/2019 FINAL  Final  Blood Culture (routine x 2)     Status: None   Collection Time: 06/26/19 11:32 AM   Specimen: BLOOD RIGHT FOREARM  Result Value Ref Range Status   Specimen Description BLOOD RIGHT FOREARM  Final   Special Requests   Final    BOTTLES DRAWN AEROBIC AND ANAEROBIC Blood Culture adequate volume   Culture   Final    NO GROWTH 6 DAYS Performed at [facility], [address], [place], [place] [address]    Report Status 07/02/2019 FINAL  Final  MRSA PCR Screening     Status: None   Collection Time: 06/26/19  6:15 PM   Specimen: Nasal Mucosa; Nasopharyngeal  Result Value Ref Range Status   MRSA by PCR NEGATIVE NEGATIVE Final    Comment:        The GeneXpert MRSA Assay (FDA approved for NASAL specimens only), is one component of a comprehensive MRSA colonization surveillance program. It is not intended to diagnose MRSA infection nor to guide or monitor treatment for MRSA infections. Performed at [facility], [address], [place], [place] [address]    Studies/Results: Ct Chest Wo Contrast  Result Date: 07/03/2019 CLINICAL DATA:  Follow-up abnormal chest radiograph recent pulmonary embolus. EXAM: CT CHEST WITHOUT CONTRAST TECHNIQUE: Multidetector CT imaging of the chest was performed following the standard protocol without IV contrast. COMPARISON:  CT chest 06/26/2019 FINDINGS: Cardiovascular: The heart size appears normal. Aortic atherosclerosis identified. Increase caliber of the main pulmonary artery measuring 3.5 cm suggestive of chronic pulmonary arterial hypertension.  Three vessel coronary artery calcifications identified. Mediastinum/Nodes: Normal appearance of the thyroid gland. The trachea appears patent and is midline. Normal appearance of the esophagus. Right paratracheal lymph node measures 1.1 cm, image 64/2. Similar to previous  exam. No axillary or supraclavicular adenopathy. Lungs/Pleura: Increase in volume of left pleural effusion. Interval development of extensive bilateral areas of ground-glass attenuation throughout both lungs, most severely in the right upper lobe. Persistent wedge-shaped and peripheral areas of subpleural consolidation within the right lower lobe likely reflecting pulmonary infarct. Within the left upper lobe there is a new geographic area of peripheral ground-glass attenuation, airspace consolidation and internal cavitation likely reflecting pulmonary infarct, image 26/4. Within the anterolateral subpleural aspect of the left lower lobe there is similar area of ground-glass attenuation and progressive cavitation concerning for evolving changes of pulmonary infarct. Solid spiculated mass within the right lung apex is again noted and appears unchanged measuring 2.7 cm, image 30/4. This is suspicious for pulmonary neoplasm Upper Abdomen: No acute abnormality. Musculoskeletal: No acute abnormality identified. Similar appearance of increased sclerosis associated with T3, T4 and T5. IMPRESSION: 1. Interval development of extensive bilateral areas of ground-glass attenuation, which may represent areas of pulmonary edema or alveolar hemorrhage secondary to infarct. New geographic area of ground-glass attenuation, mild airspace consolidation and internal cavitation within the left upper lobe concerning for infarct. Persistent and slightly progressive area of ground-glass attenuation and cavitation is noted in the left lower lobe also worrisome for sequelae of pulmonary infarct. 2. Stable appearance of spiculated mass within the right lung apex which is suspicious for primary bronchogenic carcinoma. 3. Increase in volume of left pleural effusion. 4. Three vessel coronary artery calcifications noted. 5. Increase caliber of the main pulmonary artery suggestive of chronic pulmonary arterial hypertension. 6. Areas increased  sclerosis within the upper thoracic vertebra noted. Primary differential considerations include metastatic disease versus discogenic sclerosis. Aortic Atherosclerosis (ICD10-I70.0). Electronically Signed   By: Kerby Moors M.D.   On: 07/03/2019 11:28    Medications:  Prior to Admission:  Medications Prior to Admission  Medication Sig Dispense Refill Last Dose  . albuterol (PROVENTIL HFA;VENTOLIN HFA) 108 (90 BASE) MCG/ACT inhaler Inhale 2 puffs into the lungs 4 (four) times daily.    06/25/2019 at Unknown time  . aspirin 81 MG chewable tablet Chew 81 mg by mouth daily.   06/25/2019 at Unknown time  . CHANTIX 0.5 MG tablet Take 0.5 mg by mouth 2 (two) times daily.    06/25/2019 at Unknown time  . clopidogrel (PLAVIX) 75 MG tablet Take 75 mg by mouth at bedtime.    06/25/2019 at Unknown time  . INVOKANA 300 MG TABS tablet Take 300 mg by mouth daily.    06/25/2019 at Unknown time  . levofloxacin (LEVAQUIN) 750 MG tablet Take 750 mg by mouth daily. 7 day course starting on 06/21/2019   06/25/2019 at Unknown time  . oxyCODONE-acetaminophen (PERCOCET) 10-325 MG tablet Take 1 tablet by mouth every 6 (six) hours as needed for pain. (Patient taking differently: Take 1 tablet by mouth 4 (four) times daily. ) 150 tablet 0 06/25/2019 at Unknown time  . polyethylene glycol powder (GLYCOLAX/MIRALAX) powder Take 17 g by mouth daily as needed (For constipation.).    06/25/2019 at Unknown time  . pravastatin (PRAVACHOL) 40 MG tablet Take 40 mg by mouth daily.   06/25/2019 at Unknown time  . predniSONE (DELTASONE) 20 MG tablet Take 20 mg by mouth 2 (two) times daily. 21 day course starting 06/21/2019  06/25/2019 at Unknown time  . TRULICITY 6.12 AE/4.9PN SOPN Inject 0.5 mLs into the skin once a week.    06/25/2019 at Unknown time   Scheduled: . acetylcysteine  2 mL Nebulization TID  . albuterol  8 puff Inhalation Once  . apixaban  10 mg Oral BID   Followed by  . [START ON 07/09/2019] apixaban  5 mg Oral BID   . budesonide (PULMICORT) nebulizer solution  0.5 mg Nebulization BID  . Chlorhexidine Gluconate Cloth  6 each Topical Daily  . feeding supplement (GLUCERNA SHAKE)  237 mL Oral TID BM  . guaiFENesin  1,200 mg Oral BID  . insulin aspart  0-20 Units Subcutaneous TID WC  . insulin aspart  0-5 Units Subcutaneous QHS  . insulin aspart  10 Units Subcutaneous TID WC  . insulin detemir  30 Units Subcutaneous Daily  . ipratropium  0.5 mg Nebulization Q6H  . methylPREDNISolone (SOLU-MEDROL) injection  40 mg Intravenous Q12H  . varenicline  0.5 mg Oral BID   Continuous: . ampicillin-sulbactam (UNASYN) IV 3 g (07/05/19 0222)   PYY:[id number] **OR** acetaminophen, ALPRAZolam, levalbuterol, ondansetron **OR** ondansetron (ZOFRAN) IV, oxyCODONE-acetaminophen **AND** oxyCODONE, polyethylene glycol  Assesment: He was admitted with acute shortness of breath and acute hypoxic respiratory failure.  He was found to have pulmonary emboli but also has abnormal x-ray with likely pneumonia.  He has what looks like pulmonary infarctions on CT.  He has some areas of cavitation and his antibiotics have been adjusted.  He may have lung cancer.  He was told that he had lung cancer some years ago but did not follow-up.  He has a lesion on chest x-ray that could indicate neoplasm.  His white blood count has been bouncing around and is back up over 30,000 this morning.  He had elevated troponin thought to be related to demand ischemia  He has COPD at baseline had continued nicotine use up until admission and he is being treated with steroids inhaled bronchodilators.  He has diabetes on sliding scale  At baseline he has chronic pain and he is on pain medication even at home.  He has been agitated at times but is better over the last 36 hours or so Principal Problem:   Acute pulmonary embolus (HCC) Active Problems:   Tobacco abuse   Acute respiratory failure with hypoxia ([place])   COPD with acute exacerbation  ([place])   Uncontrolled type 2 diabetes mellitus with hyperglycemia ([place])   Palliative care by specialist   Goals of care, counseling/discussion   Anxiety state   Lung cancer ([place])   PNA (pneumonia)   Chronic pain syndrome   Demand ischemia ([place])    Plan: Continue treatments.  He does seem to be slowly improving despite the increased white blood cell count    LOS: 9 days   Alonza Bogus 07/05/2019, 7:10 AM

## 2019-07-05 NOTE — Progress Notes (Signed)
Inpatient Diabetes Program Recommendations  AACE/ADA: New Consensus Statement on Inpatient Glycemic Control (2015)  Target Ranges:  Prepandial:   less than 140 mg/dL      Peak postprandial:   less than 180 mg/dL (1-2 hours)      Critically ill patients:  140 - 180 mg/dL   Lab Results  Component Value Date   GLUCAP 202 (H) 07/05/2019   HGBA1C 8.1 (H) 06/26/2019    Review of Glycemic Control Results for LEDFORD, GOODSON (MRN [id number]) as of 07/05/2019 11:28  Ref. Range 07/04/2019 07:42 07/04/2019 11:07 07/04/2019 16:31 07/04/2019 22:12 07/05/2019 08:06  Glucose-Capillary Latest Ref Range: 70 - 99 mg/dL 219 (H)  Novolog 17 units  Levemir 30 units  Solumedrol 40 mg 316 (H)  Novolog 25 units 279 (H)  Novolog 21 units     Solumedrol 40 mg 311 (H)  Novolog 4 units 202 (H)  Novolog 17 units  Levemir 30 units  Solumedrol 40 mg    Diabetes history: DM 2 Outpatient Diabetes medications: Invokana 063 QD + Trulicity 0.5 mg Qweek  Current orders for Inpatient glycemic control:  Levemir 30 units Daily Novolog 0-20 units + hs Novolog 10 units tid meal coverage  Solumedrol 40 mg Q12 hours  Inpatient Diabetes Program Recommendations:    If steroids remain at current dose, consider -  Increase Levemir to 35 units -  Increase Novolog meal coverage to 14 units tid  Steroids may be tapered.   Thanks,  Tama Headings RN, MSN, BC-ADM Inpatient Diabetes Coordinator Team Pager [phone] (8a-5p)

## 2019-07-06 LAB — GLUCOSE, CAPILLARY
Glucose-Capillary: 211 mg/dL — ABNORMAL HIGH (ref 70–99)
Glucose-Capillary: 249 mg/dL — ABNORMAL HIGH (ref 70–99)
Glucose-Capillary: 231 mg/dL — ABNORMAL HIGH (ref 70–99)
Glucose-Capillary: 289 mg/dL — ABNORMAL HIGH (ref 70–99)

## 2019-07-06 LAB — BASIC METABOLIC PANEL
Anion gap: 10 (ref 5–15)
BUN: 32 mg/dL — ABNORMAL HIGH (ref 6–20)
CO2: 24 mmol/L (ref 22–32)
Calcium: 9.1 mg/dL (ref 8.9–10.3)
Chloride: 101 mmol/L (ref 98–111)
Creatinine, Ser: 0.66 mg/dL (ref 0.61–1.24)
GFR calc Af Amer: >60 mL/min (ref >60)
GFR calc non Af Amer: >60 mL/min (ref >60)
Glucose, Bld: 252 mg/dL — ABNORMAL HIGH (ref 70–99)
Potassium: 4.6 mmol/L (ref 3.5–5.1)
Sodium: 135 mmol/L (ref 135–145)

## 2019-07-06 LAB — CBC
HCT: 42.9 % (ref 39.0–52.0)
Hemoglobin: 13.4 g/dL (ref 13.0–17.0)
MCH: 26.9 pg (ref 26.0–34.0)
MCHC: 31.2 g/dL (ref 30.0–36.0)
MCV: 86.1 fL (ref 80.0–100.0)
Platelets: 170 10*3/uL (ref 150–400)
RBC: 4.98 MIL/uL (ref 4.22–5.81)
RDW: 14.4 % (ref 11.5–15.5)
WBC: 35.4 10*3/uL — ABNORMAL HIGH (ref 4.0–10.5)
nRBC: 0 % (ref 0.0–0.2)

## 2019-07-06 MED ORDER — INSULIN ASPART 100 UNIT/ML ~~LOC~~ SOLN
14.0000 [IU] | Freq: Three times a day (TID) | SUBCUTANEOUS | Status: DC
  Administered 2019-07-06 (×3): 14 [IU] via SUBCUTANEOUS

## 2019-07-06 MED ORDER — PREDNISONE 20 MG PO TABS
40.0000 mg | ORAL_TABLET | Freq: Every day | ORAL | Status: DC
  Administered 2019-07-06 – 2019-07-09 (×4): 40 mg via ORAL
  Filled 2019-07-06 (×4): qty 2

## 2019-07-06 MED ORDER — SODIUM CHLORIDE 0.9% FLUSH
3.0000 mL | Freq: Two times a day (BID) | INTRAVENOUS | Status: DC
  Administered 2019-07-06 – 2019-07-10 (×8): 3 mL via INTRAVENOUS

## 2019-07-06 MED ORDER — SALINE SPRAY 0.65 % NA SOLN
1.0000 | NASAL | Status: DC | PRN
  Administered 2019-07-06: 10:00:00 1 via NASAL
  Filled 2019-07-06: qty 44

## 2019-07-06 MED ORDER — INSULIN DETEMIR 100 UNIT/ML ~~LOC~~ SOLN
35.0000 [IU] | Freq: Every day | SUBCUTANEOUS | Status: DC
  Administered 2019-07-06 – 2019-07-07 (×2): 35 [IU] via SUBCUTANEOUS
  Filled 2019-07-06 (×5): qty 0.35

## 2019-07-06 NOTE — Care Management Important Message (Signed)
Important Message  Patient Details  Name: Craig Owens MRN: [id number] Date of Birth: Apr 27, 1961   Medicare Important Message Given:  Yes     [name] 07/06/2019, 12:15 PM

## 2019-07-06 NOTE — Progress Notes (Signed)
PROGRESS NOTE    CEDRICK PARTAIN  ZDG:[id number] DOB: 14-Oct-1960 DOA: 06/26/2019 PCP: Lucia Gaskins, MD   Brief Narrative:  Per HPI: [name] a 58 y.o.malewith medical history ofCOPD, tobacco abuse, nonobstructive CAD, THC use, remote stroke presenting with acute onset of shortness of breath and chest discomfort approximately 2 hours prior to admission. The patient denies any fevers, chills, headache, neck pain, nausea, vomiting, diarrhea, abdominal pain. He has had a small amount of hemoptysis for 1 day. He denies any hematemesis, hematochezia, melena. Notably, the patient states that he was told that he had "lung cancer" approximately 5 years ago by his primary care provider, but the patient was lost to follow-up and never followed through with work-up. Nevertheless, the patient continued to smoke approximately 2 packs/day for the last 40 years. He uses THC but denies any other illegal drug use. Upon EMS arrival, the patient was noted to be hypoxic with oxygen saturation of 80% on 6 L. The patient was subsequently placed on nonrebreather in the emergency department with oxygen saturations 96-98%. In the emergency department, the patient was afebrile, tachycardicwith HR 110-120, and hypoxic requiring a nonrebreather with oxygen saturation 98%. CT angiogram chest showed pulmonary embolus in the bilateral distal main pulmonary artery with extension into the upper lobes and lower lobes bilateral. There was RV strain.  10/14:Patient appears to be doing well this morning with stable vital signs noted. He is actually on room air saturating near 100%. He denies any other symptoms and 2D echocardiogram is pending. We will plan to increase insulin coverage and wean steroids. Palliative consulted for goals of care discussion.  10/15:Patient continues to do well and has some minimal anxiety with hemoptysis this morning. 2D echocardiogram with LVEF 55-60%. Palliative  consultation pending. Plan to transfer to telemetry.  10/16:Patient continues to have excessive sputum output and appears congested. He is only minimally anxious, but likes to stay on his nonrebreather mask. No other acute events otherwise noted. Blood glucose levels are more stable with insulin adjustments yesterday. We will plan to add chest physiotherapy Mucomyst today.  10/19:Patient has completed course of IV heparin over the course of the weekend and will transition to Eliquis today. Plan for PT evaluation and weaning of oxygen as well as steroids. Continue IV antibiotics for 2 more days. Continue blood glucose control.  10/20:Patient has struggled on and off with use of BiPAP overnight and wants to remain on nonrebreather mask. Consulted pulmonology with plans for repeat CT chest given some new cavitary lesion seen on chest x-ray. Patient will be started on IV Unasyn with repeat CT chest. Continue Eliquis. Increase Lantus dose to 30 units daily.  10/21:Patient has refused wearing BiPAP last night and prefers nonrebreather. CT chest with findings of groundglass changes throughout with confirmation of new cavitary lesion seen in the left upper lobe. Patient remains on IV Unasyn as well as steroids. Adequate blood glucose control noted today.  10/22: Patient continues to refuse to wear BiPAP and prefers nonrebreather.  Worsening leukocytosis noted this morning.  He continues to demonstrate a stable clinical course on IV Unasyn.  10/23: Patient continues to have some persistent dyspnea and states that he may still require higher levels of oxygen, but has been weaned to 5 L overnight.  He continues to have persistent leukocytosis.  Plan to wean IV Solu-Medrol to oral prednisone today as he continues to remain hyperglycemic.  Continue IV Unasyn.  Assessment & Plan:   Principal Problem:   Acute pulmonary embolus (  [place]) Active Problems:   Tobacco abuse   Acute respiratory  failure with hypoxia ([facility])   COPD with acute exacerbation ([place])   Uncontrolled type 2 diabetes mellitus with hyperglycemia ([place])   Palliative care by specialist   Goals of care, counseling/discussion   Anxiety state   Lung cancer ([place])   PNA (pneumonia)   Chronic pain syndrome   Demand ischemia ([place])   Acute hypoxemic respiratory failure secondary to submassive PE-persistent -Case discussed with pulmonologyon admissionwho did not feel TPA currently indicated -Continue on Eliquis with anticipated lifelong therapy -Continue to monitor closely and likely transfer to telemetrytoday -2D echocardiogramwith LVEF 55-60%, normal RV size, but not well visualized -COVID testing negative -Repeat CT chestwith extensive groundglass changes noted in cavitary lesion  Acute COPD exacerbation with possible pneumonia-new cavitary lesions on chest x-ray -Continue IV Unasyn -Discontinue IV Solu-Medrol and switch to oral prednisone, discussed with pulmonology -Breathing treatmentsto be scheduled -Continue Pulmicort -Add Mucomyst as well as flutter valve and chest PT for congestion  Pulmonary opacities/nodules -Suspicious for cavitary lung cancer and and bony metastasis -We will have follow-up with oncology in the near future,patient would like to remain aggressive with care -Patient was apparently told he had lung cancer 5 years ago but never pursued work-up  Elevated troponin secondary to PE with RV strain -2D echocardiogram with LVEF 55-60% without significant RV issues noted -Does not appear to be clinically significant for ACS  Type 2 diabetes withhyperglycemia -Hemoglobin A1c 8.1% -Levemir increased to 35 units and mealtime insulin to 14 units 3 times daily, weaning steroids to oral prednisone on 10/23 -Continue SSI and carb modified diet and follow carefully  Remote CVA 5 years ago -Hold aspirin and Plavix while on heparin drip -Lipid panel with LDL of 53  THC and tobacco  abuse -Urine drug screen with no acute findings -Discussed tobacco cessation and will consider nicotine patch as needed  Chronic pain syndrome -Patient currently on Percocet 5/325 as needed for pain control -Queried on PMP aware   DVT prophylaxis:Eliquis Code Status:Full Family Communication:None at bedside Disposition Plan:Continue current treatments. Appreciate pulmonology recommendations.  Weaning steroids today and monitoring sugar.  Up in chair with repeat PT evaluation.  Wean oxygen.   Consultants:  Palliative  Pulmonology  Procedures:  None  Antimicrobials:  Anti-infectives (From admission, onward)   Start     Dose/Rate Route Frequency Ordered Stop   07/03/19 0900  Ampicillin-Sulbactam (UNASYN) 3 g in sodium chloride 0.9 % 100 mL IVPB     3 g 200 mL/hr over 30 Minutes Intravenous Every 6 hours 07/03/19 0808     07/02/19 1115  azithromycin (ZITHROMAX) 500 mg in sodium chloride 0.9 % 250 mL IVPB     500 mg 250 mL/hr over 60 Minutes Intravenous Every 24 hours 07/02/19 0947 07/02/19 1210   07/02/19 1115  cefTRIAXone (ROCEPHIN) 2 g in sodium chloride 0.9 % 100 mL IVPB     2 g 200 mL/hr over 30 Minutes Intravenous Every 24 hours 07/02/19 0947 07/02/19 1056   06/26/19 1115  cefTRIAXone (ROCEPHIN) 2 g in sodium chloride 0.9 % 100 mL IVPB  Status:  Discontinued     2 g 200 mL/hr over 30 Minutes Intravenous Every 24 hours 06/26/19 1105 07/02/19 0947   06/26/19 1115  azithromycin (ZITHROMAX) 500 mg in sodium chloride 0.9 % 250 mL IVPB  Status:  Discontinued     500 mg 250 mL/hr over 60 Minutes Intravenous Every 24 hours 06/26/19 1105 07/02/19 0947  Subjective: Patient seen and evaluated today with no new acute complaints or concerns. No acute concerns or events noted overnight.  He is upset about having his oxygen weaned and feels that he needs a higher amount.  He is currently on 5 L and appears to be saturating well.  He continues to have persistent  dyspnea as well as a mild cough.  Objective: Vitals:   07/06/19 0100 07/06/19 0200 07/06/19 0400 07/06/19 0500  BP:  113/78 131/63   Pulse: 93 83 87 84  Resp: (!) 25 (!) 22 (!) 26 (!) 28  Temp:      TempSrc:      SpO2: 98% 100% 100% 100%  Weight:      Height:        Intake/Output Summary (Last 24 hours) at 07/06/2019 0653 Last data filed at 07/06/2019 0445 Gross per 24 hour  Intake -  Output 1650 ml  Net -1650 ml   Filed Weights   07/02/19 0500 07/03/19 0500 07/05/19 0500  Weight: 103.4 kg 102.5 kg 99.3 kg    Examination:  General exam: Appears calm and comfortable  Respiratory system: Clear to auscultation. Respiratory effort normal.  Currently on 5 L nonrebreather. Cardiovascular system: S1 & S2 heard, RRR. No JVD, murmurs, rubs, gallops or clicks. No pedal edema. Gastrointestinal system: Abdomen is nondistended, soft and nontender. No organomegaly or masses felt. Normal bowel sounds heard. Central nervous system: Alert and oriented. No focal neurological deficits. Extremities: Symmetric 5 x 5 power. Skin: No rashes, lesions or ulcers Psychiatry: Judgement and insight appear normal. Mood & affect appropriate.     Data Reviewed: I have personally reviewed following labs and imaging studies  CBC: Recent Labs  Lab 07/02/19 0353 07/03/19 0413 07/04/19 0352 07/05/19 0422 07/06/19 0433  WBC 22.4* 31.4* 24.9* 33.4* 35.4*  HGB 13.1 14.8 14.4 13.6 13.4  HCT 43.0 47.1 46.4 44.0 42.9  MCV 86.3 86.4 86.4 86.6 86.1  PLT 308 212 163 153 102   Basic Metabolic Panel: Recent Labs  Lab 07/02/19 0353 07/03/19 0413 07/04/19 0352 07/05/19 0422 07/06/19 0433  NA 133* 137 135 135 135  K 4.9 4.9 4.4 4.7 4.6  CL 99 102 100 101 101  CO2 24 24 25 25 24   GLUCOSE 349* 292* 221* 251* 252*  BUN 29* 35* 35* 33* 32*  CREATININE 0.71 0.70 0.65 0.61 0.66  CALCIUM 9.1 9.3 9.1 9.1 9.1   GFR: Estimated Creatinine Clearance: 121.8 mL/min (by C-G formula based on SCr of 0.66  mg/dL). Liver Function Tests: Recent Labs  Lab 07/02/19 0353  AST 12*  ALT 11  ALKPHOS 61  BILITOT 0.3  PROT 6.3*  ALBUMIN 2.3*   No results for input(s): LIPASE, AMYLASE in the last 168 hours. No results for input(s): AMMONIA in the last 168 hours. Coagulation Profile: No results for input(s): INR, PROTIME in the last 168 hours. Cardiac Enzymes: No results for input(s): CKTOTAL, CKMB, CKMBINDEX, TROPONINI in the last 168 hours. BNP (last 3 results) No results for input(s): PROBNP in the last 8760 hours. HbA1C: No results for input(s): HGBA1C in the last 72 hours. CBG: Recent Labs  Lab 07/04/19 2212 07/05/19 0806 07/05/19 1139 07/05/19 1554 07/05/19 2209  GLUCAP 311* 202* 338* 351* 293*   Lipid Profile: No results for input(s): CHOL, HDL, LDLCALC, TRIG, CHOLHDL, LDLDIRECT in the last 72 hours. Thyroid Function Tests: No results for input(s): TSH, T4TOTAL, FREET4, T3FREE, THYROIDAB in the last 72 hours. Anemia Panel: No results for input(s): VITAMINB12,  FOLATE, FERRITIN, TIBC, IRON, RETICCTPCT in the last 72 hours. Sepsis Labs: No results for input(s): PROCALCITON, LATICACIDVEN in the last 168 hours.  Recent Results (from the past 240 hour(s))  SARS Coronavirus 2 by RT PCR (hospital order, performed in [facility] hospital lab) Nasopharyngeal Nasopharyngeal Swab     Status: None   Collection Time: 06/26/19 11:06 AM   Specimen: Nasopharyngeal Swab  Result Value Ref Range Status   SARS Coronavirus 2 NEGATIVE NEGATIVE Final    Comment: (NOTE) If result is NEGATIVE SARS-CoV-2 target nucleic acids are NOT DETECTED. The SARS-CoV-2 RNA is generally detectable in upper and lower  respiratory specimens during the acute phase of infection. The lowest  concentration of SARS-CoV-2 viral copies this assay can detect is 250  copies / mL. A negative result does not preclude SARS-CoV-2 infection  and should not be used as the sole basis for treatment or other  patient management  decisions.  A negative result may occur with  improper specimen collection / handling, submission of specimen other  than nasopharyngeal swab, presence of viral mutation(s) within the  areas targeted by this assay, and inadequate number of viral copies  (<250 copies / mL). A negative result must be combined with clinical  observations, patient history, and epidemiological information. If result is POSITIVE SARS-CoV-2 target nucleic acids are DETECTED. The SARS-CoV-2 RNA is generally detectable in upper and lower  respiratory specimens dur ing the acute phase of infection.  Positive  results are indicative of active infection with SARS-CoV-2.  Clinical  correlation with patient history and other diagnostic information is  necessary to determine patient infection status.  Positive results do  not rule out bacterial infection or co-infection with other viruses. If result is PRESUMPTIVE POSTIVE SARS-CoV-2 nucleic acids MAY BE PRESENT.   A presumptive positive result was obtained on the submitted specimen  and confirmed on repeat testing.  While 2019 novel coronavirus  (SARS-CoV-2) nucleic acids may be present in the submitted sample  additional confirmatory testing may be necessary for epidemiological  and / or clinical management purposes  to differentiate between  SARS-CoV-2 and other Sarbecovirus currently known to infect humans.  If clinically indicated additional testing with an alternate test  methodology [phone]) is advised. The SARS-CoV-2 RNA is generally  detectable in upper and lower respiratory sp ecimens during the acute  phase of infection. The expected result is Negative. Fact Sheet for Patients:  StrictlyIdeas.no Fact Sheet for Healthcare Providers: BankingDealers.co.za This test is not yet approved or cleared by the [place] FDA and has been authorized for detection and/or diagnosis of SARS-CoV-2 by FDA under an  Emergency Use Authorization (EUA).  This EUA will remain in effect (meaning this test can be used) for the duration of the COVID-19 declaration under Section 564(b)(1) of the Act, 21 U.S.C. section 360bbb-3(b)(1), unless the authorization is terminated or revoked sooner. Performed at [facility], [address] [place], [place] [address]   Blood Culture (routine x 2)     Status: None   Collection Time: 06/26/19 11:21 AM   Specimen: BLOOD RIGHT ARM  Result Value Ref Range Status   Specimen Description BLOOD RIGHT ARM  Final   Special Requests   Final    BOTTLES DRAWN AEROBIC AND ANAEROBIC Blood Culture adequate volume   Culture   Final    NO GROWTH 6 DAYS Performed at [facility], [address] [place],  [address]    Report Status 07/02/2019 FINAL  Final  Blood Culture (  routine x 2)     Status: None   Collection Time: 06/26/19 11:32 AM   Specimen: BLOOD RIGHT FOREARM  Result Value Ref Range Status   Specimen Description BLOOD RIGHT FOREARM  Final   Special Requests   Final    BOTTLES DRAWN AEROBIC AND ANAEROBIC Blood Culture adequate volume   Culture   Final    NO GROWTH 6 DAYS Performed at [facility], [address] [place], [place] [address]    Report Status 07/02/2019 FINAL  Final  MRSA PCR Screening     Status: None   Collection Time: 06/26/19  6:15 PM   Specimen: Nasal Mucosa; Nasopharyngeal  Result Value Ref Range Status   MRSA by PCR NEGATIVE NEGATIVE Final    Comment:        The GeneXpert MRSA Assay (FDA approved for NASAL specimens only), is one component of a comprehensive MRSA colonization surveillance program. It is not intended to diagnose MRSA infection nor to guide or monitor treatment for MRSA infections. Performed at [facility], [address], [place], [place] [address]          Radiology Studies: No results found.      Scheduled Meds: . acetylcysteine  2 mL Nebulization TID  . albuterol  8 puff Inhalation Once  . apixaban   10 mg Oral BID   Followed by  . [START ON 07/09/2019] apixaban  5 mg Oral BID  . budesonide (PULMICORT) nebulizer solution  0.5 mg Nebulization BID  . Chlorhexidine Gluconate Cloth  6 each Topical Daily  . feeding supplement (GLUCERNA SHAKE)  237 mL Oral TID BM  . guaiFENesin  1,200 mg Oral BID  . insulin aspart  0-20 Units Subcutaneous TID WC  . insulin aspart  0-5 Units Subcutaneous QHS  . insulin aspart  14 Units Subcutaneous TID WC  . insulin detemir  35 Units Subcutaneous Daily  . ipratropium-albuterol  3 mL Nebulization TID  . predniSONE  40 mg Oral Q breakfast  . varenicline  0.5 mg Oral BID   Continuous Infusions: . ampicillin-sulbactam (UNASYN) IV 3 g (07/06/19 0227)     LOS: 10 days    Time spent: 30 minutes    Chalonda Schlatter Darleen Crocker, DO [facility] Pager [phone]  If 7PM-7AM, please contact night-coverage [email or web address] Password [facility] 07/06/2019, 6:53 AM

## 2019-07-06 NOTE — Progress Notes (Signed)
Subjective: He says he feels better but he feels like he needs more oxygen.  His oxygenation is excellent on lower FiO2 but he does not feel like he is breathing is well.  I explained to him that there is not necessarily a correlation between being short of breath and need for oxygen but I do not think he really accepts that.  He is coughing a little bit mostly nonproductively.  Objective: Vital signs in last 24 hours: Temp:  [97.4 F (36.3 C)-97.9 F (36.6 C)] 97.9 F (36.6 C) (10/22 1552) Pulse Rate:  [81-95] 84 (10/23 0500) Resp:  [22-35] 28 (10/23 0500) BP: (111-134)/(63-78) 131/63 (10/23 0400) SpO2:  [98 %-100 %] 100 % (10/23 0500) Weight change:  Last BM Date: 06/30/19  Intake/Output from previous day: 10/22 0701 - 10/23 0700 In: -  Out: 1250 [Urine:1250]  PHYSICAL EXAM General appearance: alert, cooperative and no distress Resp: rhonchi bilaterally Cardio: regular rate and rhythm, S1, S2 normal, no murmur, click, rub or gallop GI: soft, non-tender; bowel sounds normal; no masses,  no organomegaly Extremities: extremities normal, atraumatic, no cyanosis or edema  Lab Results:  Results for orders placed or performed during the hospital encounter of 06/26/19 (from the past 48 hour(s))  Glucose, capillary     Status: Abnormal   Collection Time: 07/04/19  7:42 AM  Result Value Ref Range   Glucose-Capillary 219 (H) 70 - 99 mg/dL  Glucose, capillary     Status: Abnormal   Collection Time: 07/04/19 11:07 AM  Result Value Ref Range   Glucose-Capillary 316 (H) 70 - 99 mg/dL  Glucose, capillary     Status: Abnormal   Collection Time: 07/04/19  4:31 PM  Result Value Ref Range   Glucose-Capillary 279 (H) 70 - 99 mg/dL  Glucose, capillary     Status: Abnormal   Collection Time: 07/04/19 10:12 PM  Result Value Ref Range   Glucose-Capillary 311 (H) 70 - 99 mg/dL  Basic metabolic panel     Status: Abnormal   Collection Time: 07/05/19  4:22 AM  Result Value Ref Range   Sodium  135 135 - 145 mmol/L   Potassium 4.7 3.5 - 5.1 mmol/L   Chloride 101 98 - 111 mmol/L   CO2 25 22 - 32 mmol/L   Glucose, Bld 251 (H) 70 - 99 mg/dL   BUN 33 (H) 6 - 20 mg/dL   Creatinine, Ser 0.61 0.61 - 1.24 mg/dL   Calcium 9.1 8.9 - 10.3 mg/dL   GFR calc non Af Amer >60 >60 mL/min   GFR calc Af Amer >60 >60 mL/min   Anion gap 9 5 - 15    Comment: Performed at [facility], [address], [place], [place] [address]  CBC     Status: Abnormal   Collection Time: 07/05/19  4:22 AM  Result Value Ref Range   WBC 33.4 (H) 4.0 - 10.5 K/uL   RBC 5.08 4.22 - 5.81 MIL/uL   Hemoglobin 13.6 13.0 - 17.0 g/dL   HCT 44.0 39.0 - 52.0 %   MCV 86.6 80.0 - 100.0 fL   MCH 26.8 26.0 - 34.0 pg   MCHC 30.9 30.0 - 36.0 g/dL   RDW 14.5 11.5 - 15.5 %   Platelets 153 150 - 400 K/uL   nRBC 0.0 0.0 - 0.2 %    Comment: Performed at [facility], [address] [place], [place] [address]  Glucose, capillary     Status: Abnormal   Collection Time: 07/05/19  8:06 AM  Result Value Ref Range   Glucose-Capillary 202 (H) 70 - 99 mg/dL  Glucose, capillary     Status: Abnormal   Collection Time: 07/05/19 11:39 AM  Result Value Ref Range   Glucose-Capillary 338 (H) 70 - 99 mg/dL  Glucose, capillary     Status: Abnormal   Collection Time: 07/05/19  3:54 PM  Result Value Ref Range   Glucose-Capillary 351 (H) 70 - 99 mg/dL  Glucose, capillary     Status: Abnormal   Collection Time: 07/05/19 10:09 PM  Result Value Ref Range   Glucose-Capillary 293 (H) 70 - 99 mg/dL  CBC     Status: Abnormal   Collection Time: 07/06/19  4:33 AM  Result Value Ref Range   WBC 35.4 (H) 4.0 - 10.5 K/uL   RBC 4.98 4.22 - 5.81 MIL/uL   Hemoglobin 13.4 13.0 - 17.0 g/dL   HCT 42.9 39.0 - 52.0 %   MCV 86.1 80.0 - 100.0 fL   MCH 26.9 26.0 - 34.0 pg   MCHC 31.2 30.0 - 36.0 g/dL   RDW 14.4 11.5 - 15.5 %   Platelets 170 150 - 400 K/uL   nRBC 0.0 0.0 - 0.2 %    Comment: Performed at [facility], [address] [place], [place] [address]   Basic metabolic panel     Status: Abnormal   Collection Time: 07/06/19  4:33 AM  Result Value Ref Range   Sodium 135 135 - 145 mmol/L   Potassium 4.6 3.5 - 5.1 mmol/L   Chloride 101 98 - 111 mmol/L   CO2 24 22 - 32 mmol/L   Glucose, Bld 252 (H) 70 - 99 mg/dL   BUN 32 (H) 6 - 20 mg/dL   Creatinine, Ser 0.66 0.61 - 1.24 mg/dL   Calcium 9.1 8.9 - 10.3 mg/dL   GFR calc non Af Amer >60 >60 mL/min   GFR calc Af Amer >60 >60 mL/min   Anion gap 10 5 - 15    Comment: Performed at [facility], [address] [place],  [address]    ABGS No results for input(s): PHART, PO2ART, TCO2, HCO3 in the last 72 hours.  Invalid input(s): PCO2 CULTURES Recent Results (from the past 240 hour(s))  SARS Coronavirus 2 by RT PCR (hospital order, performed in [facility] hospital lab) Nasopharyngeal Nasopharyngeal Swab     Status: None   Collection Time: 06/26/19 11:06 AM   Specimen: Nasopharyngeal Swab  Result Value Ref Range Status   SARS Coronavirus 2 NEGATIVE NEGATIVE Final    Comment: (NOTE) If result is NEGATIVE SARS-CoV-2 target nucleic acids are NOT DETECTED. The SARS-CoV-2 RNA is generally detectable in upper and lower  respiratory specimens during the acute phase of infection. The lowest  concentration of SARS-CoV-2 viral copies this assay can detect is 250  copies / mL. A negative result does not preclude SARS-CoV-2 infection  and should not be used as the sole basis for treatment or other  patient management decisions.  A negative result may occur with  improper specimen collection / handling, submission of specimen other  than nasopharyngeal swab, presence of viral mutation(s) within the  areas targeted by this assay, and inadequate number of viral copies  (<250 copies / mL). A negative result must be combined with clinical  observations, patient history, and epidemiological information. If result is POSITIVE SARS-CoV-2 target nucleic acids are DETECTED. The SARS-CoV-2 RNA is  generally detectable in upper and lower  respiratory specimens dur  ing the acute phase of infection.  Positive  results are indicative of active infection with SARS-CoV-2.  Clinical  correlation with patient history and other diagnostic information is  necessary to determine patient infection status.  Positive results do  not rule out bacterial infection or co-infection with other viruses. If result is PRESUMPTIVE POSTIVE SARS-CoV-2 nucleic acids MAY BE PRESENT.   A presumptive positive result was obtained on the submitted specimen  and confirmed on repeat testing.  While 2019 novel coronavirus  (SARS-CoV-2) nucleic acids may be present in the submitted sample  additional confirmatory testing may be necessary for epidemiological  and / or clinical management purposes  to differentiate between  SARS-CoV-2 and other Sarbecovirus currently known to infect humans.  If clinically indicated additional testing with an alternate test  methodology [phone]) is advised. The SARS-CoV-2 RNA is generally  detectable in upper and lower respiratory sp ecimens during the acute  phase of infection. The expected result is Negative. Fact Sheet for Patients:  StrictlyIdeas.no Fact Sheet for Healthcare Providers: BankingDealers.co.za This test is not yet approved or cleared by the [place] FDA and has been authorized for detection and/or diagnosis of SARS-CoV-2 by FDA under an Emergency Use Authorization (EUA).  This EUA will remain in effect (meaning this test can be used) for the duration of the COVID-19 declaration under Section 564(b)(1) of the Act, 21 U.S.C. section 360bbb-3(b)(1), unless the authorization is terminated or revoked sooner. Performed at [facility], [address], [place], [place] [address]   Blood Culture (routine x 2)     Status: None   Collection Time: 06/26/19 11:21 AM   Specimen: BLOOD RIGHT ARM  Result Value Ref Range Status    Specimen Description BLOOD RIGHT ARM  Final   Special Requests   Final    BOTTLES DRAWN AEROBIC AND ANAEROBIC Blood Culture adequate volume   Culture   Final    NO GROWTH 6 DAYS Performed at [facility], [address], [place], [place] [address]    Report Status 07/02/2019 FINAL  Final  Blood Culture (routine x 2)     Status: None   Collection Time: 06/26/19 11:32 AM   Specimen: BLOOD RIGHT FOREARM  Result Value Ref Range Status   Specimen Description BLOOD RIGHT FOREARM  Final   Special Requests   Final    BOTTLES DRAWN AEROBIC AND ANAEROBIC Blood Culture adequate volume   Culture   Final    NO GROWTH 6 DAYS Performed at [facility], [address] [place],  [address]    Report Status 07/02/2019 FINAL  Final  MRSA PCR Screening     Status: None   Collection Time: 06/26/19  6:15 PM   Specimen: Nasal Mucosa; Nasopharyngeal  Result Value Ref Range Status   MRSA by PCR NEGATIVE NEGATIVE Final    Comment:        The GeneXpert MRSA Assay (FDA approved for NASAL specimens only), is one component of a comprehensive MRSA colonization surveillance program. It is not intended to diagnose MRSA infection nor to guide or monitor treatment for MRSA infections. Performed at [facility], [address] [place],  [address]    Studies/Results: No results found.  Medications:  Prior to Admission:  Medications Prior to Admission  Medication Sig Dispense Refill Last Dose  . albuterol (PROVENTIL HFA;VENTOLIN HFA) 108 (90 BASE) MCG/ACT inhaler Inhale 2 puffs into the lungs 4 (four) times daily.    06/25/2019 at Unknown time  . aspirin 81 MG  chewable tablet Chew 81 mg by mouth daily.   06/25/2019 at Unknown time  . CHANTIX 0.5 MG tablet Take 0.5 mg by mouth 2 (two) times daily.    06/25/2019 at Unknown time  . clopidogrel (PLAVIX) 75 MG tablet Take 75 mg by mouth at bedtime.    06/25/2019 at Unknown time  . INVOKANA 300 MG TABS tablet Take 300 mg by mouth daily.     06/25/2019 at Unknown time  . levofloxacin (LEVAQUIN) 750 MG tablet Take 750 mg by mouth daily. 7 day course starting on 06/21/2019   06/25/2019 at Unknown time  . oxyCODONE-acetaminophen (PERCOCET) 10-325 MG tablet Take 1 tablet by mouth every 6 (six) hours as needed for pain. (Patient taking differently: Take 1 tablet by mouth 4 (four) times daily. ) 150 tablet 0 06/25/2019 at Unknown time  . polyethylene glycol powder (GLYCOLAX/MIRALAX) powder Take 17 g by mouth daily as needed (For constipation.).    06/25/2019 at Unknown time  . pravastatin (PRAVACHOL) 40 MG tablet Take 40 mg by mouth daily.   06/25/2019 at Unknown time  . predniSONE (DELTASONE) 20 MG tablet Take 20 mg by mouth 2 (two) times daily. 21 day course starting 06/21/2019   06/25/2019 at Unknown time  . TRULICITY 5.46 FK/8.1EX SOPN Inject 0.5 mLs into the skin once a week.    06/25/2019 at Unknown time   Scheduled: . acetylcysteine  2 mL Nebulization TID  . albuterol  8 puff Inhalation Once  . apixaban  10 mg Oral BID   Followed by  . [START ON 07/09/2019] apixaban  5 mg Oral BID  . budesonide (PULMICORT) nebulizer solution  0.5 mg Nebulization BID  . Chlorhexidine Gluconate Cloth  6 each Topical Daily  . feeding supplement (GLUCERNA SHAKE)  237 mL Oral TID BM  . guaiFENesin  1,200 mg Oral BID  . insulin aspart  0-20 Units Subcutaneous TID WC  . insulin aspart  0-5 Units Subcutaneous QHS  . insulin aspart  14 Units Subcutaneous TID WC  . insulin detemir  35 Units Subcutaneous Daily  . ipratropium-albuterol  3 mL Nebulization TID  . predniSONE  40 mg Oral Q breakfast  . varenicline  0.5 mg Oral BID   Continuous: . ampicillin-sulbactam (UNASYN) IV 3 g (07/06/19 0227)   NTZ:[id number] **OR** acetaminophen, ALPRAZolam, levalbuterol, ondansetron **OR** ondansetron (ZOFRAN) IV, oxyCODONE-acetaminophen **AND** oxyCODONE, polyethylene glycol  Assesment: He was admitted with acute hypoxic respiratory failure.  He has had  submassive pulmonary emboli and has pulmonary infarctions.  These are likely at least to some extent infected and he is receiving antibiotics for that.  He has multiple cavitary lesion seen on chest CT and there is concerned that he may have lung cancer.  He apparently was told about 5 years ago that he had lung cancer but has not followed up.  He does have some abnormality seen on CT that could represent a carcinoma.  He has diabetes and has been on IV steroids and his blood sugar has been going up.  He has chronic pain which is unchanged  He has COPD exacerbation and that is being treated with steroids inhaled bronchodilators etc. Principal Problem:   Acute pulmonary embolus (HCC) Active Problems:   Tobacco abuse   Acute respiratory failure with hypoxia (HCC)   COPD with acute exacerbation ([place])   Uncontrolled type 2 diabetes mellitus with hyperglycemia ([place])   Palliative care by specialist   Goals of care, counseling/discussion   Anxiety state   Lung cancer (  [place])   PNA (pneumonia)   Chronic pain syndrome   Demand ischemia ([place])    Plan: Continue treatments.  I think it is okay to taper steroids.  Continue other medications.  Continue tapering oxygen as possible    LOS: 10 days   Alonza Bogus 07/06/2019, 7:09 AM

## 2019-07-06 NOTE — Progress Notes (Signed)
Pharmacy Antibiotic Note  Craig Owens is a 58 y.o. male admitted on 06/26/2019 with  acute pulmonary embolus. He just completed a 7-day course of both azithromyzin and ceftriaxone for COPD exacerbation. Pharmacy has been consulted for Unasyn dosing for cavitary pneumonia of the left lower lobe.   Plan: Continue Unasyn 3g IV q6h Pharmacy will continue to monitor labs, cultures and patient progress.  Height: 6\' 1"  (185.4 cm) Weight: 220 lb 0.3 oz (99.8 kg) IBW/kg (Calculated) : 79.9  Temp (24hrs), Avg:97.9 F (36.6 C), Min:97.4 F (36.3 C), Max:98.2 F (36.8 C)  Recent Labs  Lab 07/02/19 0353 07/03/19 0413 07/04/19 0352 07/05/19 0422 07/06/19 0433  WBC 22.4* 31.4* 24.9* 33.4* 35.4*  CREATININE 0.71 0.70 0.65 0.61 0.66    Estimated Creatinine Clearance: 122.1 mL/min (by C-G formula based on SCr of 0.66 mg/dL).    Allergies  Allergen Reactions  . Adhesive [Tape] Other (See Comments)    Blisters skin, peels off. Please use paper tape.   . Codeine Hives, Itching and Swelling  . Latex Hives    Antimicrobials this admission: azithromycin 10/13>> 10/20  ceftriaxone 10/13>>10/20 Unasyn 10/20>>    Microbiology results: 10-13 BC x2: ng  10/13  MRSA PCR:  neg  Thank you for allowing pharmacy to be a part of this patient's care.  Margot Ables, PharmD Clinical Pharmacist 07/06/2019 10:35 AM

## 2019-07-06 NOTE — TOC Progression Note (Signed)
Transition of Care [facility]) - Progression Note    Patient Details  Name: MORRISON MCBRYAR MRN: [id number] Date of Birth: Mar 12, 1961  Transition of Care [facility]) CM/SW Contact  Ihor Gully, LCSW Phone Number: 07/06/2019, 11:57 AM  Clinical Narrative:    Patient state that if he needs oxygen at discharge he wants to use [employer]. He states that he would also like a rollator. Advised that PT will likely evaluate patient and will make a recommendation on which safest equipment for his needs.   Expected Discharge Plan: [place] Barriers to Discharge: Continued Medical Work up  Expected Discharge Plan and Services Expected Discharge Plan: [place] arrangements for the past 2 months: Single Family Home                                       Social Determinants of Health (SDOH) Interventions    Readmission Risk Interventions No flowsheet data found.

## 2019-07-06 NOTE — Progress Notes (Signed)
Physical Therapy Treatment Patient Details Name: Craig Owens MRN: [id number] DOB: [birth year] Today's Date: 07/06/2019    History of Present Illness Craig Owens is a 58 y.o. male with medical history of COPD, tobacco abuse, nonobstructive CAD, THC use, remote stroke presenting with acute onset of shortness of breath and chest discomfort approximately 2 hours prior to admission.  The patient denies any fevers, chills, headache, neck pain, nausea, vomiting, diarrhea, abdominal pain.  He has had a small amount of hemoptysis for 1 day.  He denies any hematemesis, hematochezia, melena.Notably, the patient states that he was told that he had "lung cancer" approximately 5 years ago by his primary care provider, but the patient was lost to follow-up and never followed through with work-up.Nevertheless, the patient continued to smoke approximately 2 packs/day for the last 40 years.  He uses THC but denies any other illegal drug use.  Upon EMS arrival, the patient was noted to be hypoxic with oxygen saturation of 80% on 6 L.  The patient was subsequently placed on nonrebreather in the emergency department with oxygen saturations 96-98%.In the emergency department, the patient was afebrile, tachycardic with HR 110-120, and hypoxic requiring a nonrebreather with oxygen saturation 98%.  CT angiogram chest showed pulmonary embolus in the bilateral distal main pulmonary artery with extension into the upper lobes and lower lobes bilateral.  There was RV strain.    PT Comments    Patient agreeable for therapy, but c/o fatigue possibly due pain medication per patient.  Patient has to use his cane to pull self up to sitting with head of bed raised, tolerated completing BLE ROM/strengthening exercises while seated at bedside, limited to walking to commode in room to have a bowel movement, frequent coughing up phlegm during treatment and declined to ambulate out of room and went back to bed to c/o fatigue and difficulty  breathing.  Patient on 4 LPM O2 throughout visit with SpO2 at 97-100% - RN notified.   Patient will benefit from continued physical therapy in hospital and recommended venue below to increase strength, balance, endurance for safe ADLs and gait.   Follow Up Recommendations  Home health PT;Supervision - Intermittent     Equipment Recommendations  None recommended by PT    Recommendations for Other Services       Precautions / Restrictions Precautions Precautions: Fall Restrictions Weight Bearing Restrictions: No    Mobility  Bed Mobility Overal bed mobility: Needs Assistance Bed Mobility: Supine to Sit;Sit to Supine     Supine to sit: Supervision Sit to supine: Supervision   General bed mobility comments: slow labored movement  Transfers Overall transfer level: Needs assistance Equipment used: Straight cane Transfers: Sit to/from Stand;Stand Pivot Transfers Sit to Stand: Min assist Stand pivot transfers: Min guard;Min assist       General transfer comment: had diffiuclty completing sit to stand from commode  Ambulation/Gait Ambulation/Gait assistance: Supervision Gait Distance (Feet): 10 Feet Assistive device: Straight cane;IV Pole Gait Pattern/deviations: Step-to pattern;Decreased step length - right;Decreased step length - left;Decreased stride length Gait velocity: decreased   General Gait Details: limited tto ambulation in room with slow labored cadence using SPC and leaning on IV pole   Stairs             Wheelchair Mobility    Modified Rankin (Stroke Patients Only)       Balance Overall balance assessment: Needs assistance Sitting-balance support: Feet supported;No upper extremity supported Sitting balance-Leahy Scale: Good Sitting balance - Comments: seated at  EOB   Standing balance support: Single extremity supported;During functional activity Standing balance-Leahy Scale: Fair Standing balance comment: using SPC and/or IV pole                             Cognition Arousal/Alertness: Awake/alert Behavior During Therapy: WFL for tasks assessed/performed Overall Cognitive Status: Within Functional Limits for tasks assessed                                        Exercises General Exercises - Lower Extremity Long Arc Quad: Seated;AROM;Strengthening;Both;10 reps Toe Raises: Seated;AROM;Strengthening;10 reps;Both Heel Raises: Seated;AROM;Strengthening;10 reps;Both    General Comments        Pertinent Vitals/Pain Pain Assessment: Faces Faces Pain Scale: Hurts a little bit Pain Location: left side lower back Pain Descriptors / Indicators: Sore    Home Living                      Prior Function            PT Goals (current goals can now be found in the care plan section) Acute Rehab PT Goals Patient Stated Goal: return home PT Goal Formulation: With patient Time For Goal Achievement: 07/09/19 Potential to Achieve Goals: Fair Progress towards PT goals: Progressing toward goals    Frequency    Min 3X/week      PT Plan Current plan remains appropriate    Co-evaluation              AM-PAC PT "6 Clicks" Mobility   Outcome Measure  Help needed turning from your back to your side while in a flat bed without using bedrails?: None Help needed moving from lying on your back to sitting on the side of a flat bed without using bedrails?: None Help needed moving to and from a bed to a chair (including a wheelchair)?: A Little Help needed standing up from a chair using your arms (e.g., wheelchair or bedside chair)?: A Little Help needed to walk in hospital room?: A Little Help needed climbing 3-5 steps with a railing? : A Lot 6 Click Score: 19    End of Session Equipment Utilized During Treatment: Oxygen Activity Tolerance: Patient tolerated treatment well;Patient limited by fatigue Patient left: in bed;with call bell/phone within reach Nurse Communication: Mobility  status PT Visit Diagnosis: Unsteadiness on feet (R26.81);Other abnormalities of gait and mobility (R26.89);Muscle weakness (generalized) (M62.81)     Time: 8453-6468 PT Time Calculation (min) (ACUTE ONLY): 33 min  Charges:  $Therapeutic Exercise: 8-22 mins $Therapeutic Activity: 8-22 mins                     3:20 PM, 07/06/19 Lonell Grandchild, MPT Physical Therapist with [facility] 336 [phone] office [phone] mobile phone

## 2019-07-07 LAB — GLUCOSE, CAPILLARY
Glucose-Capillary: 168 mg/dL — ABNORMAL HIGH (ref 70–99)
Glucose-Capillary: 162 mg/dL — ABNORMAL HIGH (ref 70–99)
Glucose-Capillary: 283 mg/dL — ABNORMAL HIGH (ref 70–99)
Glucose-Capillary: 239 mg/dL — ABNORMAL HIGH (ref 70–99)

## 2019-07-07 LAB — CBC
HCT: 41.4 % (ref 39.0–52.0)
Hemoglobin: 12.9 g/dL — ABNORMAL LOW (ref 13.0–17.0)
MCH: 27 pg (ref 26.0–34.0)
MCHC: 31.2 g/dL (ref 30.0–36.0)
MCV: 86.6 fL (ref 80.0–100.0)
Platelets: 164 10*3/uL (ref 150–400)
RBC: 4.78 MIL/uL (ref 4.22–5.81)
RDW: 14.6 % (ref 11.5–15.5)
WBC: 26.6 10*3/uL — ABNORMAL HIGH (ref 4.0–10.5)
nRBC: 0 % (ref 0.0–0.2)

## 2019-07-07 LAB — BASIC METABOLIC PANEL
Anion gap: 11 (ref 5–15)
BUN: 34 mg/dL — ABNORMAL HIGH (ref 6–20)
CO2: 24 mmol/L (ref 22–32)
Calcium: 8.9 mg/dL (ref 8.9–10.3)
Chloride: 101 mmol/L (ref 98–111)
Creatinine, Ser: 0.55 mg/dL — ABNORMAL LOW (ref 0.61–1.24)
GFR calc Af Amer: >60 mL/min (ref >60)
GFR calc non Af Amer: >60 mL/min (ref >60)
Glucose, Bld: 182 mg/dL — ABNORMAL HIGH (ref 70–99)
Potassium: 4.1 mmol/L (ref 3.5–5.1)
Sodium: 136 mmol/L (ref 135–145)

## 2019-07-07 MED ORDER — INSULIN DETEMIR 100 UNIT/ML ~~LOC~~ SOLN
30.0000 [IU] | Freq: Every day | SUBCUTANEOUS | Status: DC
  Administered 2019-07-08 – 2019-07-10 (×3): 30 [IU] via SUBCUTANEOUS
  Filled 2019-07-07 (×4): qty 0.3

## 2019-07-07 MED ORDER — INSULIN ASPART 100 UNIT/ML ~~LOC~~ SOLN
10.0000 [IU] | Freq: Three times a day (TID) | SUBCUTANEOUS | Status: DC
  Administered 2019-07-07 – 2019-07-10 (×7): 10 [IU] via SUBCUTANEOUS

## 2019-07-07 NOTE — Progress Notes (Signed)
Subjective: He says he feels a little better.  He is still short of breath with exertion.  His oxygen has been able to be weaned.  His white blood count has improved but is still very high.  Objective: Vital signs in last 24 hours: Temp:  [97.6 F (36.4 C)-98.7 F (37.1 C)] 98.7 F (37.1 C) (10/24 0728) Pulse Rate:  [32-95] 92 (10/24 0728) Resp:  [19-31] 30 (10/24 0728) BP: (123-136)/(68-76) 130/70 (10/24 0600) SpO2:  [75 %-100 %] 100 % (10/24 0728) Weight:  [100.1 kg] 100.1 kg (10/24 0450) Weight change: 0.3 kg Last BM Date: 06/30/19  Intake/Output from previous day: 10/23 0701 - 10/24 0700 In: 363 [P.O.:360; I.V.:3] Out: 1450 [Urine:1450]  PHYSICAL EXAM General appearance: alert, cooperative and no distress Resp: rhonchi bilaterally Cardio: regular rate and rhythm, S1, S2 normal, no murmur, click, rub or gallop GI: soft, non-tender; bowel sounds normal; no masses,  no organomegaly Extremities: extremities normal, atraumatic, no cyanosis or edema  Lab Results:  Results for orders placed or performed during the hospital encounter of 06/26/19 (from the past 48 hour(s))  Glucose, capillary     Status: Abnormal   Collection Time: 07/05/19  8:06 AM  Result Value Ref Range   Glucose-Capillary 202 (H) 70 - 99 mg/dL  Glucose, capillary     Status: Abnormal   Collection Time: 07/05/19 11:39 AM  Result Value Ref Range   Glucose-Capillary 338 (H) 70 - 99 mg/dL  Glucose, capillary     Status: Abnormal   Collection Time: 07/05/19  3:54 PM  Result Value Ref Range   Glucose-Capillary 351 (H) 70 - 99 mg/dL  Glucose, capillary     Status: Abnormal   Collection Time: 07/05/19 10:09 PM  Result Value Ref Range   Glucose-Capillary 293 (H) 70 - 99 mg/dL  CBC     Status: Abnormal   Collection Time: 07/06/19  4:33 AM  Result Value Ref Range   WBC 35.4 (H) 4.0 - 10.5 K/uL   RBC 4.98 4.22 - 5.81 MIL/uL   Hemoglobin 13.4 13.0 - 17.0 g/dL   HCT 42.9 39.0 - 52.0 %   MCV 86.1 80.0 - 100.0  fL   MCH 26.9 26.0 - 34.0 pg   MCHC 31.2 30.0 - 36.0 g/dL   RDW 14.4 11.5 - 15.5 %   Platelets 170 150 - 400 K/uL   nRBC 0.0 0.0 - 0.2 %    Comment: Performed at [facility], [address] [place], [place] [address]  Basic metabolic panel     Status: Abnormal   Collection Time: 07/06/19  4:33 AM  Result Value Ref Range   Sodium 135 135 - 145 mmol/L   Potassium 4.6 3.5 - 5.1 mmol/L   Chloride 101 98 - 111 mmol/L   CO2 24 22 - 32 mmol/L   Glucose, Bld 252 (H) 70 - 99 mg/dL   BUN 32 (H) 6 - 20 mg/dL   Creatinine, Ser 0.66 0.61 - 1.24 mg/dL   Calcium 9.1 8.9 - 10.3 mg/dL   GFR calc non Af Amer >60 >60 mL/min   GFR calc Af Amer >60 >60 mL/min   Anion gap 10 5 - 15    Comment: Performed at [facility], [address], [place], [place] [address]  Glucose, capillary     Status: Abnormal   Collection Time: 07/06/19  7:44 AM  Result Value Ref Range   Glucose-Capillary 211 (H) 70 - 99 mg/dL  Glucose, capillary     Status:  Abnormal   Collection Time: 07/06/19 11:36 AM  Result Value Ref Range   Glucose-Capillary 249 (H) 70 - 99 mg/dL  Glucose, capillary     Status: Abnormal   Collection Time: 07/06/19  4:45 PM  Result Value Ref Range   Glucose-Capillary 231 (H) 70 - 99 mg/dL  Glucose, capillary     Status: Abnormal   Collection Time: 07/06/19  9:19 PM  Result Value Ref Range   Glucose-Capillary 289 (H) 70 - 99 mg/dL  CBC     Status: Abnormal   Collection Time: 07/07/19  4:38 AM  Result Value Ref Range   WBC 26.6 (H) 4.0 - 10.5 K/uL   RBC 4.78 4.22 - 5.81 MIL/uL   Hemoglobin 12.9 (L) 13.0 - 17.0 g/dL   HCT 41.4 39.0 - 52.0 %   MCV 86.6 80.0 - 100.0 fL   MCH 27.0 26.0 - 34.0 pg   MCHC 31.2 30.0 - 36.0 g/dL   RDW 14.6 11.5 - 15.5 %   Platelets 164 150 - 400 K/uL   nRBC 0.0 0.0 - 0.2 %    Comment: Performed at [facility], [address] [place], [place] [address]  Basic metabolic panel     Status: Abnormal   Collection Time: 07/07/19  4:38 AM  Result Value Ref Range   Sodium  136 135 - 145 mmol/L   Potassium 4.1 3.5 - 5.1 mmol/L   Chloride 101 98 - 111 mmol/L   CO2 24 22 - 32 mmol/L   Glucose, Bld 182 (H) 70 - 99 mg/dL   BUN 34 (H) 6 - 20 mg/dL   Creatinine, Ser 0.55 (L) 0.61 - 1.24 mg/dL   Calcium 8.9 8.9 - 10.3 mg/dL   GFR calc non Af Amer >60 >60 mL/min   GFR calc Af Amer >60 >60 mL/min   Anion gap 11 5 - 15    Comment: Performed at [facility], [address] [place], [place] [address]  Glucose, capillary     Status: Abnormal   Collection Time: 07/07/19  7:27 AM  Result Value Ref Range   Glucose-Capillary 168 (H) 70 - 99 mg/dL    ABGS No results for input(s): PHART, PO2ART, TCO2, HCO3 in the last 72 hours.  Invalid input(s): PCO2 CULTURES No results found for this or any previous visit (from the past 240 hour(s)). Studies/Results: No results found.  Medications:  Prior to Admission:  Medications Prior to Admission  Medication Sig Dispense Refill Last Dose  . albuterol (PROVENTIL HFA;VENTOLIN HFA) 108 (90 BASE) MCG/ACT inhaler Inhale 2 puffs into the lungs 4 (four) times daily.    06/25/2019 at Unknown time  . aspirin 81 MG chewable tablet Chew 81 mg by mouth daily.   06/25/2019 at Unknown time  . CHANTIX 0.5 MG tablet Take 0.5 mg by mouth 2 (two) times daily.    06/25/2019 at Unknown time  . clopidogrel (PLAVIX) 75 MG tablet Take 75 mg by mouth at bedtime.    06/25/2019 at Unknown time  . INVOKANA 300 MG TABS tablet Take 300 mg by mouth daily.    06/25/2019 at Unknown time  . levofloxacin (LEVAQUIN) 750 MG tablet Take 750 mg by mouth daily. 7 day course starting on 06/21/2019   06/25/2019 at Unknown time  . oxyCODONE-acetaminophen (PERCOCET) 10-325 MG tablet Take 1 tablet by mouth every 6 (six) hours as needed for pain. (Patient taking differently: Take 1 tablet by mouth 4 (four) times daily. ) 150 tablet 0 06/25/2019 at Unknown  time  . polyethylene glycol powder (GLYCOLAX/MIRALAX) powder Take 17 g by mouth daily as needed (For constipation.).     06/25/2019 at Unknown time  . pravastatin (PRAVACHOL) 40 MG tablet Take 40 mg by mouth daily.   06/25/2019 at Unknown time  . predniSONE (DELTASONE) 20 MG tablet Take 20 mg by mouth 2 (two) times daily. 21 day course starting 06/21/2019   06/25/2019 at Unknown time  . TRULICITY 0.81 KG/8.1EH SOPN Inject 0.5 mLs into the skin once a week.    06/25/2019 at Unknown time   Scheduled: . albuterol  8 puff Inhalation Once  . apixaban  10 mg Oral BID   Followed by  . [START ON 07/09/2019] apixaban  5 mg Oral BID  . budesonide (PULMICORT) nebulizer solution  0.5 mg Nebulization BID  . Chlorhexidine Gluconate Cloth  6 each Topical Daily  . feeding supplement (GLUCERNA SHAKE)  237 mL Oral TID BM  . guaiFENesin  1,200 mg Oral BID  . insulin aspart  0-20 Units Subcutaneous TID WC  . insulin aspart  0-5 Units Subcutaneous QHS  . insulin aspart  14 Units Subcutaneous TID WC  . insulin detemir  35 Units Subcutaneous Daily  . ipratropium-albuterol  3 mL Nebulization TID  . predniSONE  40 mg Oral Q breakfast  . sodium chloride flush  3 mL Intravenous Q12H  . varenicline  0.5 mg Oral BID   Continuous: . ampicillin-sulbactam (UNASYN) IV 3 g (07/07/19 0221)   UDJ:SHFWYOVZCHYIF **OR** acetaminophen, ALPRAZolam, levalbuterol, ondansetron **OR** ondansetron (ZOFRAN) IV, oxyCODONE-acetaminophen **AND** oxyCODONE, polyethylene glycol, sodium chloride  Assesment: He was admitted with acute hypoxic respiratory failure that is multifactorial and includes pulmonary embolism COPD exacerbation and now he has what appears to be cavitary pneumonia.  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  He also has pulmonary infarctions that are likely infected and he is on antibiotics for that.  He is slowly improving  He has chronic pain which is unchanged  He is deconditioned but it is felt that home health services would suffice  He is still hypoxic now on high flow nasal cannula and oxygenating 100%.  He has pretty intense  symptoms of shortness of breath.  He has anxiety and I think that makes his sensation of dyspnea worse Principal Problem:   Acute pulmonary embolus (HCC) Active Problems:   Tobacco abuse   Acute respiratory failure with hypoxia (HCC)   COPD with acute exacerbation ([place])   Uncontrolled type 2 diabetes mellitus with hyperglycemia ([place])   Palliative care by specialist   Goals of care, counseling/discussion   Anxiety state   Lung cancer ([place])   PNA (pneumonia)   Chronic pain syndrome   Demand ischemia ([place])    Plan: Continue current treatments.  Overall improving but slowly    LOS: 11 days   Alonza Bogus 07/07/2019, 7:55 AM

## 2019-07-07 NOTE — Progress Notes (Signed)
Pt working with pt. Pt states he can't breathe with treatment on

## 2019-07-07 NOTE — Progress Notes (Signed)
Physical Therapy Treatment Patient Details Name: Craig Owens MRN: [id number] DOB: [birth year] Today's Date: 07/07/2019    History of Present Illness Craig Owens is a 58 y.o. male with medical history of COPD, tobacco abuse, nonobstructive CAD, THC use, remote stroke presenting with acute onset of shortness of breath and chest discomfort approximately 2 hours prior to admission.  The patient denies any fevers, chills, headache, neck pain, nausea, vomiting, diarrhea, abdominal pain.  He has had a small amount of hemoptysis for 1 day.  He denies any hematemesis, hematochezia, melena.Notably, the patient states that he was told that he had "lung cancer" approximately 5 years ago by his primary care provider, but the patient was lost to follow-up and never followed through with work-up.Nevertheless, the patient continued to smoke approximately 2 packs/day for the last 40 years.  He uses THC but denies any other illegal drug use.  Upon EMS arrival, the patient was noted to be hypoxic with oxygen saturation of 80% on 6 L.  The patient was subsequently placed on nonrebreather in the emergency department with oxygen saturations 96-98%.In the emergency department, the patient was afebrile, tachycardic with HR 110-120, and hypoxic requiring a nonrebreather with oxygen saturation 98%.  CT angiogram chest showed pulmonary embolus in the bilateral distal main pulmonary artery with extension into the upper lobes and lower lobes bilateral.  There was RV strain.    PT Comments    Patient demonstrates increased endurance/distance for gait training using RW without loss of balance while on 4 LPM with SpO2 remaining between 90-91%, once returned to room and resting, SpO2 increased to 99-100% and patient left on 4 LPM - RN/RT notified.  Patient will benefit from continued physical therapy in hospital and recommended venue below to increase strength, balance, endurance for safe ADLs and gait.    Follow Up  Recommendations  Home health PT;Supervision - Intermittent     Equipment Recommendations  Other (comment)(Rollator 4 wheeled walker with seat and breaks)    Recommendations for Other Services       Precautions / Restrictions Precautions Precautions: Fall Restrictions Weight Bearing Restrictions: No    Mobility  Bed Mobility Overal bed mobility: Modified Independent             General bed mobility comments: has to use his SPC to pull himself up with head of bed raised  Transfers Overall transfer level: Needs assistance Equipment used: Rolling walker (2 wheeled) Transfers: Sit to/from [facility] Sit to Stand: Supervision;Min guard Stand pivot transfers: Supervision;Min guard       General transfer comment: slightly labored, increased time  Ambulation/Gait Ambulation/Gait assistance: Supervision Gait Distance (Feet): 35 Feet Assistive device: Rolling walker (2 wheeled) Gait Pattern/deviations: Decreased step length - right;Decreased step length - left;Decreased stride length Gait velocity: decreased   General Gait Details: demonstrates increased endurance/distance for ambulation with slow labored cadence without loss of balance, on 4 LPM with SpO2 remaining between 90-91%   Stairs             Wheelchair Mobility    Modified Rankin (Stroke Patients Only)       Balance Overall balance assessment: Needs assistance Sitting-balance support: Feet supported;No upper extremity supported Sitting balance-Leahy Scale: Good Sitting balance - Comments: seated at EOB   Standing balance support: During functional activity;Bilateral upper extremity supported Standing balance-Leahy Scale: Fair Standing balance comment: fair/good using RW  Cognition Arousal/Alertness: Awake/alert Behavior During Therapy: WFL for tasks assessed/performed Overall Cognitive Status: Within Functional Limits for tasks assessed                                         Exercises General Exercises - Lower Extremity Ankle Circles/Pumps: Seated;AROM;Strengthening;Both;10 reps Long Arc Quad: Seated;AROM;Strengthening;Both;10 reps Hip Flexion/Marching: Seated;AROM;Strengthening;Both;10 reps    General Comments        Pertinent Vitals/Pain Pain Assessment: Faces Faces Pain Scale: Hurts a little bit Pain Location: left side lower back Pain Descriptors / Indicators: Sore    Home Living                      Prior Function            PT Goals (current goals can now be found in the care plan section) Acute Rehab PT Goals Patient Stated Goal: return home PT Goal Formulation: With patient Time For Goal Achievement: 07/11/19 Potential to Achieve Goals: Fair Progress towards PT goals: Progressing toward goals    Frequency    Min 3X/week      PT Plan Current plan remains appropriate    Co-evaluation              AM-PAC PT "6 Clicks" Mobility   Outcome Measure  Help needed turning from your back to your side while in a flat bed without using bedrails?: None Help needed moving from lying on your back to sitting on the side of a flat bed without using bedrails?: None Help needed moving to and from a bed to a chair (including a wheelchair)?: A Little Help needed standing up from a chair using your arms (e.g., wheelchair or bedside chair)?: None Help needed to walk in hospital room?: A Little Help needed climbing 3-5 steps with a railing? : A Lot 6 Click Score: 20    End of Session Equipment Utilized During Treatment: Oxygen Activity Tolerance: Patient tolerated treatment well Patient left: in bed;with call bell/phone within reach(seated at bedside) Nurse Communication: Mobility status PT Visit Diagnosis: Unsteadiness on feet (R26.81);Other abnormalities of gait and mobility (R26.89);Muscle weakness (generalized) (M62.81)     Time: 3419-3790 PT Time Calculation (min)  (ACUTE ONLY): 25 min  Charges:  $Gait Training: 8-22 mins $Therapeutic Exercise: 8-22 mins                     3:21 PM, 07/07/19 Lonell Grandchild, MPT Physical Therapist with [facility] [phone] office [phone] mobile phone

## 2019-07-07 NOTE — Progress Notes (Signed)
PROGRESS NOTE    Craig Owens  UVO:[id number] DOB: 09/17/1960 DOA: 06/26/2019 PCP: Lucia Gaskins, MD   Brief Narrative:  Per HPI: Craig Owens a 58 y.o.malewith medical history ofCOPD, tobacco abuse, nonobstructive CAD, THC use, remote stroke presenting with acute onset of shortness of breath and chest discomfort approximately 2 hours prior to admission. The patient denies any fevers, chills, headache, neck pain, nausea, vomiting, diarrhea, abdominal pain. He has had a small amount of hemoptysis for 1 day. He denies any hematemesis, hematochezia, melena. Notably, the patient states that he was told that he had "lung cancer" approximately 5 years ago by his primary care provider, but the patient was lost to follow-up and never followed through with work-up. Nevertheless, the patient continued to smoke approximately 2 packs/day for the last 40 years. He uses THC but denies any other illegal drug use. Upon EMS arrival, the patient was noted to be hypoxic with oxygen saturation of 80% on 6 L. The patient was subsequently placed on nonrebreather in the emergency department with oxygen saturations 96-98%. In the emergency department, the patient was afebrile, tachycardicwith HR 110-120, and hypoxic requiring a nonrebreather with oxygen saturation 98%. CT angiogram chest showed pulmonary embolus in the bilateral distal main pulmonary artery with extension into the upper lobes and lower lobes bilateral. There was RV strain.  10/14:Patient appears to be doing well this morning with stable vital signs noted. He is actually on room air saturating near 100%. He denies any other symptoms and 2D echocardiogram is pending. We will plan to increase insulin coverage and wean steroids. Palliative consulted for goals of care discussion.  10/15:Patient continues to do well and has some minimal anxiety with hemoptysis this morning. 2D echocardiogram with LVEF 55-60%. Palliative  consultation pending. Plan to transfer to telemetry.  10/16:Patient continues to have excessive sputum output and appears congested. He is only minimally anxious, but likes to stay on his nonrebreather mask. No other acute events otherwise noted. Blood glucose levels are more stable with insulin adjustments yesterday. We will plan to add chest physiotherapy Mucomyst today.  10/19:Patient has completed course of IV heparin over the course of the weekend and will transition to Eliquis today. Plan for PT evaluation and weaning of oxygen as well as steroids. Continue IV antibiotics for 2 more days. Continue blood glucose control.  10/20:Patient has struggled on and off with use of BiPAP overnight and wants to remain on nonrebreather mask. Consulted pulmonology with plans for repeat CT chest given some new cavitary lesion seen on chest x-ray. Patient will be started on IV Unasyn with repeat CT chest. Continue Eliquis. Increase Lantus dose to 30 units daily.  10/21:Patient has refused wearing BiPAP last night and prefers nonrebreather. CT chest with findings of groundglass changes throughout with confirmation of new cavitary lesion seen in the left upper lobe. Patient remains on IV Unasyn as well as steroids. Adequate blood glucose control noted today.  10/22:Patient continues to refuse to wear BiPAP and prefers nonrebreather. Worsening leukocytosis noted this morning. He continues to demonstrate a stable clinical course on IV Unasyn.  10/23: Patient continues to have some persistent dyspnea and states that he may still require higher levels of oxygen, but has been weaned to 5 L overnight.  He continues to have persistent leukocytosis.  Plan to wean IV Solu-Medrol to oral prednisone today as he continues to remain hyperglycemic.  Continue IV Unasyn.  10/24: Patient continues to wean his oxygen, but continues to have some persistent dyspnea.  No other acute overnight events noted.   Leukocytosis still noted and patient continues on IV Unasyn.  Assessment & Plan:   Principal Problem:   Acute pulmonary embolus (HCC) Active Problems:   Tobacco abuse   Acute respiratory failure with hypoxia (HCC)   COPD with acute exacerbation (HCC)   Uncontrolled type 2 diabetes mellitus with hyperglycemia ([place])   Palliative care by specialist   Goals of care, counseling/discussion   Anxiety state   Lung cancer ([place])   PNA (pneumonia)   Chronic pain syndrome   Demand ischemia ([place])   Acute hypoxemic respiratory failure secondary to submassive PE-persistent -Case discussed with pulmonologyon admissionwho did not feel TPA currently indicated -Continue on Eliquis with anticipated lifelong therapy -Continue to monitor closely and likely transfer to telemetrytoday -2D echocardiogramwith LVEF 55-60%, normal RV size, but not well visualized -COVID testing negative -Repeat CT chestwith extensive groundglass changes noted in cavitary lesion  Acute COPD exacerbation with possible pneumonia-new cavitary lesions on chest x-ray -Continue IV Unasyn day 5/7 -Discontinue IV Solu-Medrol and switch to oral prednisone, discussed with pulmonology -Breathing treatmentsto be scheduled -Continue Pulmicort -Add Mucomyst as well as flutter valve and chest PT for congestion  Pulmonary opacities/nodules -Suspicious for cavitary lung cancer and and bony metastasis -We will have follow-up with oncology in the near future,patient would like to remain aggressive with care -Patient was apparently told he had lung cancer 5 years ago but never pursued work-up  Elevated troponin secondary to PE with RV strain -2D echocardiogram with LVEF 55-60% without significant RV issues noted -Does not appear to be clinically significant for ACS  Type 2 diabetes withhyperglycemia -Hemoglobin A1c 8.1% -Wean to oral prednisone on 10/23 and will decrease Levemir to 30 units and insulin mealtime to 10 units  3 times daily. -Continue SSI and carb modified diet and follow carefully  Remote CVA 5 years ago -Hold aspirin and Plavix while on heparin drip -Lipid panel with LDL of 53  THC and tobacco abuse -Urine drug screen with no acute findings -Discussed tobacco cessation and will consider nicotine patch as needed  Chronic pain syndrome -Patient currently on Percocet 5/325 as needed for pain control -Queried on PMP aware   DVT prophylaxis:Eliquis Code Status:Full Family Communication:None at bedside Disposition Plan:Continue current treatments. Appreciate pulmonology recommendations.  Weaning steroids today and monitoring sugar.    Up in chair and plan for disposition to home with home health PT when ready.  Wean oxygen as tolerated.   Consultants:  Palliative  Pulmonology  Procedures:  None  Antimicrobials:  Anti-infectives (From admission, onward)   Start     Dose/Rate Route Frequency Ordered Stop   07/03/19 0900  Ampicillin-Sulbactam (UNASYN) 3 g in sodium chloride 0.9 % 100 mL IVPB     3 g 200 mL/hr over 30 Minutes Intravenous Every 6 hours 07/03/19 0808     07/02/19 1115  azithromycin (ZITHROMAX) 500 mg in sodium chloride 0.9 % 250 mL IVPB     500 mg 250 mL/hr over 60 Minutes Intravenous Every 24 hours 07/02/19 0947 07/02/19 1210   07/02/19 1115  cefTRIAXone (ROCEPHIN) 2 g in sodium chloride 0.9 % 100 mL IVPB     2 g 200 mL/hr over 30 Minutes Intravenous Every 24 hours 07/02/19 0947 07/02/19 1056   06/26/19 1115  cefTRIAXone (ROCEPHIN) 2 g in sodium chloride 0.9 % 100 mL IVPB  Status:  Discontinued     2 g 200 mL/hr over 30 Minutes Intravenous Every 24 hours 06/26/19 1105 07/02/19  6767   06/26/19 1115  azithromycin (ZITHROMAX) 500 mg in sodium chloride 0.9 % 250 mL IVPB  Status:  Discontinued     500 mg 250 mL/hr over 60 Minutes Intravenous Every 24 hours 06/26/19 1105 07/02/19 0947       Subjective: Patient seen and evaluated today with no new  acute complaints or concerns. No acute concerns or events noted overnight.  Continues to have persistent dyspnea.  His nasal cannula oxygen is being weaned.  Objective: Vitals:   07/07/19 0728 07/07/19 0801 07/07/19 0900 07/07/19 1027  BP:      Pulse: 92  95 96  Resp: (!) 30  (!) 29 (!) 21  Temp: 98.7 F (37.1 C)     TempSrc: Oral     SpO2: 100% 100% 97% 100%  Weight:      Height:        Intake/Output Summary (Last 24 hours) at 07/07/2019 1055 Last data filed at 07/07/2019 0451 Gross per 24 hour  Intake 363 ml  Output 1450 ml  Net -1087 ml   Filed Weights   07/05/19 0500 07/06/19 0500 07/07/19 0450  Weight: 99.3 kg 99.8 kg 100.1 kg    Examination:  General exam: Appears calm and comfortable  Respiratory system: Clear to auscultation. Respiratory effort normal.  Continues on 6 L nasal cannula oxygen. Cardiovascular system: S1 & S2 heard, RRR. No JVD, murmurs, rubs, gallops or clicks. No pedal edema. Gastrointestinal system: Abdomen is nondistended, soft and nontender. No organomegaly or masses felt. Normal bowel sounds heard. Central nervous system: Alert and oriented. No focal neurological deficits. Extremities: Symmetric 5 x 5 power. Skin: No rashes, lesions or ulcers Psychiatry: Flat affect    Data Reviewed: I have personally reviewed following labs and imaging studies  CBC: Recent Labs  Lab 07/03/19 0413 07/04/19 0352 07/05/19 0422 07/06/19 0433 07/07/19 0438  WBC 31.4* 24.9* 33.4* 35.4* 26.6*  HGB 14.8 14.4 13.6 13.4 12.9*  HCT 47.1 46.4 44.0 42.9 41.4  MCV 86.4 86.4 86.6 86.1 86.6  PLT 212 163 153 170 209   Basic Metabolic Panel: Recent Labs  Lab 07/03/19 0413 07/04/19 0352 07/05/19 0422 07/06/19 0433 07/07/19 0438  NA 137 135 135 135 136  K 4.9 4.4 4.7 4.6 4.1  CL 102 100 101 101 101  CO2 24 25 25 24 24   GLUCOSE 292* 221* 251* 252* 182*  BUN 35* 35* 33* 32* 34*  CREATININE 0.70 0.65 0.61 0.66 0.55*  CALCIUM 9.3 9.1 9.1 9.1 8.9   GFR:  Estimated Creatinine Clearance: 122.2 mL/min (A) (by C-G formula based on SCr of 0.55 mg/dL (L)). Liver Function Tests: Recent Labs  Lab 07/02/19 0353  AST 12*  ALT 11  ALKPHOS 61  BILITOT 0.3  PROT 6.3*  ALBUMIN 2.3*   No results for input(s): LIPASE, AMYLASE in the last 168 hours. No results for input(s): AMMONIA in the last 168 hours. Coagulation Profile: No results for input(s): INR, PROTIME in the last 168 hours. Cardiac Enzymes: No results for input(s): CKTOTAL, CKMB, CKMBINDEX, TROPONINI in the last 168 hours. BNP (last 3 results) No results for input(s): PROBNP in the last 8760 hours. HbA1C: No results for input(s): HGBA1C in the last 72 hours. CBG: Recent Labs  Lab 07/06/19 0744 07/06/19 1136 07/06/19 1645 07/06/19 2119 07/07/19 0727  GLUCAP 211* 249* 231* 289* 168*   Lipid Profile: No results for input(s): CHOL, HDL, LDLCALC, TRIG, CHOLHDL, LDLDIRECT in the last 72 hours. Thyroid Function Tests: No results for input(s):  TSH, T4TOTAL, FREET4, T3FREE, THYROIDAB in the last 72 hours. Anemia Panel: No results for input(s): VITAMINB12, FOLATE, FERRITIN, TIBC, IRON, RETICCTPCT in the last 72 hours. Sepsis Labs: No results for input(s): PROCALCITON, LATICACIDVEN in the last 168 hours.  No results found for this or any previous visit (from the past 240 hour(s)).       Radiology Studies: No results found.      Scheduled Meds: . albuterol  8 puff Inhalation Once  . apixaban  10 mg Oral BID   Followed by  . [START ON 07/09/2019] apixaban  5 mg Oral BID  . budesonide (PULMICORT) nebulizer solution  0.5 mg Nebulization BID  . Chlorhexidine Gluconate Cloth  6 each Topical Daily  . feeding supplement (GLUCERNA SHAKE)  237 mL Oral TID BM  . guaiFENesin  1,200 mg Oral BID  . insulin aspart  0-20 Units Subcutaneous TID WC  . insulin aspart  0-5 Units Subcutaneous QHS  . insulin aspart  10 Units Subcutaneous TID WC  . [START ON 07/08/2019] insulin detemir  30  Units Subcutaneous Daily  . ipratropium-albuterol  3 mL Nebulization TID  . predniSONE  40 mg Oral Q breakfast  . sodium chloride flush  3 mL Intravenous Q12H  . varenicline  0.5 mg Oral BID   Continuous Infusions: . ampicillin-sulbactam (UNASYN) IV 3 g (07/07/19 0822)     LOS: 11 days    Time spent: 30 minutes    Joette Schmoker Darleen Crocker, DO [facility] Pager [phone]  If 7PM-7AM, please contact night-coverage [email or web address] Password [email or web address] 07/07/2019, 10:55 AM

## 2019-07-08 LAB — CBC
HCT: 39.4 % (ref 39.0–52.0)
Hemoglobin: 12.3 g/dL — ABNORMAL LOW (ref 13.0–17.0)
MCH: 27.1 pg (ref 26.0–34.0)
MCHC: 31.2 g/dL (ref 30.0–36.0)
MCV: 86.8 fL (ref 80.0–100.0)
Platelets: 184 10*3/uL (ref 150–400)
RBC: 4.54 MIL/uL (ref 4.22–5.81)
RDW: 14.8 % (ref 11.5–15.5)
WBC: 27.2 10*3/uL — ABNORMAL HIGH (ref 4.0–10.5)
nRBC: 0 % (ref 0.0–0.2)

## 2019-07-08 LAB — BASIC METABOLIC PANEL
Anion gap: 10 (ref 5–15)
BUN: 27 mg/dL — ABNORMAL HIGH (ref 6–20)
CO2: 25 mmol/L (ref 22–32)
Calcium: 8.7 mg/dL — ABNORMAL LOW (ref 8.9–10.3)
Chloride: 100 mmol/L (ref 98–111)
Creatinine, Ser: 0.52 mg/dL — ABNORMAL LOW (ref 0.61–1.24)
GFR calc Af Amer: >60 mL/min (ref >60)
GFR calc non Af Amer: >60 mL/min (ref >60)
Glucose, Bld: 129 mg/dL — ABNORMAL HIGH (ref 70–99)
Potassium: 3.8 mmol/L (ref 3.5–5.1)
Sodium: 135 mmol/L (ref 135–145)

## 2019-07-08 LAB — GLUCOSE, CAPILLARY
Glucose-Capillary: 151 mg/dL — ABNORMAL HIGH (ref 70–99)
Glucose-Capillary: 188 mg/dL — ABNORMAL HIGH (ref 70–99)
Glucose-Capillary: 264 mg/dL — ABNORMAL HIGH (ref 70–99)
Glucose-Capillary: 291 mg/dL — ABNORMAL HIGH (ref 70–99)

## 2019-07-08 NOTE — Progress Notes (Signed)
PROGRESS NOTE    Craig Owens  FAO:[id number] DOB: 06-24-1961 DOA: 06/26/2019 PCP: Lucia Gaskins, MD   Brief Narrative:  Per HPI: Craig Owens a 58 y.o.malewith medical history ofCOPD, tobacco abuse, nonobstructive CAD, THC use, remote stroke presenting with acute onset of shortness of breath and chest discomfort approximately 2 hours prior to admission. The patient denies any fevers, chills, headache, neck pain, nausea, vomiting, diarrhea, abdominal pain. He has had a small amount of hemoptysis for 1 day. He denies any hematemesis, hematochezia, melena. Notably, the patient states that he was told that he had "lung cancer" approximately 5 years ago by his primary care provider, but the patient was lost to follow-up and never followed through with work-up. Nevertheless, the patient continued to smoke approximately 2 packs/day for the last 40 years. He uses THC but denies any other illegal drug use. Upon EMS arrival, the patient was noted to be hypoxic with oxygen saturation of 80% on 6 L. The patient was subsequently placed on nonrebreather in the emergency department with oxygen saturations 96-98%. In the emergency department, the patient was afebrile, tachycardicwith HR 110-120, and hypoxic requiring a nonrebreather with oxygen saturation 98%. CT angiogram chest showed pulmonary embolus in the bilateral distal main pulmonary artery with extension into the upper lobes and lower lobes bilateral. There was RV strain.  10/14:Patient appears to be doing well this morning with stable vital signs noted. He is actually on room air saturating near 100%. He denies any other symptoms and 2D echocardiogram is pending. We will plan to increase insulin coverage and wean steroids. Palliative consulted for goals of care discussion.  10/15:Patient continues to do well and has some minimal anxiety with hemoptysis this morning. 2D echocardiogram with LVEF 55-60%. Palliative  consultation pending. Plan to transfer to telemetry.  10/16:Patient continues to have excessive sputum output and appears congested. He is only minimally anxious, but likes to stay on his nonrebreather mask. No other acute events otherwise noted. Blood glucose levels are more stable with insulin adjustments yesterday. We will plan to add chest physiotherapy Mucomyst today.  10/19:Patient has completed course of IV heparin over the course of the weekend and will transition to Eliquis today. Plan for PT evaluation and weaning of oxygen as well as steroids. Continue IV antibiotics for 2 more days. Continue blood glucose control.  10/20:Patient has struggled on and off with use of BiPAP overnight and wants to remain on nonrebreather mask. Consulted pulmonology with plans for repeat CT chest given some new cavitary lesion seen on chest x-ray. Patient will be started on IV Unasyn with repeat CT chest. Continue Eliquis. Increase Lantus dose to 30 units daily.  10/21:Patient has refused wearing BiPAP last night and prefers nonrebreather. CT chest with findings of groundglass changes throughout with confirmation of new cavitary lesion seen in the left upper lobe. Patient remains on IV Unasyn as well as steroids. Adequate blood glucose control noted today.  10/22:Patient continues to refuse to wear BiPAP and prefers nonrebreather. Worsening leukocytosis noted this morning. He continues to demonstrate a stable clinical course on IV Unasyn.  10/23:Patient continues to have some persistent dyspnea and states that he may still require higher levels of oxygen, but has been weaned to 5 L overnight. He continues to have persistent leukocytosis. Plan to wean IV Solu-Medrol to oral prednisone today as he continues to remain hyperglycemic. Continue IV Unasyn.  10/24: Patient continues to wean his oxygen, but continues to have some persistent dyspnea.  No other acute overnight  events noted.   Leukocytosis still noted and patient continues on IV Unasyn.  10/25: Patient remains somnolent this morning and remains on nasal cannula oxygen.  No new changes.  Assessment & Plan:   Principal Problem:   Acute pulmonary embolus (HCC) Active Problems:   Tobacco abuse   Acute respiratory failure with hypoxia (HCC)   COPD with acute exacerbation (HCC)   Uncontrolled type 2 diabetes mellitus with hyperglycemia ([place])   Palliative care by specialist   Goals of care, counseling/discussion   Anxiety state   Lung cancer ([place])   PNA (pneumonia)   Chronic pain syndrome   Demand ischemia ([place])   Acute hypoxemic respiratory failure secondary to submassive PE-persistent -Case discussed with pulmonologyon admissionwho did not feel TPA currently indicated -Continue on Eliquis with anticipated lifelong therapy -Continue to monitor closely and likely transfer to telemetrytoday -2D echocardiogramwith LVEF 55-60%, normal RV size, but not well visualized -COVID testing negative -Repeat CT chestwith extensive groundglass changes noted in cavitary lesion  Acute COPD exacerbation with possible pneumonia-new cavitary lesions on chest x-ray -Continue IV Unasyn day 6/7 -Discontinue IV Solu-Medrol and switch to oral prednisone, discussed with pulmonology -Breathing treatmentsto be scheduled -Continue Pulmicort -Add Mucomyst as well as flutter valve and chest PT for congestion  Pulmonary opacities/nodules -Suspicious for cavitary lung cancer and and bony metastasis -We will have follow-up with oncology in the near future,patient would like to remain aggressive with care -Patient was apparently told he had lung cancer 5 years ago but never pursued work-up  Elevated troponin secondary to PE with RV strain -2D echocardiogram with LVEF 55-60% without significant RV issues noted -Does not appear to be clinically significant for ACS  Type 2 diabetes withhyperglycemia -Hemoglobin A1c 8.1%  -Wean to oral prednisone on 10/23 and will decrease Levemir to 25 units and insulin mealtime to 8 units 3 times daily. -Continue SSI and carb modified diet and follow carefully  Remote CVA 5 years ago -Hold aspirin and Plavix while on heparin drip -Lipid panel with LDL of 53  THC and tobacco abuse -Urine drug screen with no acute findings -Discussed tobacco cessation and will consider nicotine patch as needed  Chronic pain syndrome -Patient currently on Percocet 5/325 as needed for pain control -Queried on PMP aware   DVT prophylaxis:Eliquis Code Status:Full Family Communication:None at bedside Disposition Plan:Continue current treatments. Appreciate pulmonology recommendations.Weaning steroids today and monitoring sugar.   Up in chair and plan for disposition to home with home health PT when ready. Wean oxygen as tolerated.   Consultants:  Palliative  Pulmonology  Procedures:  None  Antimicrobials:  Anti-infectives (From admission, onward)   Start     Dose/Rate Route Frequency Ordered Stop   07/03/19 0900  Ampicillin-Sulbactam (UNASYN) 3 g in sodium chloride 0.9 % 100 mL IVPB     3 g 200 mL/hr over 30 Minutes Intravenous Every 6 hours 07/03/19 0808     07/02/19 1115  azithromycin (ZITHROMAX) 500 mg in sodium chloride 0.9 % 250 mL IVPB     500 mg 250 mL/hr over 60 Minutes Intravenous Every 24 hours 07/02/19 0947 07/02/19 1210   07/02/19 1115  cefTRIAXone (ROCEPHIN) 2 g in sodium chloride 0.9 % 100 mL IVPB     2 g 200 mL/hr over 30 Minutes Intravenous Every 24 hours 07/02/19 0947 07/02/19 1056   06/26/19 1115  cefTRIAXone (ROCEPHIN) 2 g in sodium chloride 0.9 % 100 mL IVPB  Status:  Discontinued     2 g 200 mL/hr over  30 Minutes Intravenous Every 24 hours 06/26/19 1105 07/02/19 0947   06/26/19 1115  azithromycin (ZITHROMAX) 500 mg in sodium chloride 0.9 % 250 mL IVPB  Status:  Discontinued     500 mg 250 mL/hr over 60 Minutes Intravenous Every 24  hours 06/26/19 1105 07/02/19 0947       Subjective: Patient seen and evaluated today with no new acute complaints or concerns. No acute concerns or events noted overnight.  Patient noted to be somnolent.  Objective: Vitals:   07/08/19 0430 07/08/19 0500 07/08/19 0530 07/08/19 0727  BP:      Pulse: 94 97 95 96  Resp: (!) 32 (!) 23 (!) 25 (!) 25  Temp:    98.1 F (36.7 C)  TempSrc:    Oral  SpO2: 100% 100% 100% 100%  Weight:      Height:        Intake/Output Summary (Last 24 hours) at 07/08/2019 0754 Last data filed at 07/08/2019 0500 Gross per 24 hour  Intake 3 ml  Output 2600 ml  Net -2597 ml   Filed Weights   07/05/19 0500 07/06/19 0500 07/07/19 0450  Weight: 99.3 kg 99.8 kg 100.1 kg    Examination:  General exam: Appears calm and comfortable, somnolent Respiratory system: Clear to auscultation. Respiratory effort normal.  On 4 L nasal cannula oxygen. Cardiovascular system: S1 & S2 heard, RRR. No JVD, murmurs, rubs, gallops or clicks. No pedal edema. Gastrointestinal system: Abdomen is nondistended, soft and nontender. No organomegaly or masses felt. Normal bowel sounds heard. Central nervous system: Alert and oriented. No focal neurological deficits. Extremities: Symmetric 5 x 5 power. Skin: No rashes, lesions or ulcers Psychiatry: Flat affect    Data Reviewed: I have personally reviewed following labs and imaging studies  CBC: Recent Labs  Lab 07/04/19 0352 07/05/19 0422 07/06/19 0433 07/07/19 0438 07/08/19 0454  WBC 24.9* 33.4* 35.4* 26.6* 27.2*  HGB 14.4 13.6 13.4 12.9* 12.3*  HCT 46.4 44.0 42.9 41.4 39.4  MCV 86.4 86.6 86.1 86.6 86.8  PLT 163 153 170 164 497   Basic Metabolic Panel: Recent Labs  Lab 07/04/19 0352 07/05/19 0422 07/06/19 0433 07/07/19 0438 07/08/19 0454  NA 135 135 135 136 135  K 4.4 4.7 4.6 4.1 3.8  CL 100 101 101 101 100  CO2 25 25 24 24 25   GLUCOSE 221* 251* 252* 182* 129*  BUN 35* 33* 32* 34* 27*  CREATININE 0.65  0.61 0.66 0.55* 0.52*  CALCIUM 9.1 9.1 9.1 8.9 8.7*   GFR: Estimated Creatinine Clearance: 122.2 mL/min (A) (by C-G formula based on SCr of 0.52 mg/dL (L)). Liver Function Tests: Recent Labs  Lab 07/02/19 0353  AST 12*  ALT 11  ALKPHOS 61  BILITOT 0.3  PROT 6.3*  ALBUMIN 2.3*   No results for input(s): LIPASE, AMYLASE in the last 168 hours. No results for input(s): AMMONIA in the last 168 hours. Coagulation Profile: No results for input(s): INR, PROTIME in the last 168 hours. Cardiac Enzymes: No results for input(s): CKTOTAL, CKMB, CKMBINDEX, TROPONINI in the last 168 hours. BNP (last 3 results) No results for input(s): PROBNP in the last 8760 hours. HbA1C: No results for input(s): HGBA1C in the last 72 hours. CBG: Recent Labs  Lab 07/07/19 0727 07/07/19 1130 07/07/19 1618 07/07/19 2124 07/08/19 0726  GLUCAP 168* 162* 283* 239* 151*   Lipid Profile: No results for input(s): CHOL, HDL, LDLCALC, TRIG, CHOLHDL, LDLDIRECT in the last 72 hours. Thyroid Function Tests: No results for  input(s): TSH, T4TOTAL, FREET4, T3FREE, THYROIDAB in the last 72 hours. Anemia Panel: No results for input(s): VITAMINB12, FOLATE, FERRITIN, TIBC, IRON, RETICCTPCT in the last 72 hours. Sepsis Labs: No results for input(s): PROCALCITON, LATICACIDVEN in the last 168 hours.  No results found for this or any previous visit (from the past 240 hour(s)).       Radiology Studies: No results found.      Scheduled Meds: . albuterol  8 puff Inhalation Once  . apixaban  10 mg Oral BID   Followed by  . [START ON 07/09/2019] apixaban  5 mg Oral BID  . budesonide (PULMICORT) nebulizer solution  0.5 mg Nebulization BID  . Chlorhexidine Gluconate Cloth  6 each Topical Daily  . feeding supplement (GLUCERNA SHAKE)  237 mL Oral TID BM  . guaiFENesin  1,200 mg Oral BID  . insulin aspart  0-20 Units Subcutaneous TID WC  . insulin aspart  0-5 Units Subcutaneous QHS  . insulin aspart  10 Units  Subcutaneous TID WC  . insulin detemir  30 Units Subcutaneous Daily  . ipratropium-albuterol  3 mL Nebulization TID  . predniSONE  40 mg Oral Q breakfast  . sodium chloride flush  3 mL Intravenous Q12H  . varenicline  0.5 mg Oral BID   Continuous Infusions: . ampicillin-sulbactam (UNASYN) IV 3 g (07/08/19 0213)     LOS: 12 days    Time spent: 30 minutes    Craig Gola Darleen Crocker, DO [facility] Pager [phone]  If 7PM-7AM, please contact night-coverage [email or web address] Password [facility] 07/08/2019, 7:54 AM

## 2019-07-08 NOTE — Progress Notes (Signed)
Subjective: He is sleeping soundly this morning.  No new problems noted by the nursing staff.  Objective: Vital signs in last 24 hours: Temp:  [98.1 F (36.7 C)-99.3 F (37.4 C)] 98.1 F (36.7 C) (10/25 0400) Pulse Rate:  [83-97] 95 (10/25 0530) Resp:  [16-35] 25 (10/25 0530) BP: (113-131)/(56-79) 113/59 (10/25 0400) SpO2:  [97 %-100 %] 100 % (10/25 0530) Weight change:  Last BM Date: 07/07/19  Intake/Output from previous day: 10/24 0701 - 10/25 0700 In: 3 [I.V.:3] Out: 2600 [Urine:2600]  PHYSICAL EXAM General appearance: Sleeping.  Looks comfortable Resp: rhonchi bilaterally Cardio: regular rate and rhythm, S1, S2 normal, no murmur, click, rub or gallop GI: soft, non-tender; bowel sounds normal; no masses,  no organomegaly Extremities: extremities normal, atraumatic, no cyanosis or edema  Lab Results:  Results for orders placed or performed during the hospital encounter of 06/26/19 (from the past 48 hour(s))  Glucose, capillary     Status: Abnormal   Collection Time: 07/06/19  7:44 AM  Result Value Ref Range   Glucose-Capillary 211 (H) 70 - 99 mg/dL  Glucose, capillary     Status: Abnormal   Collection Time: 07/06/19 11:36 AM  Result Value Ref Range   Glucose-Capillary 249 (H) 70 - 99 mg/dL  Glucose, capillary     Status: Abnormal   Collection Time: 07/06/19  4:45 PM  Result Value Ref Range   Glucose-Capillary 231 (H) 70 - 99 mg/dL  Glucose, capillary     Status: Abnormal   Collection Time: 07/06/19  9:19 PM  Result Value Ref Range   Glucose-Capillary 289 (H) 70 - 99 mg/dL  CBC     Status: Abnormal   Collection Time: 07/07/19  4:38 AM  Result Value Ref Range   WBC 26.6 (H) 4.0 - 10.5 K/uL   RBC 4.78 4.22 - 5.81 MIL/uL   Hemoglobin 12.9 (L) 13.0 - 17.0 g/dL   HCT 41.4 39.0 - 52.0 %   MCV 86.6 80.0 - 100.0 fL   MCH 27.0 26.0 - 34.0 pg   MCHC 31.2 30.0 - 36.0 g/dL   RDW 14.6 11.5 - 15.5 %   Platelets 164 150 - 400 K/uL   nRBC 0.0 0.0 - 0.2 %    Comment:  Performed at [facility], [address] [place], [place] [address]  Basic metabolic panel     Status: Abnormal   Collection Time: 07/07/19  4:38 AM  Result Value Ref Range   Sodium 136 135 - 145 mmol/L   Potassium 4.1 3.5 - 5.1 mmol/L   Chloride 101 98 - 111 mmol/L   CO2 24 22 - 32 mmol/L   Glucose, Bld 182 (H) 70 - 99 mg/dL   BUN 34 (H) 6 - 20 mg/dL   Creatinine, Ser 0.55 (L) 0.61 - 1.24 mg/dL   Calcium 8.9 8.9 - 10.3 mg/dL   GFR calc non Af Amer >60 >60 mL/min   GFR calc Af Amer >60 >60 mL/min   Anion gap 11 5 - 15    Comment: Performed at [facility], [address], [place], [place] [address]  Glucose, capillary     Status: Abnormal   Collection Time: 07/07/19  7:27 AM  Result Value Ref Range   Glucose-Capillary 168 (H) 70 - 99 mg/dL  Glucose, capillary     Status: Abnormal   Collection Time: 07/07/19 11:30 AM  Result Value Ref Range   Glucose-Capillary 162 (H) 70 - 99 mg/dL  Glucose, capillary     Status:  Abnormal   Collection Time: 07/07/19  4:18 PM  Result Value Ref Range   Glucose-Capillary 283 (H) 70 - 99 mg/dL  Glucose, capillary     Status: Abnormal   Collection Time: 07/07/19  9:24 PM  Result Value Ref Range   Glucose-Capillary 239 (H) 70 - 99 mg/dL   Comment 1 Notify RN    Comment 2 Document in Chart   Basic metabolic panel     Status: Abnormal   Collection Time: 07/08/19  4:54 AM  Result Value Ref Range   Sodium 135 135 - 145 mmol/L   Potassium 3.8 3.5 - 5.1 mmol/L   Chloride 100 98 - 111 mmol/L   CO2 25 22 - 32 mmol/L   Glucose, Bld 129 (H) 70 - 99 mg/dL   BUN 27 (H) 6 - 20 mg/dL   Creatinine, Ser 0.52 (L) 0.61 - 1.24 mg/dL   Calcium 8.7 (L) 8.9 - 10.3 mg/dL   GFR calc non Af Amer >60 >60 mL/min   GFR calc Af Amer >60 >60 mL/min   Anion gap 10 5 - 15    Comment: Performed at [facility], [address], [place], [place] [address]  CBC     Status: Abnormal   Collection Time: 07/08/19  4:54 AM  Result Value Ref Range   WBC 27.2 (H) 4.0 - 10.5 K/uL    RBC 4.54 4.22 - 5.81 MIL/uL   Hemoglobin 12.3 (L) 13.0 - 17.0 g/dL   HCT 39.4 39.0 - 52.0 %   MCV 86.8 80.0 - 100.0 fL   MCH 27.1 26.0 - 34.0 pg   MCHC 31.2 30.0 - 36.0 g/dL   RDW 14.8 11.5 - 15.5 %   Platelets 184 150 - 400 K/uL   nRBC 0.0 0.0 - 0.2 %    Comment: Performed at [facility], [address], [place], [place] [address]    ABGS No results for input(s): PHART, PO2ART, TCO2, HCO3 in the last 72 hours.  Invalid input(s): PCO2 CULTURES No results found for this or any previous visit (from the past 240 hour(s)). Studies/Results: No results found.  Medications:  Prior to Admission:  Medications Prior to Admission  Medication Sig Dispense Refill Last Dose  . albuterol (PROVENTIL HFA;VENTOLIN HFA) 108 (90 BASE) MCG/ACT inhaler Inhale 2 puffs into the lungs 4 (four) times daily.    06/25/2019 at Unknown time  . aspirin 81 MG chewable tablet Chew 81 mg by mouth daily.   06/25/2019 at Unknown time  . CHANTIX 0.5 MG tablet Take 0.5 mg by mouth 2 (two) times daily.    06/25/2019 at Unknown time  . clopidogrel (PLAVIX) 75 MG tablet Take 75 mg by mouth at bedtime.    06/25/2019 at Unknown time  . INVOKANA 300 MG TABS tablet Take 300 mg by mouth daily.    06/25/2019 at Unknown time  . levofloxacin (LEVAQUIN) 750 MG tablet Take 750 mg by mouth daily. 7 day course starting on 06/21/2019   06/25/2019 at Unknown time  . oxyCODONE-acetaminophen (PERCOCET) 10-325 MG tablet Take 1 tablet by mouth every 6 (six) hours as needed for pain. (Patient taking differently: Take 1 tablet by mouth 4 (four) times daily. ) 150 tablet 0 06/25/2019 at Unknown time  . polyethylene glycol powder (GLYCOLAX/MIRALAX) powder Take 17 g by mouth daily as needed (For constipation.).    06/25/2019 at Unknown time  . pravastatin (PRAVACHOL) 40 MG tablet Take 40 mg by mouth daily.   06/25/2019 at Unknown time  .  predniSONE (DELTASONE) 20 MG tablet Take 20 mg by mouth 2 (two) times daily. 21 day course starting  06/21/2019   06/25/2019 at Unknown time  . TRULICITY 6.86 HU/8.3FG SOPN Inject 0.5 mLs into the skin once a week.    06/25/2019 at Unknown time   Scheduled: . albuterol  8 puff Inhalation Once  . apixaban  10 mg Oral BID   Followed by  . [START ON 07/09/2019] apixaban  5 mg Oral BID  . budesonide (PULMICORT) nebulizer solution  0.5 mg Nebulization BID  . Chlorhexidine Gluconate Cloth  6 each Topical Daily  . feeding supplement (GLUCERNA SHAKE)  237 mL Oral TID BM  . guaiFENesin  1,200 mg Oral BID  . insulin aspart  0-20 Units Subcutaneous TID WC  . insulin aspart  0-5 Units Subcutaneous QHS  . insulin aspart  10 Units Subcutaneous TID WC  . insulin detemir  30 Units Subcutaneous Daily  . ipratropium-albuterol  3 mL Nebulization TID  . predniSONE  40 mg Oral Q breakfast  . sodium chloride flush  3 mL Intravenous Q12H  . varenicline  0.5 mg Oral BID   Continuous: . ampicillin-sulbactam (UNASYN) IV 3 g (07/08/19 0213)   BMS:[id number] **OR** acetaminophen, ALPRAZolam, levalbuterol, ondansetron **OR** ondansetron (ZOFRAN) IV, oxyCODONE-acetaminophen **AND** oxyCODONE, polyethylene glycol, sodium chloride  Assesment: He was admitted with acute pulmonary emboli with right ventricular strain.  He now has pulmonary infarctions.  He has acute hypoxic respiratory failure and he did require BiPAP but he did not tolerate that very well.  He has cavitary lesions presumably pneumonia versus cavitation of his pulmonary infarctions and he is on antibiotics.  He is slowly improving.  At baseline he has COPD and he is on steroids and inhaled bronchodilators.  He has anxiety and he is very anxious about his breathing and that is about the same Principal Problem:   Acute pulmonary embolus (HCC) Active Problems:   Tobacco abuse   Acute respiratory failure with hypoxia ([place])   COPD with acute exacerbation ([place])   Uncontrolled type 2 diabetes mellitus with hyperglycemia ([place])   Palliative care by  specialist   Goals of care, counseling/discussion   Anxiety state   Lung cancer ([place])   PNA (pneumonia)   Chronic pain syndrome   Demand ischemia ([place])    Plan: Continue current treatments    LOS: 12 days   Alonza Bogus 07/08/2019, 7:18 AM

## 2019-07-09 LAB — BASIC METABOLIC PANEL
Anion gap: 11 (ref 5–15)
BUN: 26 mg/dL — ABNORMAL HIGH (ref 6–20)
CO2: 24 mmol/L (ref 22–32)
Calcium: 8.6 mg/dL — ABNORMAL LOW (ref 8.9–10.3)
Chloride: 101 mmol/L (ref 98–111)
Creatinine, Ser: 0.59 mg/dL — ABNORMAL LOW (ref 0.61–1.24)
GFR calc Af Amer: >60 mL/min (ref >60)
GFR calc non Af Amer: >60 mL/min (ref >60)
Glucose, Bld: 175 mg/dL — ABNORMAL HIGH (ref 70–99)
Potassium: 3.9 mmol/L (ref 3.5–5.1)
Sodium: 136 mmol/L (ref 135–145)

## 2019-07-09 LAB — CBC
HCT: 37.7 % — ABNORMAL LOW (ref 39.0–52.0)
Hemoglobin: 11.7 g/dL — ABNORMAL LOW (ref 13.0–17.0)
MCH: 26.8 pg (ref 26.0–34.0)
MCHC: 31 g/dL (ref 30.0–36.0)
MCV: 86.3 fL (ref 80.0–100.0)
Platelets: 223 10*3/uL (ref 150–400)
RBC: 4.37 MIL/uL (ref 4.22–5.81)
RDW: 14.6 % (ref 11.5–15.5)
WBC: 29 10*3/uL — ABNORMAL HIGH (ref 4.0–10.5)
nRBC: 0 % (ref 0.0–0.2)

## 2019-07-09 LAB — GLUCOSE, CAPILLARY
Glucose-Capillary: 187 mg/dL — ABNORMAL HIGH (ref 70–99)
Glucose-Capillary: 189 mg/dL — ABNORMAL HIGH (ref 70–99)
Glucose-Capillary: 266 mg/dL — ABNORMAL HIGH (ref 70–99)
Glucose-Capillary: 243 mg/dL — ABNORMAL HIGH (ref 70–99)

## 2019-07-09 NOTE — Progress Notes (Signed)
PROGRESS NOTE    Craig Owens  GYI:[id number] DOB: [birth year] DOA: 06/26/2019 PCP: Craig Gaskins, MD   Brief Narrative:  Per HPI: Craig Owens a 58 y.o.malewith medical history ofCOPD, tobacco abuse, nonobstructive CAD, THC use, remote stroke presenting with acute onset of shortness of breath and chest discomfort approximately 2 hours prior to admission. The patient denies any fevers, chills, headache, neck pain, nausea, vomiting, diarrhea, abdominal pain. He has had a small amount of hemoptysis for 1 day. He denies any hematemesis, hematochezia, melena. Notably, the patient states that he was told that he had "lung cancer" approximately 5 years ago by his primary care provider, but the patient was lost to follow-up and never followed through with work-up. Nevertheless, the patient continued to smoke approximately 2 packs/day for the last 40 years. He uses THC but denies any other illegal drug use. Upon EMS arrival, the patient was noted to be hypoxic with oxygen saturation of 80% on 6 L. The patient was subsequently placed on nonrebreather in the emergency department with oxygen saturations 96-98%. In the emergency department, the patient was afebrile, tachycardicwith HR 110-120, and hypoxic requiring a nonrebreather with oxygen saturation 98%. CT angiogram chest showed pulmonary embolus in the bilateral distal main pulmonary artery with extension into the upper lobes and lower lobes bilateral. There was RV strain.  10/14:Patient appears to be doing well this morning with stable vital signs noted. He is actually on room air saturating near 100%. He denies any other symptoms and 2D echocardiogram is pending. We will plan to increase insulin coverage and wean steroids. Palliative consulted for goals of care discussion.  10/15:Patient continues to do well and has some minimal anxiety with hemoptysis this morning. 2D echocardiogram with LVEF 55-60%. Palliative  consultation pending. Plan to transfer to telemetry.  10/16:Patient continues to have excessive sputum output and appears congested. He is only minimally anxious, but likes to stay on his nonrebreather mask. No other acute events otherwise noted. Blood glucose levels are more stable with insulin adjustments yesterday. We will plan to add chest physiotherapy Mucomyst today.  10/19:Patient has completed course of IV heparin over the course of the weekend and will transition to Eliquis today. Plan for PT evaluation and weaning of oxygen as well as steroids. Continue IV antibiotics for 2 more days. Continue blood glucose control.  10/20:Patient has struggled on and off with use of BiPAP overnight and wants to remain on nonrebreather mask. Consulted pulmonology with plans for repeat CT chest given some new cavitary lesion seen on chest x-ray. Patient will be started on IV Unasyn with repeat CT chest. Continue Eliquis. Increase Lantus dose to 30 units daily.  10/21:Patient has refused wearing BiPAP last night and prefers nonrebreather. CT chest with findings of groundglass changes throughout with confirmation of new cavitary lesion seen in the left upper lobe. Patient remains on IV Unasyn as well as steroids. Adequate blood glucose control noted today.  10/22:Patient continues to refuse to wear BiPAP and prefers nonrebreather. Worsening leukocytosis noted this morning. He continues to demonstrate a stable clinical course on IV Unasyn.  10/23:Patient continues to have some persistent dyspnea and states that he may still require higher levels of oxygen, but has been weaned to 5 L overnight. He continues to have persistent leukocytosis. Plan to wean IV Solu-Medrol to oral prednisone today as he continues to remain hyperglycemic. Continue IV Unasyn.  10/24:Patient continues to wean his oxygen, but continues to have some persistent dyspnea. No other acute overnight events noted.  Leukocytosis still noted and patient continues on IV Unasyn.  10/25: Patient remains somnolent this morning and remains on nasal cannula oxygen.  No new changes.  10/26: Patient is on 4 L nasal cannula oxygen.  He has 1 more day of Unasyn to complete course of treatment and has appeared to have reached maximum medical benefit.  No acute overnight events noted.  Anticipate discharge in a.m. if stable.  Assessment & Plan:   Principal Problem:   Acute pulmonary embolus (HCC) Active Problems:   Tobacco abuse   Acute respiratory failure with hypoxia (HCC)   COPD with acute exacerbation (HCC)   Uncontrolled type 2 diabetes mellitus with hyperglycemia ([place])   Palliative care by specialist   Goals of care, counseling/discussion   Anxiety state   Lung cancer ([place])   PNA (pneumonia)   Chronic pain syndrome   Demand ischemia ([place])   Acute hypoxemic respiratory failure secondary to submassive PE-persistent -Case discussed with pulmonologyon admissionwho did not feel TPA currently indicated -Continue on Eliquis with anticipated lifelong therapy -Continue to monitor closely and likely transfer to telemetrytoday -2D echocardiogramwith LVEF 55-60%, normal RV size, but not well visualized -COVID testing negative -Repeat CT chestwith extensive groundglass changes noted in cavitary lesion  Acute COPD exacerbation with possible pneumonia-new cavitary lesions on chest x-ray -Continue IV Unasynday 7/7, plan to discontinue tomorrow -Continue on oral prednisone for now -Breathing treatmentsto be scheduled -Continue Pulmicort -Add Mucomyst as well as flutter valve and chest PT for congestion  Pulmonary opacities/nodules -Suspicious for cavitary lung cancer and and bony metastasis -We will have follow-up with oncology in the near future,patient would like to remain aggressive with care -Patient was apparently told he had lung cancer 5 years ago but never pursued work-up  Elevated  troponin secondary to PE with RV strain -2D echocardiogram with LVEF 55-60% without significant RV issues noted -Does not appear to be clinically significant for ACS  Type 2 diabetes withhyperglycemia -Hemoglobin A1c 8.1% -Wean to oral prednisone on 10/23 and will decrease Levemir to 25 units and insulin mealtime to 8 units 3 times daily. -Continue SSI and carb modified diet and follow carefully  Remote CVA 5 years ago -Hold aspirin and Plavix while on heparin drip -Lipid panel with LDL of 53  THC and tobacco abuse -Urine drug screen with no acute findings -Discussed tobacco cessation and will consider nicotine patch as needed  Chronic pain syndrome -Patient currently on Percocet 5/325 as needed for pain control -Queried on PMP aware   DVT prophylaxis:Eliquis Code Status:Full Family Communication:None at bedside Disposition Plan:Continue current treatments. Appreciate pulmonology recommendations.Plan to finish treatment of pneumonia today with Unasyn.  Anticipate discharge in a.m. with home oxygen and home health PT.   Consultants:  Palliative  Pulmonology  Procedures:  None  Antimicrobials:  Anti-infectives (From admission, onward)   Start     Dose/Rate Route Frequency Ordered Stop   07/03/19 0900  Ampicillin-Sulbactam (UNASYN) 3 g in sodium chloride 0.9 % 100 mL IVPB     3 g 200 mL/hr over 30 Minutes Intravenous Every 6 hours 07/03/19 0808     07/02/19 1115  azithromycin (ZITHROMAX) 500 mg in sodium chloride 0.9 % 250 mL IVPB     500 mg 250 mL/hr over 60 Minutes Intravenous Every 24 hours 07/02/19 0947 07/02/19 1210   07/02/19 1115  cefTRIAXone (ROCEPHIN) 2 g in sodium chloride 0.9 % 100 mL IVPB     2 g 200 mL/hr over 30 Minutes Intravenous Every 24 hours 07/02/19  7425 07/02/19 1056   06/26/19 1115  cefTRIAXone (ROCEPHIN) 2 g in sodium chloride 0.9 % 100 mL IVPB  Status:  Discontinued     2 g 200 mL/hr over 30 Minutes Intravenous Every 24 hours  06/26/19 1105 07/02/19 0947   06/26/19 1115  azithromycin (ZITHROMAX) 500 mg in sodium chloride 0.9 % 250 mL IVPB  Status:  Discontinued     500 mg 250 mL/hr over 60 Minutes Intravenous Every 24 hours 06/26/19 1105 07/02/19 0947        Subjective: Patient seen and evaluated today with no new acute complaints or concerns. No acute concerns or events noted overnight.  Objective: Vitals:   07/09/19 0811 07/09/19 1000 07/09/19 1046 07/09/19 1124  BP:  121/70    Pulse:  98    Resp:   20   Temp: 98 F (36.7 C)   98.1 F (36.7 C)  TempSrc: Axillary   Axillary  SpO2:   95%   Weight:      Height:        Intake/Output Summary (Last 24 hours) at 07/09/2019 1338 Last data filed at 07/09/2019 0948 Gross per 24 hour  Intake 483 ml  Output 1575 ml  Net -1092 ml   Filed Weights   07/06/19 0500 07/07/19 0450 07/09/19 0500  Weight: 99.8 kg 100.1 kg 97.6 kg    Examination:  General exam: Appears calm and comfortable  Respiratory system: Clear to auscultation. Respiratory effort normal. 4L [place] oxygen. Cardiovascular system: S1 & S2 heard, RRR. No JVD, murmurs, rubs, gallops or clicks. No pedal edema. Gastrointestinal system: Abdomen is nondistended, soft and nontender. No organomegaly or masses felt. Normal bowel sounds heard. Central nervous system: Alert and oriented. No focal neurological deficits. Extremities: Symmetric 5 x 5 power. Skin: No rashes, lesions or ulcers Psychiatry: Flat affect.    Data Reviewed: I have personally reviewed following labs and imaging studies  CBC: Recent Labs  Lab 07/05/19 0422 07/06/19 0433 07/07/19 0438 07/08/19 0454 07/09/19 0423  WBC 33.4* 35.4* 26.6* 27.2* 29.0*  HGB 13.6 13.4 12.9* 12.3* 11.7*  HCT 44.0 42.9 41.4 39.4 37.7*  MCV 86.6 86.1 86.6 86.8 86.3  PLT 153 170 164 184 956   Basic Metabolic Panel: Recent Labs  Lab 07/05/19 0422 07/06/19 0433 07/07/19 0438 07/08/19 0454 07/09/19 0423  NA 135 135 136 135 136  K 4.7 4.6  4.1 3.8 3.9  CL 101 101 101 100 101  CO2 25 24 24 25 24   GLUCOSE 251* 252* 182* 129* 175*  BUN 33* 32* 34* 27* 26*  CREATININE 0.61 0.66 0.55* 0.52* 0.59*  CALCIUM 9.1 9.1 8.9 8.7* 8.6*   GFR: Estimated Creatinine Clearance: 120.8 mL/min (A) (by C-G formula based on SCr of 0.59 mg/dL (L)). Liver Function Tests: No results for input(s): AST, ALT, ALKPHOS, BILITOT, PROT, ALBUMIN in the last 168 hours. No results for input(s): LIPASE, AMYLASE in the last 168 hours. No results for input(s): AMMONIA in the last 168 hours. Coagulation Profile: No results for input(s): INR, PROTIME in the last 168 hours. Cardiac Enzymes: No results for input(s): CKTOTAL, CKMB, CKMBINDEX, TROPONINI in the last 168 hours. BNP (last 3 results) No results for input(s): PROBNP in the last 8760 hours. HbA1C: No results for input(s): HGBA1C in the last 72 hours. CBG: Recent Labs  Lab 07/08/19 1134 07/08/19 1621 07/08/19 2119 07/09/19 0813 07/09/19 1115  GLUCAP 188* 264* 291* 187* 189*   Lipid Profile: No results for input(s): CHOL, HDL, LDLCALC, TRIG, CHOLHDL,  LDLDIRECT in the last 72 hours. Thyroid Function Tests: No results for input(s): TSH, T4TOTAL, FREET4, T3FREE, THYROIDAB in the last 72 hours. Anemia Panel: No results for input(s): VITAMINB12, FOLATE, FERRITIN, TIBC, IRON, RETICCTPCT in the last 72 hours. Sepsis Labs: No results for input(s): PROCALCITON, LATICACIDVEN in the last 168 hours.  No results found for this or any previous visit (from the past 240 hour(s)).       Radiology Studies: No results found.      Scheduled Meds: . albuterol  8 puff Inhalation Once  . apixaban  5 mg Oral BID  . budesonide (PULMICORT) nebulizer solution  0.5 mg Nebulization BID  . Chlorhexidine Gluconate Cloth  6 each Topical Daily  . feeding supplement (GLUCERNA SHAKE)  237 mL Oral TID BM  . guaiFENesin  1,200 mg Oral BID  . insulin aspart  0-20 Units Subcutaneous TID WC  . insulin aspart  0-5  Units Subcutaneous QHS  . insulin aspart  10 Units Subcutaneous TID WC  . insulin detemir  30 Units Subcutaneous Daily  . ipratropium-albuterol  3 mL Nebulization TID  . predniSONE  40 mg Oral Q breakfast  . sodium chloride flush  3 mL Intravenous Q12H  . varenicline  0.5 mg Oral BID   Continuous Infusions: . ampicillin-sulbactam (UNASYN) IV 3 g (07/09/19 0945)     LOS: 13 days    Time spent: 30 minutes    Boden Stucky Darleen Crocker, DO [facility] Pager [phone]  If 7PM-7AM, please contact night-coverage [email or web address] Password [email or web address] 07/09/2019, 1:38 PM

## 2019-07-09 NOTE — Progress Notes (Signed)
Subjective: He says he feels okay.  No new complaints.  He is still short of breath.  He is on oxygen by nasal cannula with good oxygenation on 4 liters  Objective: Vital signs in last 24 hours: Temp:  [97.8 F (36.6 C)-99 F (37.2 C)] 98.9 F (37.2 C) (10/26 0400) Pulse Rate:  [85-100] 92 (10/26 0400) Resp:  [19-35] 35 (10/26 0400) BP: (108-127)/(62-94) 124/66 (10/26 0400) SpO2:  [86 %-100 %] 91 % (10/26 0400) Weight:  [97.6 kg] 97.6 kg (10/26 0500) Weight change:  Last BM Date: 07/07/19  Intake/Output from previous day: 10/25 0701 - 10/26 0700 In: 483 [P.O.:477; I.V.:6] Out: 650 [Urine:650]  PHYSICAL EXAM General appearance: alert, cooperative and no distress Resp: rhonchi bilaterally Cardio: regular rate and rhythm, S1, S2 normal, no murmur, click, rub or gallop GI: soft, non-tender; bowel sounds normal; no masses,  no organomegaly Extremities: extremities normal, atraumatic, no cyanosis or edema  Lab Results:  Results for orders placed or performed during the hospital encounter of 06/26/19 (from the past 48 hour(s))  Glucose, capillary     Status: Abnormal   Collection Time: 07/07/19 11:30 AM  Result Value Ref Range   Glucose-Capillary 162 (H) 70 - 99 mg/dL  Glucose, capillary     Status: Abnormal   Collection Time: 07/07/19  4:18 PM  Result Value Ref Range   Glucose-Capillary 283 (H) 70 - 99 mg/dL  Glucose, capillary     Status: Abnormal   Collection Time: 07/07/19  9:24 PM  Result Value Ref Range   Glucose-Capillary 239 (H) 70 - 99 mg/dL   Comment 1 Notify RN    Comment 2 Document in Chart   Basic metabolic panel     Status: Abnormal   Collection Time: 07/08/19  4:54 AM  Result Value Ref Range   Sodium 135 135 - 145 mmol/L   Potassium 3.8 3.5 - 5.1 mmol/L   Chloride 100 98 - 111 mmol/L   CO2 25 22 - 32 mmol/L   Glucose, Bld 129 (H) 70 - 99 mg/dL   BUN 27 (H) 6 - 20 mg/dL   Creatinine, Ser 0.52 (L) 0.61 - 1.24 mg/dL   Calcium 8.7 (L) 8.9 - 10.3 mg/dL   GFR calc non Af Amer >60 >60 mL/min   GFR calc Af Amer >60 >60 mL/min   Anion gap 10 5 - 15    Comment: Performed at [facility], [address] [place], [place] [address]  CBC     Status: Abnormal   Collection Time: 07/08/19  4:54 AM  Result Value Ref Range   WBC 27.2 (H) 4.0 - 10.5 K/uL   RBC 4.54 4.22 - 5.81 MIL/uL   Hemoglobin 12.3 (L) 13.0 - 17.0 g/dL   HCT 39.4 39.0 - 52.0 %   MCV 86.8 80.0 - 100.0 fL   MCH 27.1 26.0 - 34.0 pg   MCHC 31.2 30.0 - 36.0 g/dL   RDW 14.8 11.5 - 15.5 %   Platelets 184 150 - 400 K/uL   nRBC 0.0 0.0 - 0.2 %    Comment: Performed at [facility], [address], [place], [place] [address]  Glucose, capillary     Status: Abnormal   Collection Time: 07/08/19  7:26 AM  Result Value Ref Range   Glucose-Capillary 151 (H) 70 - 99 mg/dL  Glucose, capillary     Status: Abnormal   Collection Time: 07/08/19 11:34 AM  Result Value Ref Range   Glucose-Capillary 188 (H) 70 - 99  mg/dL  Glucose, capillary     Status: Abnormal   Collection Time: 07/08/19  4:21 PM  Result Value Ref Range   Glucose-Capillary 264 (H) 70 - 99 mg/dL  Glucose, capillary     Status: Abnormal   Collection Time: 07/08/19  9:19 PM  Result Value Ref Range   Glucose-Capillary 291 (H) 70 - 99 mg/dL   Comment 1 Notify RN    Comment 2 Document in Chart   CBC     Status: Abnormal   Collection Time: 07/09/19  4:23 AM  Result Value Ref Range   WBC 29.0 (H) 4.0 - 10.5 K/uL   RBC 4.37 4.22 - 5.81 MIL/uL   Hemoglobin 11.7 (L) 13.0 - 17.0 g/dL   HCT 37.7 (L) 39.0 - 52.0 %   MCV 86.3 80.0 - 100.0 fL   MCH 26.8 26.0 - 34.0 pg   MCHC 31.0 30.0 - 36.0 g/dL   RDW 14.6 11.5 - 15.5 %   Platelets 223 150 - 400 K/uL   nRBC 0.0 0.0 - 0.2 %    Comment: Performed at [facility], [address] [place], [place] [address]  Basic metabolic panel     Status: Abnormal   Collection Time: 07/09/19  4:23 AM  Result Value Ref Range   Sodium 136 135 - 145 mmol/L   Potassium 3.9 3.5 - 5.1 mmol/L   Chloride  101 98 - 111 mmol/L   CO2 24 22 - 32 mmol/L   Glucose, Bld 175 (H) 70 - 99 mg/dL   BUN 26 (H) 6 - 20 mg/dL   Creatinine, Ser 0.59 (L) 0.61 - 1.24 mg/dL   Calcium 8.6 (L) 8.9 - 10.3 mg/dL   GFR calc non Af Amer >60 >60 mL/min   GFR calc Af Amer >60 >60 mL/min   Anion gap 11 5 - 15    Comment: Performed at [facility], [address], [place], [place] [address]    ABGS No results for input(s): PHART, PO2ART, TCO2, HCO3 in the last 72 hours.  Invalid input(s): PCO2 CULTURES No results found for this or any previous visit (from the past 240 hour(s)). Studies/Results: No results found.  Medications:  Prior to Admission:  Medications Prior to Admission  Medication Sig Dispense Refill Last Dose  . albuterol (PROVENTIL HFA;VENTOLIN HFA) 108 (90 BASE) MCG/ACT inhaler Inhale 2 puffs into the lungs 4 (four) times daily.    06/25/2019 at Unknown time  . aspirin 81 MG chewable tablet Chew 81 mg by mouth daily.   06/25/2019 at Unknown time  . CHANTIX 0.5 MG tablet Take 0.5 mg by mouth 2 (two) times daily.    06/25/2019 at Unknown time  . clopidogrel (PLAVIX) 75 MG tablet Take 75 mg by mouth at bedtime.    06/25/2019 at Unknown time  . INVOKANA 300 MG TABS tablet Take 300 mg by mouth daily.    06/25/2019 at Unknown time  . levofloxacin (LEVAQUIN) 750 MG tablet Take 750 mg by mouth daily. 7 day course starting on 06/21/2019   06/25/2019 at Unknown time  . oxyCODONE-acetaminophen (PERCOCET) 10-325 MG tablet Take 1 tablet by mouth every 6 (six) hours as needed for pain. (Patient taking differently: Take 1 tablet by mouth 4 (four) times daily. ) 150 tablet 0 06/25/2019 at Unknown time  . polyethylene glycol powder (GLYCOLAX/MIRALAX) powder Take 17 g by mouth daily as needed (For constipation.).    06/25/2019 at Unknown time  . pravastatin (PRAVACHOL) 40 MG tablet Take 40 mg  by mouth daily.   06/25/2019 at Unknown time  . predniSONE (DELTASONE) 20 MG tablet Take 20 mg by mouth 2 (two) times daily. 21  day course starting 06/21/2019   06/25/2019 at Unknown time  . TRULICITY 1.01 BP/1.0CH SOPN Inject 0.5 mLs into the skin once a week.    06/25/2019 at Unknown time   Scheduled: . albuterol  8 puff Inhalation Once  . apixaban  5 mg Oral BID  . budesonide (PULMICORT) nebulizer solution  0.5 mg Nebulization BID  . Chlorhexidine Gluconate Cloth  6 each Topical Daily  . feeding supplement (GLUCERNA SHAKE)  237 mL Oral TID BM  . guaiFENesin  1,200 mg Oral BID  . insulin aspart  0-20 Units Subcutaneous TID WC  . insulin aspart  0-5 Units Subcutaneous QHS  . insulin aspart  10 Units Subcutaneous TID WC  . insulin detemir  30 Units Subcutaneous Daily  . ipratropium-albuterol  3 mL Nebulization TID  . predniSONE  40 mg Oral Q breakfast  . sodium chloride flush  3 mL Intravenous Q12H  . varenicline  0.5 mg Oral BID   Continuous: . ampicillin-sulbactam (UNASYN) IV 3 g (07/09/19 0202)   ENI:DPOEUMPNTIRWE **OR** acetaminophen, ALPRAZolam, levalbuterol, ondansetron **OR** ondansetron (ZOFRAN) IV, oxyCODONE-acetaminophen **AND** oxyCODONE, polyethylene glycol, sodium chloride  Assesment: He was admitted with acute shortness of breath and ended up having acute pulmonary emboli with right ventricular strain.  By CT he has pulmonary infarctions.  He has areas of look like pneumonia and with some cavity which may be related to pulmonary infarction may be cavitary pneumonia may be related to his presumed diagnosis of lung cancer.  He had been requiring nonrebreather mask but he is now on 4 L with good oxygenation.  He is on Unasyn and will finish 7 days today.  He has COPD at baseline and he is on steroids.  He has elevated white blood cell count which may be related to steroids but is not totally clear.  He has diabetes on sliding scale  He has been smoking until this admission Principal Problem:   Acute pulmonary embolus (HCC) Active Problems:   Tobacco abuse   Acute respiratory failure with  hypoxia (HCC)   COPD with acute exacerbation ([place])   Uncontrolled type 2 diabetes mellitus with hyperglycemia ([place])   Palliative care by specialist   Goals of care, counseling/discussion   Anxiety state   Lung cancer ([place])   PNA (pneumonia)   Chronic pain syndrome   Demand ischemia ([place])    Plan: Continue treatments.  He seems to be approaching maximum hospital benefit.  Discussed with Dr. Manuella Ghazi hospitalist attending    LOS: 13 days   Alonza Bogus 07/09/2019, 7:33 AM

## 2019-07-09 NOTE — TOC Progression Note (Signed)
Transition of Care [facility]) - Progression Note    Patient Details  Name: Craig Owens MRN: [id number] Date of Birth: 10-29-1960  Transition of Care [facility]) CM/SW Contact  Shade Flood, LCSW Phone Number: 07/09/2019, 2:32 PM  Clinical Narrative:     TOC following. Pt status discussed in Progression. PT recommending HH PT. Pt is agreeable. Referred to [facility] as they are the in network provider for [employer]. Awaiting return call from [place] at [place] to confirm they will accept pt.  Anticipating dc home tomorrow. Will follow up in AM.  Expected Discharge Plan: [place] Barriers to Discharge: Continued Medical Work up  Expected Discharge Plan and Services Expected Discharge Plan: [place] arrangements for the past 2 months: Single Family Home                           HH Arranged: PT [place]: [facility] (formerly [employer]) Date [place]: 07/09/19 Time [place]: [place] Representative spoke with at [place]: [place] ([place]) Interventions    Readmission Risk Interventions No flowsheet data found.

## 2019-07-09 NOTE — Care Management Important Message (Signed)
Important Message  Patient Details  Name: Craig Owens MRN: [id number] Date of Birth: 06-02-1961   Medicare Important Message Given:  Yes     [name] 07/09/2019, 1:37 PM

## 2019-07-10 LAB — CBC
HCT: 34.8 % — ABNORMAL LOW (ref 39.0–52.0)
Hemoglobin: 10.7 g/dL — ABNORMAL LOW (ref 13.0–17.0)
MCH: 26.8 pg (ref 26.0–34.0)
MCHC: 30.7 g/dL (ref 30.0–36.0)
MCV: 87.2 fL (ref 80.0–100.0)
Platelets: 257 10*3/uL (ref 150–400)
RBC: 3.99 MIL/uL — ABNORMAL LOW (ref 4.22–5.81)
RDW: 15 % (ref 11.5–15.5)
WBC: 27.4 10*3/uL — ABNORMAL HIGH (ref 4.0–10.5)
nRBC: 0 % (ref 0.0–0.2)

## 2019-07-10 LAB — GLUCOSE, CAPILLARY
Glucose-Capillary: 119 mg/dL — ABNORMAL HIGH (ref 70–99)
Glucose-Capillary: 94 mg/dL (ref 70–99)

## 2019-07-10 LAB — BASIC METABOLIC PANEL
Anion gap: 11 (ref 5–15)
BUN: 23 mg/dL — ABNORMAL HIGH (ref 6–20)
CO2: 22 mmol/L (ref 22–32)
Calcium: 8.3 mg/dL — ABNORMAL LOW (ref 8.9–10.3)
Chloride: 102 mmol/L (ref 98–111)
Creatinine, Ser: 0.61 mg/dL (ref 0.61–1.24)
GFR calc Af Amer: >60 mL/min (ref >60)
GFR calc non Af Amer: >60 mL/min (ref >60)
Glucose, Bld: 130 mg/dL — ABNORMAL HIGH (ref 70–99)
Potassium: 3.7 mmol/L (ref 3.5–5.1)
Sodium: 135 mmol/L (ref 135–145)

## 2019-07-10 MED ORDER — ALPRAZOLAM 0.25 MG PO TABS: 0.2500 mg | ORAL_TABLET | Freq: Three times a day (TID) | ORAL | 0 refills | Status: AC | PRN

## 2019-07-10 MED ORDER — APIXABAN 5 MG PO TABS: 5.0000 mg | ORAL_TABLET | Freq: Two times a day (BID) | ORAL | 3 refills | Status: AC

## 2019-07-10 MED ORDER — OXYCODONE-ACETAMINOPHEN 5-325 MG PO TABS: 1.0000 | ORAL_TABLET | Freq: Four times a day (QID) | ORAL | 0 refills | Status: AC | PRN

## 2019-07-10 MED ORDER — IPRATROPIUM-ALBUTEROL 0.5-2.5 (3) MG/3ML IN SOLN: 3.0000 mL | Freq: Four times a day (QID) | RESPIRATORY_TRACT | 3 refills | Status: AC | PRN

## 2019-07-10 MED ORDER — GUAIFENESIN ER 600 MG PO TB12: 1200.0000 mg | ORAL_TABLET | Freq: Two times a day (BID) | ORAL | 0 refills | Status: AC

## 2019-07-10 NOTE — Discharge Summary (Signed)
Physician Discharge Summary  Craig Owens OJJ:[id number] DOB: [birth year] DOA: 06/26/2019  PCP: Lucia Gaskins, MD  Admit date: 06/26/2019  Discharge date: 07/10/2019  Admitted From:Home  Disposition:  Home  Recommendations for Outpatient Follow-up:  1. Follow up with PCP in 1-2 weeks 2. Follow-up with Dr. Luan Pulling in 2 weeks 3. Follow-up with Dr. Delton Coombes in 2 weeks 4. Continue on Eliquis as prescribed for anticoagulation 5. Nebulizer machine with breathing treatments provided on discharge  Home Health: Yes with PT  Equipment/Devices: Home 4 L nasal cannula oxygen and 4 wheeled walker  Discharge Condition: Stable  CODE STATUS: Full  Diet recommendation: Heart Healthy  Brief/Interim Summary: Per HPI: Craig Owens a 58 y.o.malewith medical history ofCOPD, tobacco abuse, nonobstructive CAD, THC use, remote stroke presenting with acute onset of shortness of breath and chest discomfort approximately 2 hours prior to admission. The patient denies any fevers, chills, headache, neck pain, nausea, vomiting, diarrhea, abdominal pain. He has had a small amount of hemoptysis for 1 day. He denies any hematemesis, hematochezia, melena. Notably, the patient states that he was told that he had "lung cancer" approximately 5 years ago by his primary care provider, but the patient was lost to follow-up and never followed through with work-up. Nevertheless, the patient continued to smoke approximately 2 packs/day for the last 40 years. He uses THC but denies any other illegal drug use. Upon EMS arrival, the patient was noted to be hypoxic with oxygen saturation of 80% on 6 L. The patient was subsequently placed on nonrebreather in the emergency department with oxygen saturations 96-98%. In the emergency department, the patient was afebrile, tachycardicwith HR 110-120, and hypoxic requiring a nonrebreather with oxygen saturation 98%. CT angiogram chest showed pulmonary embolus in  the bilateral distal main pulmonary artery with extension into the upper lobes and lower lobes bilateral. There was RV strain.  10/14:Patient appears to be doing well this morning with stable vital signs noted. He is actually on room air saturating near 100%. He denies any other symptoms and 2D echocardiogram is pending. We will plan to increase insulin coverage and wean steroids. Palliative consulted for goals of care discussion.  10/15:Patient continues to do well and has some minimal anxiety with hemoptysis this morning. 2D echocardiogram with LVEF 55-60%. Palliative consultation pending. Plan to transfer to telemetry.  10/16:Patient continues to have excessive sputum output and appears congested. He is only minimally anxious, but likes to stay on his nonrebreather mask. No other acute events otherwise noted. Blood glucose levels are more stable with insulin adjustments yesterday. We will plan to add chest physiotherapy [place] today.  10/19:Patient has completed course of IV heparin over the course of the weekend and will transition to Eliquis today. Plan for PT evaluation and weaning of oxygen as well as steroids. Continue IV antibiotics for 2 more days. Continue blood glucose control.  10/20:Patient has struggled on and off with use of BiPAP overnight and wants to remain on nonrebreather mask. Consulted pulmonology with plans for repeat CT chest given some new cavitary lesion seen on chest x-ray. Patient will be started on IV Unasyn with repeat CT chest. Continue Eliquis. Increase Lantus dose to 30 units daily.  10/21:Patient has refused wearing BiPAP last night and prefers nonrebreather. CT chest with findings of groundglass changes throughout with confirmation of new cavitary lesion seen in the left upper lobe. Patient remains on IV Unasyn as well as steroids. Adequate blood glucose control noted today.  10/22:Patient continues to refuse to wear BiPAP  and  prefers nonrebreather. Worsening leukocytosis noted this morning. He continues to demonstrate a stable clinical course on IV Unasyn.  10/23:Patient continues to have some persistent dyspnea and states that he may still require higher levels of oxygen, but has been weaned to 5 L overnight. He continues to have persistent leukocytosis. Plan to wean IV Solu-Medrol to oral prednisone today as he continues to remain hyperglycemic. Continue IV Unasyn.  10/24:Patient continues to wean his oxygen, but continues to have some persistent dyspnea. No other acute overnight events noted. Leukocytosis still noted and patient continues on IV Unasyn.  10/25:Patient remains somnolent this morning and remains on nasal cannula oxygen. No new changes.  10/26: Patient is on 4 L nasal cannula oxygen.  He has 1 more day of Unasyn to complete course of treatment and has appeared to have reached maximum medical benefit.  No acute overnight events noted.  Anticipate discharge in a.m. if stable.  10/27: Patient has completed course of treatment with Unasyn and is stable for discharge today.  He will require home 4 L nasal cannula oxygen as well as home health PT and Eliquis.  He will need close follow-up with pulmonology as well as oncology.  He will remain off prednisone at this time and resume his usual home diabetic medications of [place] and Invokana.  He will be given home nebulizer machine as well as treatments as needed.  Discharge Diagnoses:  Principal Problem:   Acute pulmonary embolus (HCC) Active Problems:   Tobacco abuse   Acute respiratory failure with hypoxia (HCC)   COPD with acute exacerbation ([place])   Uncontrolled type 2 diabetes mellitus with hyperglycemia ([place])   Palliative care by specialist   Goals of care, counseling/discussion   Anxiety state   Lung cancer ([place])   PNA (pneumonia)   Chronic pain syndrome   Demand ischemia ([place])  Principal discharge diagnosis: Acute hypoxemic  respiratory failure secondary to submassive PE along with acute COPD exacerbation with multifocal pneumonia.  Discharge Instructions  Discharge Instructions    Diet - low sodium heart healthy   Complete by: As directed    Increase activity slowly   Complete by: As directed      Allergies as of 07/10/2019      Reactions   Adhesive [tape] Other (See Comments)   Blisters skin, peels off. Please use paper tape.    Codeine Hives, Itching, Swelling   Latex Hives      Medication List    STOP taking these medications   albuterol 108 (90 Base) MCG/ACT inhaler Commonly known as: VENTOLIN HFA   levofloxacin 750 MG tablet Commonly known as: LEVAQUIN   oxyCODONE-acetaminophen 10-325 MG tablet Commonly known as: PERCOCET Replaced by: oxyCODONE-acetaminophen 5-325 MG tablet   predniSONE 20 MG tablet Commonly known as: DELTASONE     TAKE these medications   ALPRAZolam 0.25 MG tablet Commonly known as: XANAX Take 1 tablet (0.25 mg total) by mouth 3 (three) times daily as needed for anxiety.   apixaban 5 MG Tabs tablet Commonly known as: ELIQUIS Take 1 tablet (5 mg total) by mouth 2 (two) times daily.   aspirin 81 MG chewable tablet Chew 81 mg by mouth daily.   Chantix 0.5 MG tablet Generic drug: varenicline Take 0.5 mg by mouth 2 (two) times daily.   clopidogrel 75 MG tablet Commonly known as: PLAVIX Take 75 mg by mouth at bedtime.   guaiFENesin 600 MG 12 hr tablet Commonly known as: MUCINEX Take 2 tablets (1,200 mg total)  by mouth 2 (two) times daily.   Invokana 300 MG Tabs tablet Generic drug: canagliflozin Take 300 mg by mouth daily.   ipratropium-albuterol 0.5-2.5 (3) MG/3ML Soln Commonly known as: DUONEB Take 3 mLs by nebulization every 6 (six) hours as needed (dyspnea or wheezing).   oxyCODONE-acetaminophen 5-325 MG tablet Commonly known as: PERCOCET/ROXICET Take 1 tablet by mouth every 6 (six) hours as needed for moderate pain or severe pain. Replaces:  oxyCODONE-acetaminophen 10-325 MG tablet   polyethylene glycol powder 17 GM/SCOOP powder Commonly known as: GLYCOLAX/MIRALAX Take 17 g by mouth daily as needed (For constipation.).   pravastatin 40 MG tablet Commonly known as: PRAVACHOL Take 40 mg by mouth daily.   Trulicity 0.16 WF/0.9NA Sopn Generic drug: Dulaglutide Inject 0.5 mLs into the skin once a week.            Durable Medical Equipment  (From admission, onward)         Start     Ordered   07/10/19 0928  For home use only DME Nebulizer machine  Once    Question Answer Comment  Patient needs a nebulizer to treat with the following condition COPD (chronic obstructive pulmonary disease) ([place])   Length of Need Lifetime      07/10/19 0927   07/10/19 0927  DME Oxygen  Once    Question Answer Comment  Length of Need Lifetime   Mode or (Route) Nasal cannula   Liters per Minute 4   Frequency Continuous (stationary and portable oxygen unit needed)   Oxygen conserving device Yes   Oxygen delivery system Gas      07/10/19 0927   07/10/19 0926  For home use only DME 4 wheeled rolling walker with seat  (Walkers)  Once    Question:  Patient needs a walker to treat with the following condition  Answer:  Weakness   07/10/19 0927         Follow-up Information    Lucia Gaskins, MD Follow up in 1 week(s).   Specialty: Internal Medicine Contact information: [place] [address] [phone]        Sinda Du, MD Follow up in 2 week(s).   Specialty: Pulmonary Disease Contact information: [address] [place] [address] [phone]        Derek Jack, MD Follow up in 2 week(s).   Specialty: Hematology Contact information: [place] [address] [phone]          Allergies  Allergen Reactions  . Adhesive [Tape] Other (See Comments)    Blisters skin, peels off. Please use paper tape.   . Codeine Hives, Itching and Swelling  . Latex  Hives    Consultations:  Pulmonology  Palliative care   Procedures/Studies: Ct Chest Wo Contrast  Result Date: 07/03/2019 CLINICAL DATA:  Follow-up abnormal chest radiograph recent pulmonary embolus. EXAM: CT CHEST WITHOUT CONTRAST TECHNIQUE: Multidetector CT imaging of the chest was performed following the standard protocol without IV contrast. COMPARISON:  CT chest 06/26/2019 FINDINGS: Cardiovascular: The heart size appears normal. Aortic atherosclerosis identified. Increase caliber of the main pulmonary artery measuring 3.5 cm suggestive of chronic pulmonary arterial hypertension. Three vessel coronary artery calcifications identified. Mediastinum/Nodes: Normal appearance of the thyroid gland. The trachea appears patent and is midline. Normal appearance of the esophagus. Right paratracheal lymph node measures 1.1 cm, image 64/2. Similar to previous exam. No axillary or supraclavicular adenopathy. Lungs/Pleura: Increase in volume of left pleural effusion. Interval development of extensive bilateral areas  of ground-glass attenuation throughout both lungs, most severely in the right upper lobe. Persistent wedge-shaped and peripheral areas of subpleural consolidation within the right lower lobe likely reflecting pulmonary infarct. Within the left upper lobe there is a new geographic area of peripheral ground-glass attenuation, airspace consolidation and internal cavitation likely reflecting pulmonary infarct, image 26/4. Within the anterolateral subpleural aspect of the left lower lobe there is similar area of ground-glass attenuation and progressive cavitation concerning for evolving changes of pulmonary infarct. Solid spiculated mass within the right lung apex is again noted and appears unchanged measuring 2.7 cm, image 30/4. This is suspicious for pulmonary neoplasm Upper Abdomen: No acute abnormality. Musculoskeletal: No acute abnormality identified. Similar appearance of increased sclerosis  associated with T3, T4 and T5. IMPRESSION: 1. Interval development of extensive bilateral areas of ground-glass attenuation, which may represent areas of pulmonary edema or alveolar hemorrhage secondary to infarct. New geographic area of ground-glass attenuation, mild airspace consolidation and internal cavitation within the left upper lobe concerning for infarct. Persistent and slightly progressive area of ground-glass attenuation and cavitation is noted in the left lower lobe also worrisome for sequelae of pulmonary infarct. 2. Stable appearance of spiculated mass within the right lung apex which is suspicious for primary bronchogenic carcinoma. 3. Increase in volume of left pleural effusion. 4. Three vessel coronary artery calcifications noted. 5. Increase caliber of the main pulmonary artery suggestive of chronic pulmonary arterial hypertension. 6. Areas increased sclerosis within the upper thoracic vertebra noted. Primary differential considerations include metastatic disease versus discogenic sclerosis. Aortic Atherosclerosis (ICD10-I70.0). Electronically Signed   By: Kerby Moors M.D.   On: 07/03/2019 11:28   Ct Angio Chest Pe W And/or Wo Contrast  Result Date: 06/26/2019 CLINICAL DATA:  Shortness of breath EXAM: CT ANGIOGRAPHY CHEST WITH CONTRAST TECHNIQUE: Multidetector CT imaging of the chest was performed using the standard protocol during bolus administration of intravenous contrast. Multiplanar CT image reconstructions and MIPs were obtained to evaluate the vascular anatomy. CONTRAST:  13mL OMNIPAQUE IOHEXOL 350 MG/ML SOLN COMPARISON:  Chest radiograph June 26, 2019 FINDINGS: Cardiovascular: There are pulmonary emboli arising from each distal main pulmonary artery, more pronounced on the left than on the right, with extension of pulmonary emboli throughout multiple upper and lower lobe pulmonary arterial branches bilaterally, more pronounced overall on the left than on the right. The right  ventricle to left ventricle diameter ratio is 2.1, indicative of right heart strain. There is no thoracic aortic aneurysm or dissection. Visualized great vessels appear unremarkable. There is no pericardial effusion or pericardial thickening. There are foci of aortic atherosclerosis. The main pulmonary outflow tract measures 3.5 cm in diameter, prominent. Mediastinum/Nodes: Thyroid appears unremarkable. There are scattered subcentimeter mediastinal lymph nodes. There is a right pretracheal lymph node measuring 1.5 x 1.2 cm. No esophageal lesions are evident. Lungs/Pleura: There is an area of irregular opacity in the posterior segment of the right upper lobe toward the apex measuring 3.1 x 2.9 cm. There is patchy airspace opacity in the periphery of each upper lobe. There is a nodular opacity in the posterior segment of the right upper lobe seen on axial slice 49 series 6 measuring 1.0 x 1.0 cm. A nodular opacity abuts the pleura in the posterior segment of the right upper lobe measuring 0.7 cm. There is ill-defined airspace opacity in the lingula and anterior segment of the right lower lobe. There is irregular opacity with early cavitation in the anterior and lateral segments of the left lower lobe measuring 4.8  x 2.9 cm. There is a nodular opacity slightly inferior to this area of apparent cavitation measuring 1.6 x 1.7 cm. There are areas of patchy atelectasis in the lung bases. A nodular opacity in the lateral segment left lower lobe measures 0.9 x 0.9 cm. There are suspected pulmonary infarcts in the right lower lobe peripherally. These areas are wedge-shaped. There are no evident pleural effusions. Upper Abdomen: There is atherosclerotic plaque in the upper abdominal aorta. There is reflux of contrast into the inferior vena cava and hepatic veins. Visualized upper abdominal structures otherwise appear unremarkable. Musculoskeletal: Sclerotic foci are noted in several upper thoracic vertebral bodies. No lytic or  destructive bone lesions are evident. No chest wall lesions evident. Review of the MIP images confirms the above findings. IMPRESSION: 1. Extensive bilateral pulmonary embolus, more severe on the left than on the right which arises from the distal main pulmonary arteries, more severe on the left than on the right, and involves multiple upper and lower lobe pulmonary artery branches. Positive for acute PE with CT evidence of right heart strain (RV/LV Ratio = 2.1) consistent with at least submassive (intermediate risk) PE. The presence of right heart strain has been associated with an increased risk of morbidity and mortality. Please activate Code PE by paging [phone]. 2. Multiple irregular opacities concerning for foci of neoplasm within the lung parenchyma. There is also evidence suggesting pulmonary infarcts in the periphery of the right lower lobe and areas of patchy apparent pneumonitis in the upper lobes. An area of cavitation is noted in the left lower lobe which may represent developing cavitary pneumonia or possibly cavitary neoplasm. 3. Enlarged pretracheal lymph node concerning for neoplastic etiology given other findings. 4. Several sclerotic foci in upper thoracic vertebral bodies, potentially representing metastatic lesions. No destructive bone lesions. 5. Enlargement of the main pulmonary outflow tract, a finding indicative of pulmonary arterial hypertension. 6. Reflux of contrast into the inferior vena cava and hepatic veins, likely representing a degree of increase in right heart pressure. Aortic Atherosclerosis (ICD10-I70.0). Critical Value/emergent results were called by telephone at the time of interpretation on 06/26/2019 at 1:37 pm to providerJOSHUA LONG , who verbally acknowledged these results. Electronically Signed   By: Lowella Grip III M.D.   On: 06/26/2019 13:37   Dg Chest Port 1 View  Result Date: 07/02/2019 CLINICAL DATA:  Acute onset of chest pain and shortness of breath.  COPD. Acute pulmonary embolism. EXAM: PORTABLE CHEST 1 VIEW COMPARISON:  06/26/2019 FINDINGS: Heart size remains normal. Peripheral infiltrate and pleural thickening in the lateral left lung base shows no significant change. Mild right upper lobe airspace disease has increased since previous study. New nodular opacity with central lucency possibly due to cavitation in the left lung apex. Persistent ill-defined pulmonary nodular opacity seen in the right lung apex without significant change. IMPRESSION: No significant change in peripheral infiltrate and pleural thickening in the lateral left lung base and ill-defined right atypical pulmonary nodule. New nodular opacity with central lucency in the left lung apex, suspicious for cavitation. Differential diagnosis includes pulmonary infarcts, pneumonia, and neoplasm. Electronically Signed   By: Marlaine Hind M.D.   On: 07/02/2019 19:30   Dg Chest Portable 1 View  Result Date: 06/26/2019 CLINICAL DATA:  Shortness of breath EXAM: PORTABLE CHEST 1 VIEW COMPARISON:  02/11/2016 FINDINGS: Heart is normal size. Nodular density projects over the right upper lobe and anterior right 2nd rib. This could be bony, but cannot exclude pulmonary nodule. Peripheral opacity in the  lingula. No effusions. No acute bony abnormality. IMPRESSION: Nodular density projecting over the right upper lobe and anterior 2nd rib. This could be bony in nature, but cannot exclude pulmonary nodule. Recommend further evaluation with chest CT. Peripheral opacity in the lingula concerning for developing infiltrate/pneumonia. Electronically Signed   By: Rolm Baptise M.D.   On: 06/26/2019 10:59     Discharge Exam: Vitals:   07/10/19 0817 07/10/19 1000  BP: 127/68 114/69  Pulse: (!) 110 95  Resp: (!) 34 (!) 32  Temp:    SpO2: 95% 98%   Vitals:   07/10/19 0400 07/10/19 0500 07/10/19 0817 07/10/19 1000  BP: 125/72  127/68 114/69  Pulse: 90  (!) 110 95  Resp: (!) 25  (!) 34 (!) 32  Temp: 98.1  F (36.7 C)     TempSrc: Oral     SpO2: 94%  95% 98%  Weight:  99 kg    Height:        General: Pt is alert, awake, not in acute distress Cardiovascular: RRR, S1/S2 +, no rubs, no gallops Respiratory: CTA bilaterally, no wheezing, no rhonchi, currently on 4 L nasal cannula oxygen Abdominal: Soft, NT, ND, bowel sounds + Extremities: no edema, no cyanosis    The results of significant diagnostics from this hospitalization (including imaging, microbiology, ancillary and laboratory) are listed below for reference.     Microbiology: No results found for this or any previous visit (from the past 240 hour(s)).   Labs: BNP (last 3 results) Recent Labs    06/26/19 1000  BNP 3,536.1*   Basic Metabolic Panel: Recent Labs  Lab 07/06/19 0433 07/07/19 0438 07/08/19 0454 07/09/19 0423 07/10/19 0425  NA 135 136 135 136 135  K 4.6 4.1 3.8 3.9 3.7  CL 101 101 100 101 102  CO2 24 24 25 24 22   GLUCOSE 252* 182* 129* 175* 130*  BUN 32* 34* 27* 26* 23*  CREATININE 0.66 0.55* 0.52* 0.59* 0.61  CALCIUM 9.1 8.9 8.7* 8.6* 8.3*   Liver Function Tests: No results for input(s): AST, ALT, ALKPHOS, BILITOT, PROT, ALBUMIN in the last 168 hours. No results for input(s): LIPASE, AMYLASE in the last 168 hours. No results for input(s): AMMONIA in the last 168 hours. CBC: Recent Labs  Lab 07/06/19 0433 07/07/19 0438 07/08/19 0454 07/09/19 0423 07/10/19 0425  WBC 35.4* 26.6* 27.2* 29.0* 27.4*  HGB 13.4 12.9* 12.3* 11.7* 10.7*  HCT 42.9 41.4 39.4 37.7* 34.8*  MCV 86.1 86.6 86.8 86.3 87.2  PLT 170 164 184 223 257   Cardiac Enzymes: No results for input(s): CKTOTAL, CKMB, CKMBINDEX, TROPONINI in the last 168 hours. BNP: Invalid input(s): POCBNP CBG: Recent Labs  Lab 07/09/19 0813 07/09/19 1115 07/09/19 1630 07/09/19 2110 07/10/19 0723  GLUCAP 187* 189* 266* 243* 119*   D-Dimer No results for input(s): DDIMER in the last 72 hours. Hgb A1c No results for input(s): HGBA1C in the  last 72 hours. Lipid Profile No results for input(s): CHOL, HDL, LDLCALC, TRIG, CHOLHDL, LDLDIRECT in the last 72 hours. Thyroid function studies No results for input(s): TSH, T4TOTAL, T3FREE, THYROIDAB in the last 72 hours.  Invalid input(s): FREET3 Anemia work up No results for input(s): VITAMINB12, FOLATE, FERRITIN, TIBC, IRON, RETICCTPCT in the last 72 hours. Urinalysis    Component Value Date/Time   COLORURINE YELLOW 06/24/2017 1130   APPEARANCEUR CLEAR 06/24/2017 1130   LABSPEC 1.016 06/24/2017 1130   PHURINE 5.0 06/24/2017 1130   GLUCOSEU 50 (A) 06/24/2017 1130  HGBUR MODERATE (A) 06/24/2017 1130   BILIRUBINUR NEGATIVE 06/24/2017 1130   KETONESUR NEGATIVE 06/24/2017 1130   PROTEINUR 30 (A) 06/24/2017 1130   UROBILINOGEN 0.2 07/09/2015 1600   NITRITE NEGATIVE 06/24/2017 1130   LEUKOCYTESUR NEGATIVE 06/24/2017 1130   Sepsis Labs Invalid input(s): PROCALCITONIN,  WBC,  LACTICIDVEN Microbiology No results found for this or any previous visit (from the past 240 hour(s)).   Time coordinating discharge: 40 minutes  SIGNED:   Rodena Goldmann, DO [facility] 07/10/2019, 10:34 AM  If 7PM-7AM, please contact night-coverage [email or web address]

## 2019-07-10 NOTE — Progress Notes (Signed)
He is being discharged home.  I will of course sign off.  He will need follow-up and that is being arranged

## 2019-07-10 NOTE — Progress Notes (Signed)
SATURATION QUALIFICATIONS: (This note is used to comply with regulatory documentation for home oxygen)  Patient Saturations on Room Air at Rest = 91%  Patient Saturations on Room Air while Ambulating = 82%  Patient Saturations on 4 Liters of oxygen while Ambulating = 94%  Please briefly explain why patient needs home oxygen: Patient unable to maintain oxygen saturation with any exertion. Patient requires 4L [place] to maintain adequate oxygen saturation when ambulating.  Celestia Khat, RN

## 2019-07-10 NOTE — Progress Notes (Signed)
Patient being discharged to home. Oxygen was arranged and delivered to home and hospital. Gilford Rile also arranged. IV removed without difficulty. Waiting for ride and shirt.

## 2019-07-10 NOTE — TOC Transition Note (Signed)
Transition of Care [facility]) - CM/SW Discharge Note   Patient Details  Name: NAHUEL WILBERT MRN: [id number] Date of Birth: Nov 20, 1960  Transition of Care [facility]) CM/SW Contact:  Shade Flood, LCSW Phone Number: 07/10/2019, 12:36 PM   Clinical Narrative:     Plan is for dc home today. DME arranged. Updated Tim at [place]. Pt aware and in agreement. There are no other TOC needs for dc.  Final next level of care: [place] Barriers to Discharge: Barriers Resolved   Patient Goals and CMS Choice Patient states their goals for this hospitalization and ongoing recovery are:: Patient wants to go home. He does not want a nursing facility.      Discharge Placement                       Discharge Plan and Services                DME Arranged: Walker rolling with seat, Oxygen, Nebulizer machine DME Agency: [employer] Date DME Agency Contacted: 07/10/19 Time DME Agency Contacted: 1000 Representative spoke with at DME Agency: [place] Con[place] HH Arranged: PT [place] Agency: [facility] ([facility] [employer]) Date [place]: 07/09/19 Time [place]: [place] Representative spoke with at [place]: [place] ([place]) Interventions     Readmission Risk Interventions No flowsheet data found.

## 2019-07-10 NOTE — TOC Transition Note (Signed)
Transition of Care [facility]) - CM/SW Discharge Note   Patient Details  Name: Craig Owens MRN: [id number] Date of Birth: 05-Aug-1961  Transition of Care [facility]) CM/SW Contact:  Shade Flood, LCSW Phone Number: 07/10/2019, 12:40 PM   Clinical Narrative:     DC to home with DME and [facility]  Final next level of care: Home w Home Health Services Barriers to Discharge: Barriers Resolved   Patient Goals and CMS Choice Patient states their goals for this hospitalization and ongoing recovery are:: Patient wants to go home. He does not want a nursing facility.      Discharge Placement                       Discharge Plan and Services                DME Arranged: Walker rolling with seat, Oxygen, Nebulizer machine DME Agency: [employer] Date DME Agency Contacted: 07/10/19 Time DME Agency Contacted: 1000 Representative spoke with at DME Agency: [place] Con[place] HH Arranged: PT [place] Agency: [facility] ([facility] [employer]) Date [place]: 07/09/19 Time [place]: [id number] Representative spoke with at [place]: [place] ([place]) Interventions     Readmission Risk Interventions Readmission Risk Prevention Plan 07/10/2019  Transportation Screening Complete  Medication Review Press photographer) Complete  PCP or Specialist appointment within 3-5 days of discharge Complete  HRI or [place] Complete  SW Recovery Care/Counseling Consult Complete  [place] Not Applicable  Some recent data might be hidden

## 2019-07-14 ENCOUNTER — Other Ambulatory Visit: Payer: Self-pay

## 2019-07-14 ENCOUNTER — Inpatient Hospital Stay
Admission: AD | Admit: 2019-07-14 | Payer: Medicare HMO | Source: Other Acute Inpatient Hospital | Admitting: Internal Medicine

## 2019-07-14 ENCOUNTER — Emergency Department (HOSPITAL_COMMUNITY): Payer: Medicare HMO

## 2019-07-14 ENCOUNTER — Encounter (HOSPITAL_COMMUNITY): Payer: Self-pay | Admitting: *Deleted

## 2019-07-14 ENCOUNTER — Inpatient Hospital Stay (HOSPITAL_COMMUNITY)
Admission: EM | Admit: 2019-07-14 | Discharge: 2019-08-14 | DRG: 175 | Disposition: E | Payer: Medicare HMO | Attending: Family Medicine | Admitting: Family Medicine

## 2019-07-14 DIAGNOSIS — J984 Other disorders of lung: Secondary | ICD-10-CM | POA: Diagnosis not present

## 2019-07-14 DIAGNOSIS — J189 Pneumonia, unspecified organism: Secondary | ICD-10-CM | POA: Diagnosis not present

## 2019-07-14 DIAGNOSIS — R0600 Dyspnea, unspecified: Secondary | ICD-10-CM

## 2019-07-14 DIAGNOSIS — J441 Chronic obstructive pulmonary disease with (acute) exacerbation: Secondary | ICD-10-CM | POA: Diagnosis present

## 2019-07-14 DIAGNOSIS — J9601 Acute respiratory failure with hypoxia: Secondary | ICD-10-CM | POA: Diagnosis present

## 2019-07-14 DIAGNOSIS — J188 Other pneumonia, unspecified organism: Secondary | ICD-10-CM | POA: Diagnosis present

## 2019-07-14 DIAGNOSIS — K117 Disturbances of salivary secretion: Secondary | ICD-10-CM

## 2019-07-14 DIAGNOSIS — Z7901 Long term (current) use of anticoagulants: Secondary | ICD-10-CM

## 2019-07-14 DIAGNOSIS — I469 Cardiac arrest, cause unspecified: Secondary | ICD-10-CM | POA: Diagnosis present

## 2019-07-14 DIAGNOSIS — Z20828 Contact with and (suspected) exposure to other viral communicable diseases: Secondary | ICD-10-CM | POA: Diagnosis present

## 2019-07-14 DIAGNOSIS — R042 Hemoptysis: Secondary | ICD-10-CM | POA: Diagnosis present

## 2019-07-14 DIAGNOSIS — E1165 Type 2 diabetes mellitus with hyperglycemia: Secondary | ICD-10-CM | POA: Diagnosis present

## 2019-07-14 DIAGNOSIS — Z79891 Long term (current) use of opiate analgesic: Secondary | ICD-10-CM

## 2019-07-14 DIAGNOSIS — R0902 Hypoxemia: Secondary | ICD-10-CM

## 2019-07-14 DIAGNOSIS — E875 Hyperkalemia: Secondary | ICD-10-CM | POA: Diagnosis not present

## 2019-07-14 DIAGNOSIS — Y95 Nosocomial condition: Secondary | ICD-10-CM | POA: Diagnosis present

## 2019-07-14 DIAGNOSIS — E440 Moderate protein-calorie malnutrition: Secondary | ICD-10-CM | POA: Diagnosis present

## 2019-07-14 DIAGNOSIS — E669 Obesity, unspecified: Secondary | ICD-10-CM | POA: Diagnosis present

## 2019-07-14 DIAGNOSIS — D638 Anemia in other chronic diseases classified elsewhere: Secondary | ICD-10-CM | POA: Diagnosis present

## 2019-07-14 DIAGNOSIS — I69354 Hemiplegia and hemiparesis following cerebral infarction affecting left non-dominant side: Secondary | ICD-10-CM

## 2019-07-14 DIAGNOSIS — Z8349 Family history of other endocrine, nutritional and metabolic diseases: Secondary | ICD-10-CM

## 2019-07-14 DIAGNOSIS — D72829 Elevated white blood cell count, unspecified: Secondary | ICD-10-CM | POA: Diagnosis not present

## 2019-07-14 DIAGNOSIS — Z8249 Family history of ischemic heart disease and other diseases of the circulatory system: Secondary | ICD-10-CM

## 2019-07-14 DIAGNOSIS — Z6825 Body mass index (BMI) 25.0-25.9, adult: Secondary | ICD-10-CM

## 2019-07-14 DIAGNOSIS — E119 Type 2 diabetes mellitus without complications: Secondary | ICD-10-CM | POA: Diagnosis not present

## 2019-07-14 DIAGNOSIS — I2699 Other pulmonary embolism without acute cor pulmonale: Secondary | ICD-10-CM | POA: Diagnosis present

## 2019-07-14 DIAGNOSIS — E871 Hypo-osmolality and hyponatremia: Secondary | ICD-10-CM | POA: Diagnosis present

## 2019-07-14 DIAGNOSIS — C787 Secondary malignant neoplasm of liver and intrahepatic bile duct: Secondary | ICD-10-CM | POA: Diagnosis present

## 2019-07-14 DIAGNOSIS — B479 Mycetoma, unspecified: Secondary | ICD-10-CM | POA: Diagnosis present

## 2019-07-14 DIAGNOSIS — C3411 Malignant neoplasm of upper lobe, right bronchus or lung: Secondary | ICD-10-CM | POA: Diagnosis present

## 2019-07-14 DIAGNOSIS — J9621 Acute and chronic respiratory failure with hypoxia: Secondary | ICD-10-CM | POA: Diagnosis not present

## 2019-07-14 DIAGNOSIS — I6330 Cerebral infarction due to thrombosis of unspecified cerebral artery: Secondary | ICD-10-CM | POA: Diagnosis not present

## 2019-07-14 DIAGNOSIS — I2694 Multiple subsegmental pulmonary emboli without acute cor pulmonale: Secondary | ICD-10-CM | POA: Diagnosis not present

## 2019-07-14 DIAGNOSIS — Z66 Do not resuscitate: Secondary | ICD-10-CM | POA: Diagnosis present

## 2019-07-14 DIAGNOSIS — R0602 Shortness of breath: Secondary | ICD-10-CM

## 2019-07-14 DIAGNOSIS — T380X5A Adverse effect of glucocorticoids and synthetic analogues, initial encounter: Secondary | ICD-10-CM | POA: Diagnosis not present

## 2019-07-14 DIAGNOSIS — Z87442 Personal history of urinary calculi: Secondary | ICD-10-CM

## 2019-07-14 DIAGNOSIS — C3490 Malignant neoplasm of unspecified part of unspecified bronchus or lung: Secondary | ICD-10-CM | POA: Diagnosis not present

## 2019-07-14 DIAGNOSIS — J440 Chronic obstructive pulmonary disease with acute lower respiratory infection: Secondary | ICD-10-CM | POA: Diagnosis present

## 2019-07-14 DIAGNOSIS — Z8551 Personal history of malignant neoplasm of bladder: Secondary | ICD-10-CM

## 2019-07-14 DIAGNOSIS — F1210 Cannabis abuse, uncomplicated: Secondary | ICD-10-CM | POA: Diagnosis present

## 2019-07-14 DIAGNOSIS — F17210 Nicotine dependence, cigarettes, uncomplicated: Secondary | ICD-10-CM | POA: Diagnosis present

## 2019-07-14 DIAGNOSIS — Z79899 Other long term (current) drug therapy: Secondary | ICD-10-CM

## 2019-07-14 DIAGNOSIS — I272 Pulmonary hypertension, unspecified: Secondary | ICD-10-CM | POA: Diagnosis present

## 2019-07-14 DIAGNOSIS — Z7982 Long term (current) use of aspirin: Secondary | ICD-10-CM

## 2019-07-14 DIAGNOSIS — Z818 Family history of other mental and behavioral disorders: Secondary | ICD-10-CM

## 2019-07-14 DIAGNOSIS — Z833 Family history of diabetes mellitus: Secondary | ICD-10-CM

## 2019-07-14 DIAGNOSIS — Z7189 Other specified counseling: Secondary | ICD-10-CM

## 2019-07-14 DIAGNOSIS — R918 Other nonspecific abnormal finding of lung field: Secondary | ICD-10-CM | POA: Diagnosis not present

## 2019-07-14 DIAGNOSIS — R0682 Tachypnea, not elsewhere classified: Secondary | ICD-10-CM

## 2019-07-14 DIAGNOSIS — J9691 Respiratory failure, unspecified with hypoxia: Secondary | ICD-10-CM | POA: Diagnosis present

## 2019-07-14 DIAGNOSIS — I2510 Atherosclerotic heart disease of native coronary artery without angina pectoris: Secondary | ICD-10-CM | POA: Diagnosis present

## 2019-07-14 DIAGNOSIS — Z85118 Personal history of other malignant neoplasm of bronchus and lung: Secondary | ICD-10-CM

## 2019-07-14 DIAGNOSIS — I2609 Other pulmonary embolism with acute cor pulmonale: Principal | ICD-10-CM | POA: Diagnosis present

## 2019-07-14 DIAGNOSIS — I361 Nonrheumatic tricuspid (valve) insufficiency: Secondary | ICD-10-CM | POA: Diagnosis not present

## 2019-07-14 DIAGNOSIS — Z515 Encounter for palliative care: Secondary | ICD-10-CM | POA: Diagnosis present

## 2019-07-14 DIAGNOSIS — Z85038 Personal history of other malignant neoplasm of large intestine: Secondary | ICD-10-CM

## 2019-07-14 DIAGNOSIS — J9690 Respiratory failure, unspecified, unspecified whether with hypoxia or hypercapnia: Secondary | ICD-10-CM

## 2019-07-14 DIAGNOSIS — C3412 Malignant neoplasm of upper lobe, left bronchus or lung: Secondary | ICD-10-CM | POA: Diagnosis not present

## 2019-07-14 DIAGNOSIS — I6340 Cerebral infarction due to embolism of unspecified cerebral artery: Secondary | ICD-10-CM | POA: Diagnosis not present

## 2019-07-14 DIAGNOSIS — I639 Cerebral infarction, unspecified: Secondary | ICD-10-CM | POA: Diagnosis not present

## 2019-07-14 LAB — CBC WITH DIFFERENTIAL/PLATELET
Abs Immature Granulocytes: 0.43 10*3/uL — ABNORMAL HIGH (ref 0.00–0.07)
Abs Immature Granulocytes: 0.3 10*3/uL — ABNORMAL HIGH (ref 0.00–0.07)
Basophils Absolute: 0.1 10*3/uL (ref 0.0–0.1)
Basophils Absolute: 0.1 10*3/uL (ref 0.0–0.1)
Basophils Relative: 0 %
Basophils Relative: 0 %
Eosinophils Absolute: 0 10*3/uL (ref 0.0–0.5)
Eosinophils Absolute: 0 10*3/uL (ref 0.0–0.5)
Eosinophils Relative: 0 %
Eosinophils Relative: 0 %
HCT: 34.5 % — ABNORMAL LOW (ref 39.0–52.0)
HCT: 32.2 % — ABNORMAL LOW (ref 39.0–52.0)
Hemoglobin: 10.7 g/dL — ABNORMAL LOW (ref 13.0–17.0)
Hemoglobin: 10.2 g/dL — ABNORMAL LOW (ref 13.0–17.0)
Immature Granulocytes: 1 %
Immature Granulocytes: 1 %
Lymphocytes Relative: 5 %
Lymphocytes Relative: 10 %
Lymphs Abs: 1.6 10*3/uL (ref 0.7–4.0)
Lymphs Abs: 3 10*3/uL (ref 0.7–4.0)
MCH: 26.8 pg (ref 26.0–34.0)
MCH: 26.5 pg (ref 26.0–34.0)
MCHC: 31 g/dL (ref 30.0–36.0)
MCHC: 31.7 g/dL (ref 30.0–36.0)
MCV: 86.3 fL (ref 80.0–100.0)
MCV: 83.6 fL (ref 80.0–100.0)
Monocytes Absolute: 0.6 10*3/uL (ref 0.1–1.0)
Monocytes Absolute: 0.9 10*3/uL (ref 0.1–1.0)
Monocytes Relative: 2 %
Monocytes Relative: 3 %
Neutro Abs: 27.8 10*3/uL — ABNORMAL HIGH (ref 1.7–7.7)
Neutro Abs: 26.3 10*3/uL — ABNORMAL HIGH (ref 1.7–7.7)
Neutrophils Relative %: 92 %
Neutrophils Relative %: 86 %
Platelets: 280 10*3/uL (ref 150–400)
Platelets: 288 10*3/uL (ref 150–400)
RBC: 4 MIL/uL — ABNORMAL LOW (ref 4.22–5.81)
RBC: 3.85 MIL/uL — ABNORMAL LOW (ref 4.22–5.81)
RDW: 14.9 % (ref 11.5–15.5)
RDW: 15 % (ref 11.5–15.5)
WBC: 30.5 10*3/uL — ABNORMAL HIGH (ref 4.0–10.5)
WBC: 29.8 10*3/uL — ABNORMAL HIGH (ref 4.0–10.5)
nRBC: 0 % (ref 0.0–0.2)
nRBC: 0 % (ref 0.0–0.2)

## 2019-07-14 LAB — COMPREHENSIVE METABOLIC PANEL
ALT: 28 U/L (ref 0–44)
ALT: 24 U/L (ref 0–44)
AST: 31 U/L (ref 15–41)
AST: 30 U/L (ref 15–41)
Albumin: 1.8 g/dL — ABNORMAL LOW (ref 3.5–5.0)
Albumin: 1.6 g/dL — ABNORMAL LOW (ref 3.5–5.0)
Alkaline Phosphatase: 134 U/L — ABNORMAL HIGH (ref 38–126)
Alkaline Phosphatase: 122 U/L (ref 38–126)
Anion gap: 14 (ref 5–15)
Anion gap: 11 (ref 5–15)
BUN: 23 mg/dL — ABNORMAL HIGH (ref 6–20)
BUN: 20 mg/dL (ref 6–20)
CO2: 19 mmol/L — ABNORMAL LOW (ref 22–32)
CO2: 20 mmol/L — ABNORMAL LOW (ref 22–32)
Calcium: 8.3 mg/dL — ABNORMAL LOW (ref 8.9–10.3)
Calcium: 8.1 mg/dL — ABNORMAL LOW (ref 8.9–10.3)
Chloride: 103 mmol/L (ref 98–111)
Chloride: 104 mmol/L (ref 98–111)
Creatinine, Ser: 0.77 mg/dL (ref 0.61–1.24)
Creatinine, Ser: 0.76 mg/dL (ref 0.61–1.24)
GFR calc Af Amer: >60 mL/min (ref >60)
GFR calc Af Amer: >60 mL/min (ref >60)
GFR calc non Af Amer: >60 mL/min (ref >60)
GFR calc non Af Amer: >60 mL/min (ref >60)
Glucose, Bld: 198 mg/dL — ABNORMAL HIGH (ref 70–99)
Glucose, Bld: 187 mg/dL — ABNORMAL HIGH (ref 70–99)
Potassium: 3.3 mmol/L — ABNORMAL LOW (ref 3.5–5.1)
Potassium: 3.3 mmol/L — ABNORMAL LOW (ref 3.5–5.1)
Sodium: 136 mmol/L (ref 135–145)
Sodium: 135 mmol/L (ref 135–145)
Total Bilirubin: 0.8 mg/dL (ref 0.3–1.2)
Total Bilirubin: 0.5 mg/dL (ref 0.3–1.2)
Total Protein: 7 g/dL (ref 6.5–8.1)
Total Protein: 6.1 g/dL — ABNORMAL LOW (ref 6.5–8.1)

## 2019-07-14 LAB — TROPONIN I (HIGH SENSITIVITY)
Troponin I (High Sensitivity): 29 ng/L — ABNORMAL HIGH (ref <18)
Troponin I (High Sensitivity): 31 ng/L — ABNORMAL HIGH (ref <18)

## 2019-07-14 LAB — PROTIME-INR
INR: 1.8 — ABNORMAL HIGH (ref 0.8–1.2)
INR: 1.7 — ABNORMAL HIGH (ref 0.8–1.2)
Prothrombin Time: 20.2 seconds — ABNORMAL HIGH (ref 11.4–15.2)
Prothrombin Time: 19.3 seconds — ABNORMAL HIGH (ref 11.4–15.2)

## 2019-07-14 LAB — I-STAT CHEM 8, ED
BUN: 20 mg/dL (ref 6–20)
Calcium, Ion: 1.18 mmol/L (ref 1.15–1.40)
Chloride: 103 mmol/L (ref 98–111)
Creatinine, Ser: 0.6 mg/dL — ABNORMAL LOW (ref 0.61–1.24)
Glucose, Bld: 192 mg/dL — ABNORMAL HIGH (ref 70–99)
HCT: 35 % — ABNORMAL LOW (ref 39.0–52.0)
Hemoglobin: 11.9 g/dL — ABNORMAL LOW (ref 13.0–17.0)
Potassium: 3.4 mmol/L — ABNORMAL LOW (ref 3.5–5.1)
Sodium: 138 mmol/L (ref 135–145)
TCO2: 21 mmol/L — ABNORMAL LOW (ref 22–32)

## 2019-07-14 LAB — URINALYSIS, ROUTINE W REFLEX MICROSCOPIC
Bacteria, UA: NONE SEEN
Bilirubin Urine: NEGATIVE
Glucose, UA: >=500 mg/dL — AB
Ketones, ur: 5 mg/dL — AB
Leukocytes,Ua: NEGATIVE
Nitrite: NEGATIVE
Protein, ur: 30 mg/dL — AB
Specific Gravity, Urine: 1.04 — ABNORMAL HIGH (ref 1.005–1.030)
pH: 6 (ref 5.0–8.0)

## 2019-07-14 LAB — PHOSPHORUS: Phosphorus: 3.3 mg/dL (ref 2.5–4.6)

## 2019-07-14 LAB — BRAIN NATRIURETIC PEPTIDE
B Natriuretic Peptide: 163 pg/mL — ABNORMAL HIGH (ref 0.0–100.0)
B Natriuretic Peptide: 115.8 pg/mL — ABNORMAL HIGH (ref 0.0–100.0)

## 2019-07-14 LAB — LACTIC ACID, PLASMA
Lactic Acid, Venous: 1.9 mmol/L (ref 0.5–1.9)
Lactic Acid, Venous: 1.9 mmol/L (ref 0.5–1.9)
Lactic Acid, Venous: 1.7 mmol/L (ref 0.5–1.9)

## 2019-07-14 LAB — MAGNESIUM: Magnesium: 2 mg/dL (ref 1.7–2.4)

## 2019-07-14 LAB — GLUCOSE, CAPILLARY: Glucose-Capillary: 159 mg/dL — ABNORMAL HIGH (ref 70–99)

## 2019-07-14 LAB — LIPASE, BLOOD: Lipase: 25 U/L (ref 11–51)

## 2019-07-14 LAB — APTT: aPTT: 35 seconds (ref 24–36)

## 2019-07-14 LAB — STREP PNEUMONIAE URINARY ANTIGEN: Strep Pneumo Urinary Antigen: NEGATIVE

## 2019-07-14 LAB — SARS CORONAVIRUS 2 BY RT PCR (HOSPITAL ORDER, PERFORMED IN ~~LOC~~ HOSPITAL LAB): SARS Coronavirus 2: NEGATIVE

## 2019-07-14 LAB — D-DIMER, QUANTITATIVE: D-Dimer, Quant: >20 ug/mL-FEU — ABNORMAL HIGH (ref 0.00–0.50)

## 2019-07-14 MED ORDER — PIPERACILLIN-TAZOBACTAM 3.375 G IVPB
3.3750 g | Freq: Three times a day (TID) | INTRAVENOUS | Status: DC
  Administered 2019-07-14 – 2019-07-16 (×5): 3.375 g via INTRAVENOUS
  Filled 2019-07-14 (×6): qty 50

## 2019-07-14 MED ORDER — ARFORMOTEROL TARTRATE 15 MCG/2ML IN NEBU
15.0000 ug | INHALATION_SOLUTION | Freq: Two times a day (BID) | RESPIRATORY_TRACT | Status: DC
  Administered 2019-07-14 – 2019-07-30 (×32): 15 ug via RESPIRATORY_TRACT
  Filled 2019-07-14 (×33): qty 2

## 2019-07-14 MED ORDER — DEXMEDETOMIDINE HCL IN NACL 400 MCG/100ML IV SOLN
0.4000 ug/kg/h | INTRAVENOUS | Status: DC
  Administered 2019-07-14: 0.4 ug/kg/h via INTRAVENOUS
  Filled 2019-07-14: qty 100

## 2019-07-14 MED ORDER — SODIUM CHLORIDE 0.9 % IV SOLN: 2.0000 g | Freq: Three times a day (TID) | INTRAVENOUS | Status: DC

## 2019-07-14 MED ORDER — METHYLPREDNISOLONE SODIUM SUCC 125 MG IJ SOLR
125.0000 mg | INTRAMUSCULAR | Status: DC
  Administered 2019-07-14: 125 mg via INTRAVENOUS
  Filled 2019-07-14: qty 2

## 2019-07-14 MED ORDER — BUDESONIDE 0.25 MG/2ML IN SUSP
0.2500 mg | Freq: Two times a day (BID) | RESPIRATORY_TRACT | Status: DC
  Administered 2019-07-14 – 2019-07-16 (×4): 0.25 mg via RESPIRATORY_TRACT
  Filled 2019-07-14 (×4): qty 2

## 2019-07-14 MED ORDER — VANCOMYCIN HCL IN DEXTROSE 1-5 GM/200ML-% IV SOLN
1000.0000 mg | INTRAVENOUS | Status: AC
  Administered 2019-07-14 (×2): 1000 mg via INTRAVENOUS
  Filled 2019-07-14 (×2): qty 200

## 2019-07-14 MED ORDER — SODIUM CHLORIDE 0.9 % IV SOLN
1.0000 g | Freq: Once | INTRAVENOUS | Status: AC
  Administered 2019-07-14: 16:00:00 1 g via INTRAVENOUS
  Filled 2019-07-14 (×2): qty 1

## 2019-07-14 MED ORDER — VORICONAZOLE 200 MG IV SOLR
6.0000 mg/kg | Freq: Two times a day (BID) | INTRAVENOUS | Status: DC
  Administered 2019-07-14: 630 mg via INTRAVENOUS
  Filled 2019-07-14 (×2): qty 630

## 2019-07-14 MED ORDER — VORICONAZOLE 200 MG IV SOLR: 4.0000 mg/kg | Freq: Two times a day (BID) | INTRAVENOUS | Status: DC

## 2019-07-14 MED ORDER — IPRATROPIUM-ALBUTEROL 0.5-2.5 (3) MG/3ML IN SOLN
3.0000 mL | RESPIRATORY_TRACT | Status: DC
  Administered 2019-07-14 – 2019-07-15 (×3): 3 mL via RESPIRATORY_TRACT
  Filled 2019-07-14 (×3): qty 3

## 2019-07-14 MED ORDER — VANCOMYCIN HCL 10 G IV SOLR
1250.0000 mg | Freq: Two times a day (BID) | INTRAVENOUS | Status: DC
  Administered 2019-07-15 – 2019-07-16 (×3): 1250 mg via INTRAVENOUS
  Filled 2019-07-14 (×4): qty 1250

## 2019-07-14 MED ORDER — HEPARIN (PORCINE) 25000 UT/250ML-% IV SOLN
16.0000 [IU]/kg/h | INTRAVENOUS | Status: DC
  Administered 2019-07-14: 16 [IU]/kg/h via INTRAVENOUS
  Filled 2019-07-14 (×2): qty 250

## 2019-07-14 MED ORDER — FUROSEMIDE 10 MG/ML IJ SOLN
40.0000 mg | Freq: Once | INTRAMUSCULAR | Status: AC
  Administered 2019-07-14: 40 mg via INTRAVENOUS
  Filled 2019-07-14: qty 4

## 2019-07-14 MED ORDER — IOHEXOL 350 MG/ML SOLN
100.0000 mL | Freq: Once | INTRAVENOUS | Status: AC | PRN
  Administered 2019-07-14: 15:00:00 100 mL via INTRAVENOUS

## 2019-07-14 MED ORDER — SODIUM CHLORIDE 0.9 % IV SOLN
100.0000 mg | Freq: Two times a day (BID) | INTRAVENOUS | Status: DC
  Administered 2019-07-15 – 2019-07-16 (×3): 100 mg via INTRAVENOUS
  Filled 2019-07-14 (×4): qty 100

## 2019-07-14 MED ORDER — INSULIN ASPART 100 UNIT/ML ~~LOC~~ SOLN
0.0000 [IU] | SUBCUTANEOUS | Status: DC
  Administered 2019-07-14: 3 [IU] via SUBCUTANEOUS
  Administered 2019-07-15: 15 [IU] via SUBCUTANEOUS
  Administered 2019-07-15: 8 [IU] via SUBCUTANEOUS
  Administered 2019-07-15: 5 [IU] via SUBCUTANEOUS
  Administered 2019-07-15: 8 [IU] via SUBCUTANEOUS
  Administered 2019-07-15: 11 [IU] via SUBCUTANEOUS
  Administered 2019-07-15: 5 [IU] via SUBCUTANEOUS
  Administered 2019-07-16: 20:00:00 8 [IU] via SUBCUTANEOUS
  Administered 2019-07-16 (×3): 11 [IU] via SUBCUTANEOUS
  Administered 2019-07-16: 8 [IU] via SUBCUTANEOUS
  Administered 2019-07-17 (×2): 11 [IU] via SUBCUTANEOUS
  Administered 2019-07-17: 5 [IU] via SUBCUTANEOUS

## 2019-07-14 MED ORDER — VANCOMYCIN HCL IN DEXTROSE 1-5 GM/200ML-% IV SOLN: 1000.0000 mg | Freq: Three times a day (TID) | INTRAVENOUS | Status: DC
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
## 2019-07-14 NOTE — Progress Notes (Signed)
Pharmacy Antibiotic Note  Craig Owens is a 58 y.o. male admitted on 07/03/2019 with pneumonia.  Pharmacy has been consulted for cefepime and vancomycin dosing.  Plan: Vancomycin 1000mg  IV every 8 hours.  Goal trough 15-20 mcg/mL. cefepime 2gm iv q8h  Height: 6\' 1"  (185.4 cm) Weight: 230 lb (104.3 kg) IBW/kg (Calculated) : 79.9  Temp (24hrs), Avg:97.8 F (36.6 C), Min:97.8 F (36.6 C), Max:97.8 F (36.6 C)  Recent Labs  Lab 07/08/19 0454 07/09/19 0423 07/10/19 0425 07/08/2019 1338 06/22/2019 1343 06/24/2019 1412  WBC 27.2* 29.0* 27.4* 30.5*  --   --   CREATININE 0.52* 0.59* 0.61 0.77  --  0.60*  LATICACIDVEN  --   --   --   --  1.9  --     Estimated Creatinine Clearance: 124.6 mL/min (A) (by C-G formula based on SCr of 0.6 mg/dL (L)).    Allergies  Allergen Reactions  . Adhesive [Tape] Other (See Comments)    Blisters skin, peels off. Please use paper tape.   . Codeine Hives, Itching and Swelling  . Latex Hives    Antimicrobials this admission: 10/31 cefepime >>  10/31 vancomycin >>   Microbiology results: 10/31 BCx: sent 10/31 MRSA PCR: sent  Thank you for allowing pharmacy to be a part of this patient's care.  Donna Christen Shadai Mcclane 06/17/2019 3:54 PM
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
## 2019-07-14 NOTE — ED Notes (Signed)
Pt sats down to 87-90% on nasal canula at 6 l.  Put back on NRB at 15 liters per ed PA vo.

## 2019-07-14 NOTE — ED Triage Notes (Signed)
Pt brought in by [facility] with c/o sudden onset of SOB that started 2 hours ago. Pt was discharged from hospital 2 days ago after a 15 day stay for multiple PEs. EMS reports lung sounds clear. Pt reports its easier to breathe when he lays on his right side. Pt reports he is supposed to wear 6L of O2 via [place] since he came home from the hospital. Denies fever, pain.
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
## 2019-07-14 NOTE — Progress Notes (Signed)
Pharmacy Antibiotic Note  Craig Owens is a 58 y.o. male admitted on 07/09/2019 with pneumonia.  Pharmacy has been consulted to change from cefepime to Zosyn to cover for aspiration.  Will change vancomycin dosing from trough to AUC as patient does not have meningitis.  SCr 0.6, CrCL 100 ml/min, afebrile, WBC 30.5, LA 1.9.  Plan: Vanc 1250mg  IV Q12H for AUC 498 using SCr 0.8 Zosyn EID 3.375gm IV Q8H Monitor renal fxn, clinical progress, vanc AUC   Height: 6\' 1"  (185.4 cm) Weight: 230 lb (104.3 kg) IBW/kg (Calculated) : 79.9  Temp (24hrs), Avg:97.8 F (36.6 C), Min:97.8 F (36.6 C), Max:97.8 F (36.6 C)  Recent Labs  Lab 07/08/19 0454 07/09/19 0423 07/10/19 0425 07/07/2019 1338 06/14/2019 1343 07/12/2019 1412 07/02/2019 1607  WBC 27.2* 29.0* 27.4* 30.5*  --   --   --   CREATININE 0.52* 0.59* 0.61 0.77  --  0.60*  --   LATICACIDVEN  --   --   --   --  1.9  --  1.9    Estimated Creatinine Clearance: 124.6 mL/min (A) (by C-G formula based on SCr of 0.6 mg/dL (L)).    Allergies  Allergen Reactions  . Adhesive [Tape] Other (See Comments)    Blisters skin, peels off. Please use paper tape.   . Codeine Hives, Itching and Swelling  . Latex Hives    10/31 cefepime >> 10/31 10/31 vancomycin >>  Zosyn 10/31 >>  10/31 BCx: sent 10/31 MRSA PCR: sent  Rooney Swails D. Mina Marble, PharmD, BCPS, [place] 07/06/2019, 9:13 PM

## 2019-07-14 NOTE — ED Notes (Signed)
Pt placed on 6 l via n/c, sats 89-91 % at this time

## 2019-07-15 ENCOUNTER — Inpatient Hospital Stay (HOSPITAL_COMMUNITY): Payer: Medicare HMO

## 2019-07-15 DIAGNOSIS — I361 Nonrheumatic tricuspid (valve) insufficiency: Secondary | ICD-10-CM

## 2019-07-15 DIAGNOSIS — I2694 Multiple subsegmental pulmonary emboli without acute cor pulmonale: Secondary | ICD-10-CM

## 2019-07-15 DIAGNOSIS — J9621 Acute and chronic respiratory failure with hypoxia: Secondary | ICD-10-CM

## 2019-07-15 LAB — LACTIC ACID, PLASMA
Lactic Acid, Venous: 1.4 mmol/L (ref 0.5–1.9)
Lactic Acid, Venous: 1.4 mmol/L (ref 0.5–1.9)

## 2019-07-15 LAB — POCT I-STAT 7, (LYTES, BLD GAS, ICA,H+H)
Acid-base deficit: 2 mmol/L (ref 0.0–2.0)
Bicarbonate: 20.8 mmol/L (ref 20.0–28.0)
Calcium, Ion: 1.17 mmol/L (ref 1.15–1.40)
HCT: 29 % — ABNORMAL LOW (ref 39.0–52.0)
Hemoglobin: 9.9 g/dL — ABNORMAL LOW (ref 13.0–17.0)
O2 Saturation: 99 %
Patient temperature: 98.9
Potassium: 3.3 mmol/L — ABNORMAL LOW (ref 3.5–5.1)
Sodium: 134 mmol/L — ABNORMAL LOW (ref 135–145)
TCO2: 22 mmol/L (ref 22–32)
pCO2 arterial: 27.1 mmHg — ABNORMAL LOW (ref 32.0–48.0)
pH, Arterial: 7.493 — ABNORMAL HIGH (ref 7.350–7.450)
pO2, Arterial: 118 mmHg — ABNORMAL HIGH (ref 83.0–108.0)

## 2019-07-15 LAB — CBC
HCT: 31.4 % — ABNORMAL LOW (ref 39.0–52.0)
HCT: 28.8 % — ABNORMAL LOW (ref 39.0–52.0)
Hemoglobin: 10.3 g/dL — ABNORMAL LOW (ref 13.0–17.0)
Hemoglobin: 9.3 g/dL — ABNORMAL LOW (ref 13.0–17.0)
MCH: 27.3 pg (ref 26.0–34.0)
MCH: 26.9 pg (ref 26.0–34.0)
MCHC: 32.8 g/dL (ref 30.0–36.0)
MCHC: 32.3 g/dL (ref 30.0–36.0)
MCV: 83.3 fL (ref 80.0–100.0)
MCV: 83.2 fL (ref 80.0–100.0)
Platelets: 270 10*3/uL (ref 150–400)
Platelets: 258 10*3/uL (ref 150–400)
RBC: 3.77 MIL/uL — ABNORMAL LOW (ref 4.22–5.81)
RBC: 3.46 MIL/uL — ABNORMAL LOW (ref 4.22–5.81)
RDW: 15.1 % (ref 11.5–15.5)
RDW: 15 % (ref 11.5–15.5)
WBC: 27 10*3/uL — ABNORMAL HIGH (ref 4.0–10.5)
WBC: 20 10*3/uL — ABNORMAL HIGH (ref 4.0–10.5)
nRBC: 0 % (ref 0.0–0.2)
nRBC: 0 % (ref 0.0–0.2)

## 2019-07-15 LAB — EXPECTORATED SPUTUM ASSESSMENT W GRAM STAIN, RFLX TO RESP C

## 2019-07-15 LAB — BASIC METABOLIC PANEL
Anion gap: 14 (ref 5–15)
BUN: 20 mg/dL (ref 6–20)
CO2: 19 mmol/L — ABNORMAL LOW (ref 22–32)
Calcium: 8.2 mg/dL — ABNORMAL LOW (ref 8.9–10.3)
Chloride: 104 mmol/L (ref 98–111)
Creatinine, Ser: 0.89 mg/dL (ref 0.61–1.24)
GFR calc Af Amer: >60 mL/min (ref >60)
GFR calc non Af Amer: >60 mL/min (ref >60)
Glucose, Bld: 192 mg/dL — ABNORMAL HIGH (ref 70–99)
Potassium: 3.2 mmol/L — ABNORMAL LOW (ref 3.5–5.1)
Sodium: 137 mmol/L (ref 135–145)

## 2019-07-15 LAB — GLUCOSE, CAPILLARY
Glucose-Capillary: 214 mg/dL — ABNORMAL HIGH (ref 70–99)
Glucose-Capillary: 277 mg/dL — ABNORMAL HIGH (ref 70–99)
Glucose-Capillary: 318 mg/dL — ABNORMAL HIGH (ref 70–99)
Glucose-Capillary: 249 mg/dL — ABNORMAL HIGH (ref 70–99)
Glucose-Capillary: 354 mg/dL — ABNORMAL HIGH (ref 70–99)
Glucose-Capillary: 283 mg/dL — ABNORMAL HIGH (ref 70–99)

## 2019-07-15 LAB — ECHOCARDIOGRAM LIMITED
Height: 73 in
Weight: 3195.79 oz

## 2019-07-15 LAB — APTT
aPTT: 52 seconds — ABNORMAL HIGH (ref 24–36)
aPTT: 63 seconds — ABNORMAL HIGH (ref 24–36)
aPTT: 61 seconds — ABNORMAL HIGH (ref 24–36)

## 2019-07-15 LAB — HEPARIN LEVEL (UNFRACTIONATED)
Heparin Unfractionated: 0.39 IU/mL (ref 0.30–0.70)
Heparin Unfractionated: 0.42 IU/mL (ref 0.30–0.70)
Heparin Unfractionated: 0.38 IU/mL (ref 0.30–0.70)

## 2019-07-15 LAB — PROCALCITONIN: Procalcitonin: 0.6 ng/mL

## 2019-07-15 LAB — MAGNESIUM: Magnesium: 1.9 mg/dL (ref 1.7–2.4)

## 2019-07-15 LAB — HIV ANTIBODY (ROUTINE TESTING W REFLEX): HIV Screen 4th Generation wRfx: NONREACTIVE

## 2019-07-15 LAB — PHOSPHORUS: Phosphorus: 3.2 mg/dL (ref 2.5–4.6)

## 2019-07-15 MED ORDER — HEPARIN (PORCINE) 25000 UT/250ML-% IV SOLN
2050.0000 [IU]/h | INTRAVENOUS | Status: DC
  Administered 2019-07-15: 1900 [IU]/h via INTRAVENOUS
  Administered 2019-07-15 (×2): 1800 [IU]/h via INTRAVENOUS
  Administered 2019-07-16 (×2): 2050 [IU]/h via INTRAVENOUS
  Filled 2019-07-15 (×3): qty 250

## 2019-07-15 MED ORDER — ALPRAZOLAM 0.25 MG PO TABS
0.2500 mg | ORAL_TABLET | Freq: Three times a day (TID) | ORAL | Status: DC | PRN
  Administered 2019-07-15 – 2019-07-18 (×6): 0.25 mg via ORAL
  Filled 2019-07-15 (×6): qty 1

## 2019-07-15 MED ORDER — SODIUM CHLORIDE 0.9 % IV BOLUS
500.0000 mL | Freq: Once | INTRAVENOUS | Status: AC
  Administered 2019-07-15: 500 mL via INTRAVENOUS

## 2019-07-15 MED ORDER — LACTATED RINGERS IV BOLUS
1000.0000 mL | Freq: Once | INTRAVENOUS | Status: AC
  Administered 2019-07-15: 1000 mL via INTRAVENOUS

## 2019-07-15 MED ORDER — PANTOPRAZOLE SODIUM 40 MG PO TBEC
40.0000 mg | DELAYED_RELEASE_TABLET | Freq: Every day | ORAL | Status: AC
  Administered 2019-07-15 – 2019-07-20 (×6): 40 mg via ORAL
  Filled 2019-07-15 (×6): qty 1

## 2019-07-15 MED ORDER — SODIUM CHLORIDE 0.9 % IV SOLN: INTRAVENOUS | Status: DC | PRN

## 2019-07-15 MED ORDER — IPRATROPIUM-ALBUTEROL 0.5-2.5 (3) MG/3ML IN SOLN
3.0000 mL | Freq: Four times a day (QID) | RESPIRATORY_TRACT | Status: DC
  Administered 2019-07-15 – 2019-07-17 (×7): 3 mL via RESPIRATORY_TRACT
  Filled 2019-07-15 (×7): qty 3

## 2019-07-15 MED ORDER — ORAL CARE MOUTH RINSE
15.0000 mL | Freq: Two times a day (BID) | OROMUCOSAL | Status: DC
  Administered 2019-07-16 – 2019-07-29 (×14): 15 mL via OROMUCOSAL

## 2019-07-15 MED ORDER — METHYLPREDNISOLONE SODIUM SUCC 125 MG IJ SOLR
60.0000 mg | Freq: Three times a day (TID) | INTRAMUSCULAR | Status: DC
  Administered 2019-07-15 – 2019-07-16 (×3): 60 mg via INTRAVENOUS
  Filled 2019-07-15 (×3): qty 2

## 2019-07-15 MED ORDER — HEPARIN BOLUS VIA INFUSION
1000.0000 [IU] | Freq: Once | INTRAVENOUS | Status: AC
  Administered 2019-07-15: 1000 [IU] via INTRAVENOUS
  Filled 2019-07-15: qty 1000

## 2019-07-15 MED ORDER — CHLORHEXIDINE GLUCONATE CLOTH 2 % EX PADS
6.0000 | MEDICATED_PAD | Freq: Every day | CUTANEOUS | Status: DC
  Administered 2019-07-15 – 2019-07-16 (×2): 6 via TOPICAL

## 2019-07-15 MED ORDER — POTASSIUM CHLORIDE 10 MEQ/100ML IV SOLN
10.0000 meq | INTRAVENOUS | Status: DC
  Administered 2019-07-15 (×2): 10 meq via INTRAVENOUS
  Filled 2019-07-15 (×4): qty 100

## 2019-07-15 MED ORDER — POTASSIUM CHLORIDE 10 MEQ/100ML IV SOLN
10.0000 meq | INTRAVENOUS | Status: AC
  Administered 2019-07-15 (×2): 10 meq via INTRAVENOUS

## 2019-07-15 MED ORDER — INSULIN GLARGINE 100 UNIT/ML ~~LOC~~ SOLN
10.0000 [IU] | Freq: Every day | SUBCUTANEOUS | Status: DC
  Administered 2019-07-15 – 2019-07-17 (×3): 10 [IU] via SUBCUTANEOUS
  Filled 2019-07-15 (×3): qty 0.1

## 2019-07-15 NOTE — Progress Notes (Signed)
[facility] Progress Note Patient Name: Craig Owens DOB: [birth year] MRN: [id number]   Date of Service  07/15/2019  HPI/Events of Note  Anxiety - Patient requests home Xanax.   eICU Interventions  Will order: 1. Xanax 0.25 mg PO TID PRN anxiety.      Intervention Category Major Interventions: Delirium, psychosis, severe agitation - evaluation and management  [name] 07/15/2019, 7:45 PM

## 2019-07-15 NOTE — Plan of Care (Signed)
Plan of care    Patient became more hypotensive during the shift. Required 500 cc's of fluids. Concerning for worsening PE with hemodynamic instability. Of note patient has several possible cuplrits as well. Diuresed 2 liters. Likely septic and noted to have black stools by nurse. Have spoken with IR and we both agree that patient not TPA candidate with cavitary lesions and possible melena. Not going to pursue ekos at this time. Could consider mecahnical thrmbectomy however will see how patient does over weekend as clot has been present for about 3 weeks. For now will continue to follow CBC and bolus prn. Start pressors if indicated.   Newell Coral DO Internal Medicine/Pediatrics Pulmonary and Critical Care Fellow PGY-6

## 2019-07-15 NOTE — Procedures (Signed)
Arterial Catheter Insertion Procedure Note Craig Owens [id number] 21-Jan-1961  Procedure: Insertion of Arterial Catheter  Indications: Blood pressure monitoring  Procedure Details Consent: Risks of procedure as well as the alternatives and risks of each were explained to the (patient/caregiver).  Consent for procedure obtained. Time Out: Verified patient identification, verified procedure, site/side was marked, verified correct patient position, special equipment/implants available, medications/allergies/relevent history reviewed, required imaging and test results available.  Performed  Maximum sterile technique was used including antiseptics, cap, gloves, gown, hand hygiene, mask and sheet. Skin prep: Chlorhexidine; local anesthetic administered 20 gauge catheter was inserted into right radial artery using the Seldinger technique. ULTRASOUND GUIDANCE USED: NO Evaluation Blood flow good; BP tracing good. Complications: No apparent complications.   Craig Owens 07/15/2019
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
## 2019-07-15 NOTE — Plan of Care (Signed)
  Problem: Clinical Measurements: Goal: Diagnostic test results will improve Outcome: Progressing Goal: Cardiovascular complication will be avoided Outcome: Progressing   

## 2019-07-15 NOTE — Progress Notes (Signed)
[facility] for heparin gtt  Indication: pulmonary embolus  Allergies  Allergen Reactions  . Adhesive [Tape] Other (See Comments)    Blisters skin, peels off. Please use paper tape.   . Codeine Hives, Itching and Swelling  . Latex Hives    Patient Measurements: Height: 6\' 1"  (185.4 cm) Weight: 203 lb 4.2 oz (92.2 kg) IBW/kg (Calculated) : 79.9 Heparin Dosing Weight: HEPARIN DW (KG): 101.2   Vital Signs: Temp: 98.9 F (37.2 C) (10/31 2344) Temp Source: Axillary (10/31 2344) BP: 83/60 (11/01 0244) Pulse Rate: 86 (11/01 0244)  Labs: Recent Labs    07/13/2019 1338 07/13/2019 1412 06/16/2019 2146 07/15/19 0009 07/15/19 0145  HGB 10.7* 11.9* 10.2* 9.9* 10.3*  HCT 34.5* 35.0* 32.2* 29.0* 31.4*  PLT 280  --  288  --  270  APTT 35  --   --   --  52*  LABPROT 20.2*  --  19.3*  --   --   INR 1.8*  --  1.7*  --   --   HEPARINUNFRC  --   --   --   --  0.39  CREATININE 0.77 0.60* 0.76  --   --     Estimated Creatinine Clearance: 111 mL/min (by C-G formula based on SCr of 0.76 mg/dL).  Assessment: Craig Owens a 58 y.o. male on heparin for PE.  Last dose of apixaban was 10/31AM APTT this am 52 sec, heparin level 0.39 units/ml  Goal of Therapy:  Heparin level 0.3-0.7 units/ml aPTT 66-102 seconds Monitor platelets by anticoagulation protocol: Yes   Plan:  Increase heparin drip to 1800 units/hr after bolus of 1000 units Check aPTT and heparin level in 6 hours  Craig Owens 07/15/2019,3:10 AM

## 2019-07-15 NOTE — Progress Notes (Signed)
[facility] for heparin gtt  Indication: pulmonary embolus  Allergies  Allergen Reactions  . Adhesive [Tape] Other (See Comments)    Blisters skin, peels off. Please use paper tape.   . Codeine Hives, Itching and Swelling  . Latex Hives    Patient Measurements: Height: 6\' 1"  (185.4 cm) Weight: 199 lb 11.8 oz (90.6 kg) IBW/kg (Calculated) : 79.9 Heparin Dosing Weight: HEPARIN DW (KG): 101.2   Vital Signs: Temp: 97.7 F (36.5 C) (11/01 0755) Temp Source: Oral (11/01 0755) BP: 89/62 (11/01 1100) Pulse Rate: 94 (11/01 1100)  Labs: Recent Labs    07/08/2019 1338 07/13/2019 1412 07/13/2019 2146 07/15/19 0009 07/15/19 0145 07/15/19 0640 07/15/19 1017  HGB 10.7* 11.9* 10.2* 9.9* 10.3* 9.3*  --   HCT 34.5* 35.0* 32.2* 29.0* 31.4* 28.8*  --   PLT 280  --  288  --  270 258  --   APTT 35  --   --   --  52*  --  63*  LABPROT 20.2*  --  19.3*  --   --   --   --   INR 1.8*  --  1.7*  --   --   --   --   HEPARINUNFRC  --   --   --   --  0.39  --  0.42  CREATININE 0.77 0.60* 0.76  --  0.89  --   --     Estimated Creatinine Clearance: 99.8 mL/min (by C-G formula based on SCr of 0.89 mg/dL).  Assessment: Craig Owens a 58 y.o. male on heparin for PE.  Last dose of apixaban was 10/31AM APTT this am 63 sec, heparin level 0.42 units/ml. Therapeutic heparin level and nearly therapeutic aPTT, has been ~24 hours since last noted dose of apixaban. Will increase rate slightly to 1900 and continue to monitor both aptt and HL   Goal of Therapy:  Heparin level 0.3-0.7 units/ml aPTT 66-102 seconds Monitor platelets by anticoagulation protocol: Yes   Plan:  Increase heparin drip to 1900 units/hr  Check aPTT and heparin level in 6 hours  Phillis Haggis 07/15/2019,11:27 AM

## 2019-07-15 NOTE — Progress Notes (Signed)
[facility] for heparin gtt  Indication: pulmonary embolus  Allergies  Allergen Reactions  . Adhesive [Tape] Other (See Comments)    Blisters skin, peels off. Please use paper tape.   . Codeine Hives, Itching and Swelling  . Latex Hives    Patient Measurements: Height: 6\' 1"  (185.4 cm) Weight: 199 lb 11.8 oz (90.6 kg) IBW/kg (Calculated) : 79.9 Heparin Dosing Weight: HEPARIN DW (KG): 101.2   Vital Signs: Temp: 97.9 F (36.6 C) (11/01 1139) Temp Source: Oral (11/01 1139) BP: 102/69 (11/01 2000) Pulse Rate: 97 (11/01 2000)  Labs: Recent Labs    06/14/2019 1338 07/11/2019 1412 06/18/2019 2146 07/15/19 0009 07/15/19 0145 07/15/19 0640 07/15/19 1017 07/15/19 1758  HGB 10.7* 11.9* 10.2* 9.9* 10.3* 9.3*  --   --   HCT 34.5* 35.0* 32.2* 29.0* 31.4* 28.8*  --   --   PLT 280  --  288  --  270 258  --   --   APTT 35  --   --   --  52*  --  63* 61*  LABPROT 20.2*  --  19.3*  --   --   --   --   --   INR 1.8*  --  1.7*  --   --   --   --   --   HEPARINUNFRC  --   --   --   --  0.39  --  0.42 0.38  CREATININE 0.77 0.60* 0.76  --  0.89  --   --   --     Estimated Creatinine Clearance: 99.8 mL/min (by C-G formula based on SCr of 0.89 mg/dL).  Assessment: Craig Owens a 58 y.o. male on heparin for PE.  Last dose of apixaban was 10/31AM.  APTT came back subtherapeutic at 61, on 1900 units/hr. Hgb 9.3, plt 258. No s/sx of bleeding. No infusion issues.   Goal of Therapy:  Heparin level 0.3-0.7 units/ml aPTT 66-102 seconds Monitor platelets by anticoagulation protocol: Yes   Plan:  Increase heparin drip to 2050 units/hr  Check aPTT and heparin level in 6 hours Monitor daily HL, aPTT, CBC, and for s/sx of bleeding  Antonietta Jewel, PharmD, BCCCP Clinical Pharmacist  Phone: [phone]  Please check AMION for all [place] phone numbers After 10:00 PM, call [facility] [phone] 07/15/2019,9:11 PM

## 2019-07-15 NOTE — Progress Notes (Signed)
  Echocardiogram 2D Echocardiogram has been performed.  Craig Owens 07/15/2019, 2:55 PM

## 2019-07-15 DEATH — deceased

## 2019-07-16 LAB — CBC
HCT: 27.3 % — ABNORMAL LOW (ref 39.0–52.0)
Hemoglobin: 8.9 g/dL — ABNORMAL LOW (ref 13.0–17.0)
MCH: 27.3 pg (ref 26.0–34.0)
MCHC: 32.6 g/dL (ref 30.0–36.0)
MCV: 83.7 fL (ref 80.0–100.0)
Platelets: 292 10*3/uL (ref 150–400)
RBC: 3.26 MIL/uL — ABNORMAL LOW (ref 4.22–5.81)
RDW: 15 % (ref 11.5–15.5)
WBC: 24.9 10*3/uL — ABNORMAL HIGH (ref 4.0–10.5)
nRBC: 0 % (ref 0.0–0.2)

## 2019-07-16 LAB — BASIC METABOLIC PANEL
Anion gap: 9 (ref 5–15)
BUN: 27 mg/dL — ABNORMAL HIGH (ref 6–20)
CO2: 20 mmol/L — ABNORMAL LOW (ref 22–32)
Calcium: 8 mg/dL — ABNORMAL LOW (ref 8.9–10.3)
Chloride: 104 mmol/L (ref 98–111)
Creatinine, Ser: 0.78 mg/dL (ref 0.61–1.24)
GFR calc Af Amer: >60 mL/min (ref >60)
GFR calc non Af Amer: >60 mL/min (ref >60)
Glucose, Bld: 347 mg/dL — ABNORMAL HIGH (ref 70–99)
Potassium: 3.3 mmol/L — ABNORMAL LOW (ref 3.5–5.1)
Sodium: 133 mmol/L — ABNORMAL LOW (ref 135–145)

## 2019-07-16 LAB — HEPARIN LEVEL (UNFRACTIONATED)
Heparin Unfractionated: 0.48 IU/mL (ref 0.30–0.70)
Heparin Unfractionated: 0.57 IU/mL (ref 0.30–0.70)

## 2019-07-16 LAB — URINE CULTURE: Culture: <10000 — AB

## 2019-07-16 LAB — GLUCOSE, CAPILLARY
Glucose-Capillary: 259 mg/dL — ABNORMAL HIGH (ref 70–99)
Glucose-Capillary: 344 mg/dL — ABNORMAL HIGH (ref 70–99)
Glucose-Capillary: 319 mg/dL — ABNORMAL HIGH (ref 70–99)
Glucose-Capillary: 322 mg/dL — ABNORMAL HIGH (ref 70–99)
Glucose-Capillary: 299 mg/dL — ABNORMAL HIGH (ref 70–99)

## 2019-07-16 LAB — LEGIONELLA PNEUMOPHILA SEROGP 1 UR AG: L. pneumophila Serogp 1 Ur Ag: NEGATIVE

## 2019-07-16 LAB — APTT
aPTT: 104 seconds — ABNORMAL HIGH (ref 24–36)
aPTT: 85 seconds — ABNORMAL HIGH (ref 24–36)

## 2019-07-16 LAB — MAGNESIUM: Magnesium: 2.1 mg/dL (ref 1.7–2.4)

## 2019-07-16 MED ORDER — SODIUM CHLORIDE 0.9 % IV SOLN: 3.0000 g | Freq: Four times a day (QID) | INTRAVENOUS | Status: DC

## 2019-07-16 MED ORDER — DOXYCYCLINE HYCLATE 100 MG PO TABS
100.0000 mg | ORAL_TABLET | Freq: Two times a day (BID) | ORAL | Status: DC
  Administered 2019-07-17: 100 mg via ORAL
  Filled 2019-07-16: qty 1

## 2019-07-16 MED ORDER — NICOTINE 21 MG/24HR TD PT24: 21.0000 mg | MEDICATED_PATCH | Freq: Every day | TRANSDERMAL | Status: DC

## 2019-07-16 MED ORDER — PREDNISONE 20 MG PO TABS
40.0000 mg | ORAL_TABLET | Freq: Every day | ORAL | Status: AC
  Administered 2019-07-17 – 2019-07-20 (×4): 40 mg via ORAL
  Filled 2019-07-16 (×4): qty 2

## 2019-07-16 MED ORDER — POTASSIUM CHLORIDE CRYS ER 20 MEQ PO TBCR
40.0000 meq | EXTENDED_RELEASE_TABLET | Freq: Two times a day (BID) | ORAL | Status: DC
  Administered 2019-07-16 – 2019-07-22 (×13): 40 meq via ORAL
  Filled 2019-07-16 (×14): qty 2

## 2019-07-16 MED ORDER — SODIUM CHLORIDE 0.9 % IV SOLN
100.0000 mg | Freq: Two times a day (BID) | INTRAVENOUS | Status: DC
  Administered 2019-07-16: 100 mg via INTRAVENOUS
  Filled 2019-07-16 (×2): qty 100

## 2019-07-16 MED ORDER — INFLUENZA VAC SPLIT QUAD 0.5 ML IM SUSY
0.5000 mL | PREFILLED_SYRINGE | INTRAMUSCULAR | Status: DC | PRN
  Filled 2019-07-16: qty 0.5

## 2019-07-16 MED ORDER — OXYCODONE-ACETAMINOPHEN 5-325 MG PO TABS
1.0000 | ORAL_TABLET | Freq: Four times a day (QID) | ORAL | Status: DC | PRN
  Administered 2019-07-16 – 2019-07-26 (×23): 1 via ORAL
  Filled 2019-07-16 (×24): qty 1

## 2019-07-16 MED ORDER — IBUPROFEN 200 MG PO TABS
400.0000 mg | ORAL_TABLET | Freq: Once | ORAL | Status: AC
  Administered 2019-07-16: 02:00:00 400 mg via ORAL
  Filled 2019-07-16: qty 2

## 2019-07-16 MED ORDER — BUDESONIDE 0.5 MG/2ML IN SUSP
0.5000 mg | Freq: Two times a day (BID) | RESPIRATORY_TRACT | Status: DC
  Administered 2019-07-16 – 2019-07-30 (×28): 0.5 mg via RESPIRATORY_TRACT
  Filled 2019-07-16 (×28): qty 2

## 2019-07-16 MED ORDER — PNEUMOCOCCAL VAC POLYVALENT 25 MCG/0.5ML IJ INJ
0.5000 mL | INJECTION | INTRAMUSCULAR | Status: DC | PRN
  Filled 2019-07-16: qty 0.5

## 2019-07-16 NOTE — Progress Notes (Addendum)
Pt says his back and bottom hurt from laying in the bed. He refuses to rate pain. He wants the "perc 5" that he takes at home. MD asked earlier in the night about ordering this drug. Per chart, pt has an allergy to codeine so it couldn't be ordered. This was explained to pt. Pt says that he isn't allergic to that drug, that it's another drug he is allergic to. Offer made to turn pt off of back/bottom to help with pain, pt refused. He says he is more comfortable on his side but that he isn't going to turn because he is upset. He says he will talk to the doctor in the morning and that he isn't going to talk to anyone else this morning. Hot pack offered and taken. Will communicate to day shift RN issue with allergies/pain medication and continue to monitor pt.    Update: 0650-Pt asleep

## 2019-07-16 NOTE — Progress Notes (Addendum)
[facility] for heparin Indication: pulmonary embolus  Allergies  Allergen Reactions  . Adhesive [Tape] Other (See Comments)    Blisters skin, peels off. Please use paper tape.   . Codeine Hives, Itching and Swelling  . Latex Hives    Patient Measurements: Height: 6\' 1"  (185.4 cm) Weight: 208 lb 12.4 oz (94.7 kg) IBW/kg (Calculated) : 79.9 Heparin Dosing Weight: HEPARIN DW (KG): 101.2   Vital Signs: Temp: 98.1 F (36.7 C) (11/02 0800) Temp Source: Oral (11/02 0800) BP: 103/55 (11/02 0800) Pulse Rate: 87 (11/02 0800)  Labs: Recent Labs    06/14/2019 1338 06/15/2019 1412 07/06/2019 2146  07/15/19 0145 07/15/19 0640 07/15/19 1017 07/15/19 1758 07/16/19 0511  HGB 10.7* 11.9* 10.2*   < > 10.3* 9.3*  --   --  8.9*  HCT 34.5* 35.0* 32.2*   < > 31.4* 28.8*  --   --  27.3*  PLT 280  --  288  --  270 258  --   --  292  APTT 35  --   --   --  52*  --  63* 61* 104*  LABPROT 20.2*  --  19.3*  --   --   --   --   --   --   INR 1.8*  --  1.7*  --   --   --   --   --   --   HEPARINUNFRC  --   --   --    < > 0.39  --  0.42 0.38 0.48  CREATININE 0.77 0.60* 0.76  --  0.89  --   --   --   --    < > = values in this interval not displayed.    Estimated Creatinine Clearance: 99.8 mL/min (by C-G formula based on SCr of 0.89 mg/dL).  Assessment: Craig Owens a 58 y.o. male on apixaban PTA for recently diagnosed PE, currently continuing on heparin for extensive acute PE with RHS. Last dose of apixaban was 10/31 AM.  Heparin level therapeutic after rate increase, high aPTT thought to be lab error. Will check confirmatory levels. Hg drifting down to 8.9, plt wnl. No active bleed issues documented.  Goal of Therapy:  Heparin level 0.3-0.7 units/ml aPTT 66-102 seconds Monitor platelets by anticoagulation protocol: Yes   Plan:  Continue heparin drip to 2050 units/hr  Check aPTT and heparin level in 6 hours to confirm Monitor daily heparin level, aPTT,  CBC, and for s/sx of bleeding F/u transition to long-term anticoagulation as appropriate  Elicia Lamp, PharmD, BCPS Please check AMION for all [place] contact numbers Clinical Pharmacist 07/16/2019 11:31 AM    ADDENDUM:  Confirmatory heparin level therapeutic at 0.57, aPTT appears to now be correlating at 85. Will d/c aPTT monitoring and use only heparin levels. No active bleed issues reported.  Plan: Continue heparin at 2050 units/hr Monitor daily heparin level and CBC, s/sx bleeding F/u transition to long-term anticoagulation as appropriate   Elicia Lamp, PharmD, BCPS Please check AMION for all [place] contact numbers Clinical Pharmacist 07/16/2019 2:13 PM

## 2019-07-16 NOTE — Progress Notes (Signed)
[facility] Progress Note Patient Name: Craig Owens DOB: Sep 11, 1961 MRN: [id number]   Date of Service  07/16/2019  HPI/Events of Note  Patient c/o of back and buttock pain. Allergy to Codeine. Creatinine = 0.89.   eICU Interventions  Will order: 1. Motrin 400 mg PO X 1.      Intervention Category Major Interventions: Other:  Rashena Dowling Cornelia Copa 07/16/2019, 2:05 AM
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
## 2019-07-17 ENCOUNTER — Inpatient Hospital Stay (HOSPITAL_COMMUNITY): Payer: Medicare HMO

## 2019-07-17 DIAGNOSIS — C3412 Malignant neoplasm of upper lobe, left bronchus or lung: Secondary | ICD-10-CM

## 2019-07-17 DIAGNOSIS — E875 Hyperkalemia: Secondary | ICD-10-CM

## 2019-07-17 DIAGNOSIS — E119 Type 2 diabetes mellitus without complications: Secondary | ICD-10-CM

## 2019-07-17 DIAGNOSIS — J441 Chronic obstructive pulmonary disease with (acute) exacerbation: Secondary | ICD-10-CM

## 2019-07-17 DIAGNOSIS — I2609 Other pulmonary embolism with acute cor pulmonale: Principal | ICD-10-CM

## 2019-07-17 LAB — GLUCOSE, CAPILLARY
Glucose-Capillary: 226 mg/dL — ABNORMAL HIGH (ref 70–99)
Glucose-Capillary: 243 mg/dL — ABNORMAL HIGH (ref 70–99)
Glucose-Capillary: 331 mg/dL — ABNORMAL HIGH (ref 70–99)
Glucose-Capillary: 332 mg/dL — ABNORMAL HIGH (ref 70–99)
Glucose-Capillary: 360 mg/dL — ABNORMAL HIGH (ref 70–99)
Glucose-Capillary: 394 mg/dL — ABNORMAL HIGH (ref 70–99)

## 2019-07-17 LAB — BLOOD GAS, ARTERIAL
Acid-Base Excess: 1 mmol/L (ref 0.0–2.0)
Bicarbonate: 24.8 mmol/L (ref 20.0–28.0)
Drawn by: 519031
FIO2: 50
O2 Saturation: 95.5 %
Patient temperature: 36.4
pCO2 arterial: 36.3 mmHg (ref 32.0–48.0)
pH, Arterial: 7.446 (ref 7.350–7.450)
pO2, Arterial: 74.5 mmHg — ABNORMAL LOW (ref 83.0–108.0)

## 2019-07-17 LAB — BASIC METABOLIC PANEL
Anion gap: 9 (ref 5–15)
Anion gap: 10 (ref 5–15)
BUN: 27 mg/dL — ABNORMAL HIGH (ref 6–20)
BUN: 25 mg/dL — ABNORMAL HIGH (ref 6–20)
CO2: 23 mmol/L (ref 22–32)
CO2: 21 mmol/L — ABNORMAL LOW (ref 22–32)
Calcium: 7.6 mg/dL — ABNORMAL LOW (ref 8.9–10.3)
Calcium: 8 mg/dL — ABNORMAL LOW (ref 8.9–10.3)
Chloride: 103 mmol/L (ref 98–111)
Chloride: 104 mmol/L (ref 98–111)
Creatinine, Ser: 0.7 mg/dL (ref 0.61–1.24)
Creatinine, Ser: 0.86 mg/dL (ref 0.61–1.24)
GFR calc Af Amer: >60 mL/min (ref >60)
GFR calc Af Amer: >60 mL/min (ref >60)
GFR calc non Af Amer: >60 mL/min (ref >60)
GFR calc non Af Amer: >60 mL/min (ref >60)
Glucose, Bld: 408 mg/dL — ABNORMAL HIGH (ref 70–99)
Glucose, Bld: 361 mg/dL — ABNORMAL HIGH (ref 70–99)
Potassium: 5.7 mmol/L — ABNORMAL HIGH (ref 3.5–5.1)
Potassium: 4.3 mmol/L (ref 3.5–5.1)
Sodium: 135 mmol/L (ref 135–145)
Sodium: 135 mmol/L (ref 135–145)

## 2019-07-17 LAB — CBC
HCT: 28 % — ABNORMAL LOW (ref 39.0–52.0)
Hemoglobin: 8.6 g/dL — ABNORMAL LOW (ref 13.0–17.0)
MCH: 26.5 pg (ref 26.0–34.0)
MCHC: 30.7 g/dL (ref 30.0–36.0)
MCV: 86.2 fL (ref 80.0–100.0)
Platelets: 318 10*3/uL (ref 150–400)
RBC: 3.25 MIL/uL — ABNORMAL LOW (ref 4.22–5.81)
RDW: 15.3 % (ref 11.5–15.5)
WBC: 26.2 10*3/uL — ABNORMAL HIGH (ref 4.0–10.5)
nRBC: 0.1 % (ref 0.0–0.2)

## 2019-07-17 LAB — HEPARIN LEVEL (UNFRACTIONATED)
Heparin Unfractionated: 1.36 IU/mL — ABNORMAL HIGH (ref 0.30–0.70)
Heparin Unfractionated: 1.52 IU/mL — ABNORMAL HIGH (ref 0.30–0.70)
Heparin Unfractionated: 1 IU/mL — ABNORMAL HIGH (ref 0.30–0.70)

## 2019-07-17 LAB — IMMUNOGLOBULINS A/E/G/M, SERUM
IgA: 355 mg/dL (ref 90–386)
IgE (Immunoglobulin E), Serum: 108 IU/mL (ref 6–495)
IgG (Immunoglobin G), Serum: 1287 mg/dL (ref 603–1613)
IgM (Immunoglobulin M), Srm: 57 mg/dL (ref 20–172)

## 2019-07-17 LAB — CULTURE, RESPIRATORY W GRAM STAIN

## 2019-07-17 LAB — FUNGITELL, SERUM: Fungitell Result: 34 pg/mL (ref <80)

## 2019-07-17 MED ORDER — INSULIN GLARGINE 100 UNIT/ML ~~LOC~~ SOLN
18.0000 [IU] | Freq: Every day | SUBCUTANEOUS | Status: DC
  Administered 2019-07-18: 18 [IU] via SUBCUTANEOUS
  Filled 2019-07-17: qty 0.18

## 2019-07-17 MED ORDER — HEPARIN (PORCINE) 25000 UT/250ML-% IV SOLN
1550.0000 [IU]/h | INTRAVENOUS | Status: DC
  Administered 2019-07-17: 1550 [IU]/h via INTRAVENOUS
  Filled 2019-07-17 (×2): qty 250

## 2019-07-17 MED ORDER — INSULIN ASPART 100 UNIT/ML ~~LOC~~ SOLN
0.0000 [IU] | Freq: Every day | SUBCUTANEOUS | Status: DC
  Administered 2019-07-17: 21:00:00 5 [IU] via SUBCUTANEOUS
  Administered 2019-07-19 – 2019-07-20 (×2): 3 [IU] via SUBCUTANEOUS
  Administered 2019-07-22 – 2019-07-25 (×3): 2 [IU] via SUBCUTANEOUS

## 2019-07-17 MED ORDER — SODIUM CHLORIDE 0.9 % IV SOLN
2.0000 g | Freq: Three times a day (TID) | INTRAVENOUS | Status: DC
  Administered 2019-07-17 – 2019-07-22 (×14): 2 g via INTRAVENOUS
  Filled 2019-07-17 (×16): qty 2

## 2019-07-17 MED ORDER — INSULIN ASPART 100 UNIT/ML ~~LOC~~ SOLN: 10.0000 [IU] | Freq: Once | SUBCUTANEOUS | Status: DC

## 2019-07-17 MED ORDER — VANCOMYCIN HCL IN DEXTROSE 750-5 MG/150ML-% IV SOLN
750.0000 mg | Freq: Three times a day (TID) | INTRAVENOUS | Status: DC
  Administered 2019-07-17 – 2019-07-22 (×13): 750 mg via INTRAVENOUS
  Filled 2019-07-17 (×15): qty 150

## 2019-07-17 MED ORDER — INSULIN ASPART 100 UNIT/ML ~~LOC~~ SOLN
0.0000 [IU] | Freq: Three times a day (TID) | SUBCUTANEOUS | Status: DC
  Administered 2019-07-17: 18:00:00 15 [IU] via SUBCUTANEOUS
  Administered 2019-07-17: 5 [IU] via SUBCUTANEOUS

## 2019-07-17 MED ORDER — IPRATROPIUM-ALBUTEROL 0.5-2.5 (3) MG/3ML IN SOLN
3.0000 mL | Freq: Two times a day (BID) | RESPIRATORY_TRACT | Status: DC
  Administered 2019-07-17 – 2019-07-30 (×26): 3 mL via RESPIRATORY_TRACT
  Filled 2019-07-17 (×26): qty 3

## 2019-07-17 MED ORDER — NICOTINE 14 MG/24HR TD PT24
14.0000 mg | MEDICATED_PATCH | Freq: Every day | TRANSDERMAL | Status: DC
  Filled 2019-07-17 (×8): qty 1

## 2019-07-17 MED ORDER — INSULIN ASPART 100 UNIT/ML ~~LOC~~ SOLN
0.0000 [IU] | Freq: Three times a day (TID) | SUBCUTANEOUS | Status: DC
  Administered 2019-07-18: 11 [IU] via SUBCUTANEOUS
  Administered 2019-07-18: 8 [IU] via SUBCUTANEOUS
  Administered 2019-07-18: 5 [IU] via SUBCUTANEOUS
  Administered 2019-07-19: 8 [IU] via SUBCUTANEOUS
  Administered 2019-07-19: 3 [IU] via SUBCUTANEOUS
  Administered 2019-07-19: 15 [IU] via SUBCUTANEOUS
  Administered 2019-07-20: 3 [IU] via SUBCUTANEOUS
  Administered 2019-07-20 (×2): 15 [IU] via SUBCUTANEOUS
  Administered 2019-07-21 (×2): 8 [IU] via SUBCUTANEOUS
  Administered 2019-07-21 – 2019-07-22 (×2): 5 [IU] via SUBCUTANEOUS
  Administered 2019-07-22 (×2): 3 [IU] via SUBCUTANEOUS
  Administered 2019-07-23: 5 [IU] via SUBCUTANEOUS
  Administered 2019-07-23: 3 [IU] via SUBCUTANEOUS
  Administered 2019-07-23: 5 [IU] via SUBCUTANEOUS
  Administered 2019-07-24: 2 [IU] via SUBCUTANEOUS
  Administered 2019-07-24: 8 [IU] via SUBCUTANEOUS
  Administered 2019-07-24: 11 [IU] via SUBCUTANEOUS
  Administered 2019-07-25 (×2): 8 [IU] via SUBCUTANEOUS
  Administered 2019-07-26 (×2): 3 [IU] via SUBCUTANEOUS
  Administered 2019-07-26: 5 [IU] via SUBCUTANEOUS
  Administered 2019-07-27 – 2019-07-28 (×4): 2 [IU] via SUBCUTANEOUS

## 2019-07-17 MED ORDER — GABAPENTIN 100 MG PO CAPS
100.0000 mg | ORAL_CAPSULE | Freq: Three times a day (TID) | ORAL | Status: DC
  Administered 2019-07-17 – 2019-07-26 (×28): 100 mg via ORAL
  Filled 2019-07-17 (×32): qty 1

## 2019-07-17 MED ORDER — FUROSEMIDE 10 MG/ML IJ SOLN
40.0000 mg | Freq: Once | INTRAMUSCULAR | Status: AC
  Administered 2019-07-17: 40 mg via INTRAVENOUS
  Filled 2019-07-17: qty 4
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
## 2019-07-17 NOTE — Progress Notes (Signed)
RT NOTES:  Called to room by RN and MD. Patient desaturated to 86%. Increased fio2 to 50% and flow to 25L. Will continue to monitor.

## 2019-07-17 NOTE — Progress Notes (Signed)
Inpatient Diabetes Program Recommendations  AACE/ADA: New Consensus Statement on Inpatient Glycemic Control (2015)  Target Ranges:  Prepandial:   less than 140 mg/dL      Peak postprandial:   less than 180 mg/dL (1-2 hours)      Critically ill patients:  140 - 180 mg/dL   Lab Results  Component Value Date   GLUCAP 331 (H) 07/17/2019   HGBA1C 8.1 (H) 06/26/2019    Review of Glycemic Control Results for TERRIUS, GENTILE (MRN [id number]) as of 07/17/2019 08:47  Ref. Range 07/16/2019 20:13 07/17/2019 00:05 07/17/2019 03:31 07/17/2019 08:00  Glucose-Capillary Latest Ref Range: 70 - 99 mg/dL 299 (H) 332 (H) 243 (H) 331 (H)   Diabetes history: Type 2 DM Outpatient Diabetes medications: Trulicity 6.21 mg Qwk, Invokana 300 mg QD Current orders for Inpatient glycemic control: Lantus 10 units QD, Novolog 0-15 units Q4H,  Prednisone 40 mg QAM (to start today)  Inpatient Diabetes Program Recommendations:    Noted steroid taper and patient has diet order.   Consider: -Increasing Lantus to 18 units QD -Changing correction to Novolog 0-15 units TID  Thanks, Bronson Curb, MSN, RNC-OB Diabetes Coordinator [phone] (8a-5p)

## 2019-07-17 NOTE — Progress Notes (Signed)
ANTICOAGULATION CONSULT NOTE - Follow Up Consult  Pharmacy Consult for Heparin Indication: pulmonary embolus  Allergies  Allergen Reactions  . Adhesive [Tape] Other (See Comments)    Blisters skin, peels off. Please use paper tape.   . Codeine Hives, Itching and Swelling  . Latex Hives    Patient Measurements: Height: 6\' 1"  (185.4 cm) Weight: 222 lb 3.6 oz (100.8 kg) IBW/kg (Calculated) : 79.9 Heparin Dosing Weight: 100kg  Vital Signs: Temp: 98 F (36.7 C) (11/03 1621) Temp Source: Oral (11/03 1621) BP: 113/56 (11/03 1621) Pulse Rate: 84 (11/03 1621)  Labs: Recent Labs    06/20/2019 2146  07/15/19 0145 07/15/19 0640  07/15/19 1758 07/16/19 0511 07/16/19 1116 07/16/19 1208 07/17/19 0337 07/17/19 0645 07/17/19 1524  HGB 10.2*   < > 10.3* 9.3*  --   --  8.9*  --   --  8.6*  --   --   HCT 32.2*   < > 31.4* 28.8*  --   --  27.3*  --   --  28.0*  --   --   PLT 288  --  270 258  --   --  292  --   --  318  --   --   APTT  --   --  52*  --    < > 61* 104*  --  85*  --   --   --   LABPROT 19.3*  --   --   --   --   --   --   --   --   --   --   --   INR 1.7*  --   --   --   --   --   --   --   --   --   --   --   HEPARINUNFRC  --   --  0.39  --    < > 0.38 0.48  --  0.57 1.36* 1.52* 1.00*  CREATININE 0.76  --  0.89  --   --   --   --  0.78  --   --   --  0.70   < > = values in this interval not displayed.    Estimated Creatinine Clearance: 122.6 mL/min (by C-G formula based on SCr of 0.7 mg/dL).   Assessment:  Anticoag: Heparin for acute extensive submassive PE w/ RHS. Last known apixaban dose 10/31 AM -HL= 1.36>>repeat 1.52>>1.00 (PM level)   Goal of Therapy:  Heparin level 0.3-0.7 units/ml Monitor platelets by anticoagulation protocol: Yes   Plan:  - Decrease infusion to 1550 units/hr and recheck in 8 hrs. - Recheck level in 8 hrs. Not clearing drug very quickly. -Monitor daily heparin level and CBC, s/sx bleeding   Mary Secord S. Alford Highland, PharmD,  [place] Clinical Staff Pharmacist Eilene Ghazi Stillinger 07/17/2019,5:03 PM

## 2019-07-17 NOTE — Care Management Important Message (Signed)
Important Message  Patient Details  Name: Craig Owens MRN: [id number] Date of Birth: 1961-07-23   Medicare Important Message Given:  Yes     [name] 07/17/2019, 3:16 PM
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
## 2019-07-17 NOTE — Progress Notes (Signed)
Pharmacy Antibiotic Note  Craig Owens is a 58 y.o. male admitted on 06/26/2019 - was on doxy for copd exacerbation, but now with acute drop in o2 sats requiring 25 L O2  SCr 0.7  Plan: vanc 1 g q8h - AUC 541 Cefepime 2 g q8h Monitor renal fx cx vanc lvls prn  Height: 6\' 1"  (185.4 cm) Weight: 222 lb 3.6 oz (100.8 kg) IBW/kg (Calculated) : 79.9  Temp (24hrs), Avg:98 F (36.7 C), Min:97.4 F (36.3 C), Max:98.6 F (37 C)  Recent Labs  Lab 07/05/2019 1343 06/29/2019 1412 07/02/2019 1607 07/12/2019 2146 07/15/19 0145 07/15/19 0640 07/16/19 0511 07/16/19 1116 07/17/19 0337 07/17/19 1524  WBC  --   --   --  29.8* 27.0* 20.0* 24.9*  --  26.2*  --   CREATININE  --  0.60*  --  0.76 0.89  --   --  0.78  --  0.70  LATICACIDVEN 1.9  --  1.9 1.7 1.4 1.4  --   --   --   --     Estimated Creatinine Clearance: 122.6 mL/min (by C-G formula based on SCr of 0.7 mg/dL).    Allergies  Allergen Reactions  . Adhesive [Tape] Other (See Comments)    Blisters skin, peels off. Please use paper tape.   . Codeine Hives, Itching and Swelling  . Latex Hives   Barth Kirks, PharmD, BCPS, BCCCP Clinical Pharmacist [phone]  Please check AMION for all [place] numbers  07/17/2019 4:28 PM
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
## 2019-07-17 NOTE — Progress Notes (Signed)
Jette for heparin Indication: pulmonary embolus  Allergies  Allergen Reactions  . Adhesive [Tape] Other (See Comments)    Blisters skin, peels off. Please use paper tape.   . Codeine Hives, Itching and Swelling  . Latex Hives    Patient Measurements: Height: 6\' 1"  (185.4 cm) Weight: 222 lb 3.6 oz (100.8 kg) IBW/kg (Calculated) : 79.9 Heparin Dosing Weight: HEPARIN DW (KG): 101.2   Vital Signs: Temp: 98.6 F (37 C) (11/03 0758) Temp Source: Oral (11/03 0758) BP: 104/53 (11/03 0900) Pulse Rate: 86 (11/03 0825)  Labs: Recent Labs    06/29/2019 1338  06/26/2019 2146  07/15/19 0145 07/15/19 0640  07/15/19 1758 07/16/19 0511 07/16/19 1116 07/16/19 1208 07/17/19 0337 07/17/19 0645  HGB 10.7*   < > 10.2*   < > 10.3* 9.3*  --   --  8.9*  --   --  8.6*  --   HCT 34.5*   < > 32.2*   < > 31.4* 28.8*  --   --  27.3*  --   --  28.0*  --   PLT 280  --  288  --  270 258  --   --  292  --   --  318  --   APTT 35  --   --   --  52*  --    < > 61* 104*  --  85*  --   --   LABPROT 20.2*  --  19.3*  --   --   --   --   --   --   --   --   --   --   INR 1.8*  --  1.7*  --   --   --   --   --   --   --   --   --   --   HEPARINUNFRC  --   --   --   --  0.39  --    < > 0.38 0.48  --  0.57 1.36* 1.52*  CREATININE 0.77   < > 0.76  --  0.89  --   --   --   --  0.78  --   --   --    < > = values in this interval not displayed.    Estimated Creatinine Clearance: 122.6 mL/min (by C-G formula based on SCr of 0.78 mg/dL).  Assessment: Craig Owens a 58 y.o. male on apixaban PTA for recently diagnosed PE, currently continuing on heparin for extensive acute PE with RHS. Last dose of apixaban was 10/31 AM. -heparin level this am was 1.36 and repeat was 1.52   Goal of Therapy:  Heparin level 0.3-0.7 units/ml aPTT 66-102 seconds Monitor platelets by anticoagulation protocol: Yes   Plan:  Hold heparin for 1 hour then decrease to 1800 units/hr  Heparin  level in 6 hours  Monitor daily heparin level, aPTT, CBC  Hildred Laser, PharmD Clinical Pharmacist **Pharmacist phone directory can now be found on [email or web address] ([email or web address]).  Listed under [place].

## 2019-07-18 ENCOUNTER — Inpatient Hospital Stay (HOSPITAL_COMMUNITY): Payer: Medicare HMO

## 2019-07-18 DIAGNOSIS — J984 Other disorders of lung: Secondary | ICD-10-CM

## 2019-07-18 LAB — GLUCOSE, CAPILLARY
Glucose-Capillary: 234 mg/dL — ABNORMAL HIGH (ref 70–99)
Glucose-Capillary: 268 mg/dL — ABNORMAL HIGH (ref 70–99)
Glucose-Capillary: 317 mg/dL — ABNORMAL HIGH (ref 70–99)
Glucose-Capillary: 338 mg/dL — ABNORMAL HIGH (ref 70–99)
Glucose-Capillary: 438 mg/dL — ABNORMAL HIGH (ref 70–99)

## 2019-07-18 LAB — BASIC METABOLIC PANEL
Anion gap: 9 (ref 5–15)
BUN: 20 mg/dL (ref 6–20)
CO2: 25 mmol/L (ref 22–32)
Calcium: 8.4 mg/dL — ABNORMAL LOW (ref 8.9–10.3)
Chloride: 100 mmol/L (ref 98–111)
Creatinine, Ser: 0.71 mg/dL (ref 0.61–1.24)
GFR calc Af Amer: >60 mL/min (ref >60)
GFR calc non Af Amer: >60 mL/min (ref >60)
Glucose, Bld: 307 mg/dL — ABNORMAL HIGH (ref 70–99)
Potassium: 4.2 mmol/L (ref 3.5–5.1)
Sodium: 134 mmol/L — ABNORMAL LOW (ref 135–145)

## 2019-07-18 LAB — COMPREHENSIVE METABOLIC PANEL
ALT: 20 U/L (ref 0–44)
AST: 17 U/L (ref 15–41)
Albumin: 1.5 g/dL — ABNORMAL LOW (ref 3.5–5.0)
Alkaline Phosphatase: 93 U/L (ref 38–126)
Anion gap: 8 (ref 5–15)
BUN: 21 mg/dL — ABNORMAL HIGH (ref 6–20)
CO2: 22 mmol/L (ref 22–32)
Calcium: 8 mg/dL — ABNORMAL LOW (ref 8.9–10.3)
Chloride: 106 mmol/L (ref 98–111)
Creatinine, Ser: 0.62 mg/dL (ref 0.61–1.24)
GFR calc Af Amer: >60 mL/min (ref >60)
GFR calc non Af Amer: >60 mL/min (ref >60)
Glucose, Bld: 303 mg/dL — ABNORMAL HIGH (ref 70–99)
Potassium: 4 mmol/L (ref 3.5–5.1)
Sodium: 136 mmol/L (ref 135–145)
Total Bilirubin: 0.2 mg/dL — ABNORMAL LOW (ref 0.3–1.2)
Total Protein: 5.3 g/dL — ABNORMAL LOW (ref 6.5–8.1)

## 2019-07-18 LAB — CBC
HCT: 28.5 % — ABNORMAL LOW (ref 39.0–52.0)
Hemoglobin: 8.6 g/dL — ABNORMAL LOW (ref 13.0–17.0)
MCH: 26.8 pg (ref 26.0–34.0)
MCHC: 30.2 g/dL (ref 30.0–36.0)
MCV: 88.8 fL (ref 80.0–100.0)
Platelets: 297 10*3/uL (ref 150–400)
RBC: 3.21 MIL/uL — ABNORMAL LOW (ref 4.22–5.81)
RDW: 15.7 % — ABNORMAL HIGH (ref 11.5–15.5)
WBC: 18.5 10*3/uL — ABNORMAL HIGH (ref 4.0–10.5)
nRBC: 0.5 % — ABNORMAL HIGH (ref 0.0–0.2)

## 2019-07-18 LAB — HEPARIN LEVEL (UNFRACTIONATED)
Heparin Unfractionated: 1.09 IU/mL — ABNORMAL HIGH (ref 0.30–0.70)
Heparin Unfractionated: 1.06 IU/mL — ABNORMAL HIGH (ref 0.30–0.70)
Heparin Unfractionated: 0.6 IU/mL (ref 0.30–0.70)
Heparin Unfractionated: 0.3 IU/mL (ref 0.30–0.70)

## 2019-07-18 LAB — MAGNESIUM: Magnesium: 1.9 mg/dL (ref 1.7–2.4)

## 2019-07-18 LAB — HISTOPLASMA ANTIGEN, URINE: Histoplasma Antigen, urine: <0.5 (ref <0.5)

## 2019-07-18 LAB — PHOSPHORUS: Phosphorus: 2 mg/dL — ABNORMAL LOW (ref 2.5–4.6)

## 2019-07-18 MED ORDER — INSULIN ASPART 100 UNIT/ML ~~LOC~~ SOLN: 20.0000 [IU] | Freq: Once | SUBCUTANEOUS | Status: DC

## 2019-07-18 MED ORDER — HEPARIN (PORCINE) 25000 UT/250ML-% IV SOLN
1900.0000 [IU]/h | INTRAVENOUS | Status: AC
  Administered 2019-07-18: 1300 [IU]/h via INTRAVENOUS
  Administered 2019-07-20: 1500 [IU]/h via INTRAVENOUS
  Administered 2019-07-21: 1800 [IU]/h via INTRAVENOUS
  Administered 2019-07-21: 14:00:00 1900 [IU]/h via INTRAVENOUS
  Filled 2019-07-18 (×5): qty 250

## 2019-07-18 MED ORDER — FUROSEMIDE 10 MG/ML IJ SOLN
40.0000 mg | Freq: Three times a day (TID) | INTRAMUSCULAR | Status: AC
  Administered 2019-07-18 (×2): 40 mg via INTRAVENOUS
  Filled 2019-07-18 (×2): qty 4

## 2019-07-18 MED ORDER — INSULIN ASPART 100 UNIT/ML ~~LOC~~ SOLN
10.0000 [IU] | Freq: Once | SUBCUTANEOUS | Status: AC
  Administered 2019-07-18: 10 [IU] via SUBCUTANEOUS

## 2019-07-18 MED ORDER — INSULIN GLARGINE 100 UNIT/ML ~~LOC~~ SOLN
20.0000 [IU] | Freq: Every day | SUBCUTANEOUS | Status: DC
  Administered 2019-07-19 – 2019-07-20 (×2): 20 [IU] via SUBCUTANEOUS
  Filled 2019-07-18 (×2): qty 0.2

## 2019-07-18 MED ORDER — MAGNESIUM SULFATE IN D5W 1-5 GM/100ML-% IV SOLN
1.0000 g | Freq: Once | INTRAVENOUS | Status: AC
  Administered 2019-07-19: 1 g via INTRAVENOUS
  Filled 2019-07-18: qty 100

## 2019-07-18 MED ORDER — INSULIN ASPART 100 UNIT/ML ~~LOC~~ SOLN
4.0000 [IU] | Freq: Three times a day (TID) | SUBCUTANEOUS | Status: DC
  Administered 2019-07-18 – 2019-07-21 (×8): 4 [IU] via SUBCUTANEOUS

## 2019-07-18 MED ORDER — K PHOS MONO-SOD PHOS DI & MONO 155-852-130 MG PO TABS
500.0000 mg | ORAL_TABLET | Freq: Two times a day (BID) | ORAL | Status: AC
  Administered 2019-07-18 – 2019-07-19 (×2): 500 mg via ORAL
  Filled 2019-07-18 (×2): qty 2

## 2019-07-18 NOTE — Progress Notes (Signed)
[name] for heparin Indication: pulmonary embolus  Allergies  Allergen Reactions  . Adhesive [Tape] Other (See Comments)    Blisters skin, peels off. Please use paper tape.   . Codeine Hives, Itching and Swelling  . Latex Hives    Patient Measurements: Height: 6\' 1"  (185.4 cm) Weight: 206 lb 9.1 oz (93.7 kg) IBW/kg (Calculated) : 79.9 Heparin Dosing Weight: HEPARIN DW (KG): 101.2   Vital Signs: Temp: 98 F (36.7 C) (11/04 1110) Temp Source: Oral (11/04 1110) BP: 119/70 (11/04 1110) Pulse Rate: 96 (11/04 1110)  Labs: Recent Labs    07/15/19 1758  07/16/19 0511  07/16/19 1208 07/17/19 0337  07/17/19 1524 07/17/19 1923 07/18/19 0150 07/18/19 0416 07/18/19 0943  HGB  --    < > 8.9*  --   --  8.6*  --   --   --   --  8.6*  --   HCT  --   --  27.3*  --   --  28.0*  --   --   --   --  28.5*  --   PLT  --   --  292  --   --  318  --   --   --   --  297  --   APTT 61*  --  104*  --  85*  --   --   --   --   --   --   --   HEPARINUNFRC 0.38  --  0.48  --  0.57 1.36*   < > 1.00*  --  1.09* 1.06* 0.60  CREATININE  --   --   --    < >  --   --   --  0.70 0.86  --  0.62  --    < > = values in this interval not displayed.    Estimated Creatinine Clearance: 111 mL/min (by C-G formula based on SCr of 0.62 mg/dL).  Assessment: Craig Owens a 58 y.o. male on apixaban PTA for recently diagnosed PE, currently continuing on heparin for extensive acute PE with RHS. Last dose of apixaban was 10/31 AM. -heparin level at goal   Goal of Therapy:  Heparin level 0.3-0.7 units/ml aPTT 66-102 seconds Monitor platelets by anticoagulation protocol: Yes   Plan:  Continue heparin at 1200 units/hr Confirm heparin level later today Monitor daily heparin level, CBC  Hildred Laser, PharmD Clinical Pharmacist **Pharmacist phone directory can now be found on [email or web address] ([email or web address]).  Listed under [place].

## 2019-07-18 NOTE — Progress Notes (Signed)
Inpatient Diabetes Program Recommendations  AACE/ADA: New Consensus Statement on Inpatient Glycemic Control (2015)  Target Ranges:  Prepandial:   less than 140 mg/dL      Peak postprandial:   less than 180 mg/dL (1-2 hours)      Critically ill patients:  140 - 180 mg/dL   Lab Results  Component Value Date   GLUCAP 234 (H) 07/18/2019   HGBA1C 8.1 (H) 06/26/2019    Review of Glycemic Control Results for Craig Owens, Craig Owens (MRN [id number]) as of 07/18/2019 09:07  Ref. Range 07/17/2019 11:25 07/17/2019 16:20 07/17/2019 20:15 07/18/2019 07:22  Glucose-Capillary Latest Ref Range: 70 - 99 mg/dL 226 (H) 394 (H) 360 (H) 234 (H)   Diabetes history: Type 2 DM Outpatient Diabetes medications: Trulicity 4.09 mg Qwk, Invokana 300 mg QD Current orders for Inpatient glycemic control: Lantus 18 units QD, Novolog 0-15 units TID,   Inpatient Diabetes Program Recommendations:    Noted patient to receive insulin adjustments made on 11/3.   In the setting of sterids, consider also adding Novolog 4 units TID (assuming patient is consuming >50% of meal).   Thanks, Bronson Curb, MSN, RNC-OB Diabetes Coordinator [phone] (8a-5p)   Consider: -Increasing Lantus to 18 units QD -Changing correction to Novolog 0-15 units TID
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
## 2019-07-18 NOTE — Progress Notes (Signed)
[place] for heparin Indication: pulmonary embolus  Allergies  Allergen Reactions  . Adhesive [Tape] Other (See Comments)    Blisters skin, peels off. Please use paper tape.   . Codeine Hives, Itching and Swelling  . Latex Hives    Patient Measurements: Height: 6\' 1"  (185.4 cm) Weight: 206 lb 9.1 oz (93.7 kg) IBW/kg (Calculated) : 79.9   Vital Signs: Temp: 97.4 F (36.3 C) (11/04 1339) Temp Source: Oral (11/04 1339) BP: 102/60 (11/04 1339) Pulse Rate: 91 (11/04 1339)  Labs: Recent Labs    07/15/19 1758  07/16/19 0511  07/16/19 1208 07/17/19 0337  07/17/19 1524 07/17/19 1923  07/18/19 0416 07/18/19 0943 07/18/19 1647  HGB  --    < > 8.9*  --   --  8.6*  --   --   --   --  8.6*  --   --   HCT  --   --  27.3*  --   --  28.0*  --   --   --   --  28.5*  --   --   PLT  --   --  292  --   --  318  --   --   --   --  297  --   --   APTT 61*  --  104*  --  85*  --   --   --   --   --   --   --   --   HEPARINUNFRC 0.38  --  0.48  --  0.57 1.36*   < > 1.00*  --    < > 1.06* 0.60 0.30  CREATININE  --   --   --    < >  --   --   --  0.70 0.86  --  0.62  --   --    < > = values in this interval not displayed.    Estimated Creatinine Clearance: 111 mL/min (by C-G formula based on SCr of 0.62 mg/dL).  Assessment: 58 yr old male on apixaban PTA for recently diagnosed PE, currently continuing on heparin for extensive acute PE with RHS. Last dose of apixaban was 10/31 AM.  Heparin level earlier today was at goal (0.60 units/ml) on heparin infusion rate of 1200 units/hr. Confirmatory heparin level drawn this afternoon ~7 hrs after earlier level today was 0.30 units/ml, which is at the low end of the goal range for this pt.  H/H, platelets stable. Per RN, no issues with IV or bleeding observed.   Goal of Therapy:  Heparin level 0.3-0.7 units/ml aPTT 66-102 seconds Monitor platelets by anticoagulation protocol: Yes   Plan:  Increase heparin  infusion to 1300 units/hr Check 6-hr heparin level Monitor daily heparin level, CBC Monitor for signs/symptoms of bleeding  Gillermina Hu, PharmD, BCPS, [facility] Clinical Pharmacist 07/18/19, 18:01 PM
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
## 2019-07-19 DIAGNOSIS — R918 Other nonspecific abnormal finding of lung field: Secondary | ICD-10-CM

## 2019-07-19 LAB — CULTURE, BLOOD (ROUTINE X 2)
Culture: NO GROWTH
Culture: NO GROWTH
Culture: NO GROWTH
Culture: NO GROWTH
Special Requests: ADEQUATE
Special Requests: ADEQUATE
Special Requests: ADEQUATE
Special Requests: ADEQUATE

## 2019-07-19 LAB — GLUCOSE, CAPILLARY
Glucose-Capillary: 200 mg/dL — ABNORMAL HIGH (ref 70–99)
Glucose-Capillary: 287 mg/dL — ABNORMAL HIGH (ref 70–99)
Glucose-Capillary: 392 mg/dL — ABNORMAL HIGH (ref 70–99)
Glucose-Capillary: 295 mg/dL — ABNORMAL HIGH (ref 70–99)

## 2019-07-19 LAB — CBC
HCT: 31.8 % — ABNORMAL LOW (ref 39.0–52.0)
Hemoglobin: 10 g/dL — ABNORMAL LOW (ref 13.0–17.0)
MCH: 26.6 pg (ref 26.0–34.0)
MCHC: 31.4 g/dL (ref 30.0–36.0)
MCV: 84.6 fL (ref 80.0–100.0)
Platelets: 325 10*3/uL (ref 150–400)
RBC: 3.76 MIL/uL — ABNORMAL LOW (ref 4.22–5.81)
RDW: 15.7 % — ABNORMAL HIGH (ref 11.5–15.5)
WBC: 20.3 10*3/uL — ABNORMAL HIGH (ref 4.0–10.5)
nRBC: 0.7 % — ABNORMAL HIGH (ref 0.0–0.2)

## 2019-07-19 LAB — HEPARIN LEVEL (UNFRACTIONATED)
Heparin Unfractionated: 0.43 IU/mL (ref 0.30–0.70)
Heparin Unfractionated: 0.38 IU/mL (ref 0.30–0.70)

## 2019-07-19 LAB — BLASTOMYCES ANTIGEN: Blastomyces Antigen: NOT DETECTED ng/mL

## 2019-07-19 MED ORDER — ALPRAZOLAM 0.5 MG PO TABS
0.5000 mg | ORAL_TABLET | Freq: Three times a day (TID) | ORAL | Status: DC | PRN
  Administered 2019-07-19 – 2019-07-24 (×5): 0.5 mg via ORAL
  Filled 2019-07-19 (×5): qty 1

## 2019-07-19 MED ORDER — GUAIFENESIN-DM 100-10 MG/5ML PO SYRP
10.0000 mL | ORAL_SOLUTION | Freq: Four times a day (QID) | ORAL | Status: DC
  Administered 2019-07-19 – 2019-07-23 (×13): 10 mL via ORAL
  Filled 2019-07-19 (×20): qty 10

## 2019-07-19 NOTE — Progress Notes (Signed)
Inpatient Diabetes Program Recommendations  AACE/ADA: New Consensus Statement on Inpatient Glycemic Control (2015)  Target Ranges:  Prepandial:   less than 140 mg/dL      Peak postprandial:   less than 180 mg/dL (1-2 hours)      Critically ill patients:  140 - 180 mg/dL   Lab Results  Component Value Date   GLUCAP 200 (H) 07/19/2019   HGBA1C 8.1 (H) 06/26/2019    Review of Glycemic Control Results for COHAN, STIPES (MRN [id number]) as of 07/19/2019 09:22  Ref. Range 07/18/2019 16:39 07/18/2019 21:15 07/18/2019 23:35 07/19/2019 07:21  Glucose-Capillary Latest Ref Range: 70 - 99 mg/dL 268 (H) 438 (H) 317 (H) 200 (H)   Diabetes history:Type 2 DM Outpatient Diabetes medications:Trulicity 8.40 mg Qwk, Invokana 300 mg QD Current orders for Inpatient glycemic control:Lantus 20 units QD, Novolog 0-15 units TID, Novolog 4 units TID  Inpatient Diabetes Program Recommendations:  Consider further increasing meal coverage to Novolog 8 units TID (assuming patient continues to consume >50% of meal).   Thanks, Bronson Curb, MSN, RNC-OB Diabetes Coordinator [phone] (8a-5p)
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
## 2019-07-19 NOTE — Progress Notes (Signed)
[place] for Heparin Indication: pulmonary embolus  Allergies  Allergen Reactions  . Adhesive [Tape] Other (See Comments)    Blisters skin, peels off. Please use paper tape.   . Codeine Hives, Itching and Swelling  . Latex Hives    Patient Measurements: Height: 6\' 1"  (185.4 cm) Weight: 206 lb 9.1 oz (93.7 kg) IBW/kg (Calculated) : 79.9   Vital Signs: Temp: 97.6 F (36.4 C) (11/05 0008) Temp Source: Oral (11/05 0008) BP: 113/70 (11/05 0008) Pulse Rate: 88 (11/05 0008)  Labs: Recent Labs    07/16/19 0511  07/16/19 1208 07/17/19 0337  07/17/19 1923  07/18/19 0416 07/18/19 0943 07/18/19 1647 07/18/19 2137 07/19/19 0049  HGB 8.9*  --   --  8.6*  --   --   --  8.6*  --   --   --   --   HCT 27.3*  --   --  28.0*  --   --   --  28.5*  --   --   --   --   PLT 292  --   --  318  --   --   --  297  --   --   --   --   APTT 104*  --  85*  --   --   --   --   --   --   --   --   --   HEPARINUNFRC 0.48  --  0.57 1.36*   < >  --    < > 1.06* 0.60 0.30  --  0.43  CREATININE  --    < >  --   --    < > 0.86  --  0.62  --   --  0.71  --    < > = values in this interval not displayed.    Estimated Creatinine Clearance: 111 mL/min (by C-G formula based on SCr of 0.71 mg/dL).  Assessment: 58 yr old male on apixaban PTA for recently diagnosed PE, currently continuing on heparin for extensive acute PE with RHS. Last dose of apixaban was 10/31 AM.  Heparin level earlier today was at goal (0.60 units/ml) on heparin infusion rate of 1200 units/hr. Confirmatory heparin level drawn this afternoon ~7 hrs after earlier level today was 0.30 units/ml, which is at the low end of the goal range for this pt.  H/H, platelets stable. Per RN, no issues with IV or bleeding observed.  11/5 AM update: heparin level therapeutic x 2  Goal of Therapy:  Heparin level 0.3-0.7 units/ml aPTT 66-102 seconds Monitor platelets by anticoagulation protocol: Yes   Plan:   Cont heparin at 1300 units/hr Daily CBC/HL Monitor for bleeding  Narda Bonds, PharmD, BCPS Clinical Pharmacist Phone: [phone]

## 2019-07-19 NOTE — Plan of Care (Signed)
  Problem: Elimination: Goal: Will not experience complications related to bowel motility Outcome: Progressing Note: No s/s of complications r/t bowel motility.  Patient states he had a BM on 11/4. Goal: Will not experience complications related to urinary retention Outcome: Progressing Note: Voiding well in urinal with good UOP.  No s/s of urinary retention noted.
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
## 2019-07-19 NOTE — Progress Notes (Signed)
[place] for heparin Indication: pulmonary embolus  Allergies  Allergen Reactions  . Adhesive [Tape] Other (See Comments)    Blisters skin, peels off. Please use paper tape.   . Codeine Hives, Itching and Swelling  . Latex Hives    Patient Measurements: Height: 6\' 1"  (185.4 cm) Weight: 204 lb 5.9 oz (92.7 kg) IBW/kg (Calculated) : 79.9 Heparin Dosing Weight: HEPARIN DW (KG): 101.2   Vital Signs: Temp: 97.7 F (36.5 C) (11/05 0429) Temp Source: Oral (11/05 0429) BP: 103/65 (11/05 0429) Pulse Rate: 84 (11/05 0429)  Labs: Recent Labs    07/16/19 1208  07/17/19 0337  07/17/19 1923  07/18/19 0416  07/18/19 1647 07/18/19 2137 07/19/19 0049 07/19/19 0513  HGB  --    < > 8.6*  --   --   --  8.6*  --   --   --   --  10.0*  HCT  --   --  28.0*  --   --   --  28.5*  --   --   --   --  31.8*  PLT  --   --  318  --   --   --  297  --   --   --   --  325  APTT 85*  --   --   --   --   --   --   --   --   --   --   --   HEPARINUNFRC 0.57  --  1.36*   < >  --    < > 1.06*   < > 0.30  --  0.43 0.38  CREATININE  --   --   --    < > 0.86  --  0.62  --   --  0.71  --   --    < > = values in this interval not displayed.    Estimated Creatinine Clearance: 111 mL/min (by C-G formula based on SCr of 0.71 mg/dL).  Assessment: [place] a 58 y.o. male on apixaban PTA for recently diagnosed PE, currently continuing on heparin for extensive acute PE with RHS. Last dose of apixaban was 10/31 AM. Plans for oral anticoagulation when patient is more stable -heparin level at goal   Goal of Therapy:  Heparin level 0.3-0.7 units/ml aPTT 66-102 seconds Monitor platelets by anticoagulation protocol: Yes   Plan:  Continue heparin at 1200 units/hr Monitor daily heparin level, CBC  Hildred Laser, PharmD Clinical Pharmacist **Pharmacist phone directory can now be found on [email or web address] ([email or web address]).  Listed under [place].

## 2019-07-20 DIAGNOSIS — J189 Pneumonia, unspecified organism: Secondary | ICD-10-CM

## 2019-07-20 DIAGNOSIS — R0902 Hypoxemia: Secondary | ICD-10-CM

## 2019-07-20 DIAGNOSIS — J984 Other disorders of lung: Secondary | ICD-10-CM

## 2019-07-20 LAB — GLUCOSE, CAPILLARY
Glucose-Capillary: 193 mg/dL — ABNORMAL HIGH (ref 70–99)
Glucose-Capillary: 362 mg/dL — ABNORMAL HIGH (ref 70–99)
Glucose-Capillary: 437 mg/dL — ABNORMAL HIGH (ref 70–99)
Glucose-Capillary: 251 mg/dL — ABNORMAL HIGH (ref 70–99)

## 2019-07-20 LAB — CBC
HCT: 32.6 % — ABNORMAL LOW (ref 39.0–52.0)
Hemoglobin: 9.9 g/dL — ABNORMAL LOW (ref 13.0–17.0)
MCH: 26.3 pg (ref 26.0–34.0)
MCHC: 30.4 g/dL (ref 30.0–36.0)
MCV: 86.7 fL (ref 80.0–100.0)
Platelets: 325 10*3/uL (ref 150–400)
RBC: 3.76 MIL/uL — ABNORMAL LOW (ref 4.22–5.81)
RDW: 15.9 % — ABNORMAL HIGH (ref 11.5–15.5)
WBC: 22.5 10*3/uL — ABNORMAL HIGH (ref 4.0–10.5)
nRBC: 0.2 % (ref 0.0–0.2)

## 2019-07-20 LAB — VANCOMYCIN, PEAK: Vancomycin Pk: 39 ug/mL (ref 30–40)

## 2019-07-20 LAB — HEPARIN LEVEL (UNFRACTIONATED)
Heparin Unfractionated: 0.16 IU/mL — ABNORMAL LOW (ref 0.30–0.70)
Heparin Unfractionated: 0.19 IU/mL — ABNORMAL LOW (ref 0.30–0.70)

## 2019-07-20 LAB — BASIC METABOLIC PANEL
Anion gap: 8 (ref 5–15)
BUN: 14 mg/dL (ref 6–20)
CO2: 26 mmol/L (ref 22–32)
Calcium: 8.6 mg/dL — ABNORMAL LOW (ref 8.9–10.3)
Chloride: 100 mmol/L (ref 98–111)
Creatinine, Ser: 0.53 mg/dL — ABNORMAL LOW (ref 0.61–1.24)
GFR calc Af Amer: >60 mL/min (ref >60)
GFR calc non Af Amer: >60 mL/min (ref >60)
Glucose, Bld: 164 mg/dL — ABNORMAL HIGH (ref 70–99)
Potassium: 4.7 mmol/L (ref 3.5–5.1)
Sodium: 134 mmol/L — ABNORMAL LOW (ref 135–145)

## 2019-07-20 LAB — MISC LABCORP TEST (SEND OUT): Labcorp test code: 183805

## 2019-07-20 LAB — VANCOMYCIN, TROUGH: Vancomycin Tr: 10 ug/mL — ABNORMAL LOW (ref 15–20)

## 2019-07-20 MED ORDER — HEPARIN BOLUS VIA INFUSION
2000.0000 [IU] | Freq: Once | INTRAVENOUS | Status: AC
  Administered 2019-07-20: 2000 [IU] via INTRAVENOUS
  Filled 2019-07-20: qty 2000

## 2019-07-20 MED ORDER — FLUTICASONE PROPIONATE 50 MCG/ACT NA SUSP
2.0000 | Freq: Every day | NASAL | Status: DC
  Administered 2019-07-20 – 2019-07-26 (×7): 2 via NASAL
  Filled 2019-07-20 (×2): qty 16

## 2019-07-20 MED ORDER — PANTOPRAZOLE SODIUM 40 MG PO TBEC
40.0000 mg | DELAYED_RELEASE_TABLET | Freq: Every day | ORAL | Status: AC
  Administered 2019-07-20 – 2019-07-25 (×6): 40 mg via ORAL
  Filled 2019-07-20 (×6): qty 1

## 2019-07-20 MED ORDER — PREDNISONE 20 MG PO TABS
30.0000 mg | ORAL_TABLET | Freq: Every day | ORAL | Status: AC
  Administered 2019-07-21 – 2019-07-24 (×4): 30 mg via ORAL
  Filled 2019-07-20 (×4): qty 1

## 2019-07-20 MED ORDER — INSULIN GLARGINE 100 UNIT/ML ~~LOC~~ SOLN
30.0000 [IU] | Freq: Every day | SUBCUTANEOUS | Status: DC
  Administered 2019-07-21: 30 [IU] via SUBCUTANEOUS
  Filled 2019-07-20: qty 0.3

## 2019-07-20 NOTE — Care Management Important Message (Signed)
Important Message  Patient Details  Name: Craig Owens MRN: [id number] Date of Birth: [birth year]   Medicare Important Message Given:  Yes     [name] 07/20/2019, 3:00 PM

## 2019-07-20 NOTE — Progress Notes (Addendum)
[place] for heparin Indication: pulmonary embolus  Allergies  Allergen Reactions  . Adhesive [Tape] Other (See Comments)    Blisters skin, peels off. Please use paper tape.   . Codeine Hives, Itching and Swelling  . Latex Hives    Patient Measurements: Height: 6\' 1"  (185.4 cm) Weight: 204 lb 5.9 oz (92.7 kg) IBW/kg (Calculated) : 79.9 Heparin Dosing Weight: 92.7 kg  Vital Signs: Temp: 98.2 F (36.8 C) (11/06 1556) Temp Source: Oral (11/06 1556) BP: 113/68 (11/06 1556) Pulse Rate: 97 (11/06 1556)  Labs: Recent Labs    07/18/19 0416  07/18/19 2137  07/19/19 0513 07/20/19 0233 07/20/19 1650  HGB 8.6*  --   --   --  10.0* 9.9*  --   HCT 28.5*  --   --   --  31.8* 32.6*  --   PLT 297  --   --   --  325 325  --   HEPARINUNFRC 1.06*   < >  --    < > 0.38 0.16* 0.19*  CREATININE 0.62  --  0.71  --   --  0.53*  --    < > = values in this interval not displayed.    Estimated Creatinine Clearance: 111 mL/min (A) (by C-G formula based on SCr of 0.53 mg/dL (L)).  Assessment: Craig Owens is a 58 y.o. male on apixaban PTA for recently diagnosed PE, currently continuing on heparin for extensive acute PE with RHS. Last dose of apixaban was 10/31 AM. Plans for oral anticoagulation when patient is more stable.  Earlier today, heparin level = 0.16 units/ml (no infusion problems noted per RN)  Heparin level ~9 hrs after heparin 2000 units IV bolus and infusion rate increase to 1500 units/hr was 0.19 units/ml, which is below the goal range for this patient. Per RN, no issues with IV or bleeding observed.  Goal of Therapy:  Heparin level 0.3-0.7 units/ml aPTT 66-102 seconds Monitor platelets by anticoagulation protocol: Yes   Plan:  Heparin 2000 units IV bolus X 1 Increase heparin infusion to 1800 units/hr Ck 6-hr heparin level Monitor heparin level, CBC daily Monitor for bleeding  Gillermina Hu, PharmD, BCPS, [facility] Clinical  Pharmacist 07/20/19, 18:44 PM

## 2019-07-20 NOTE — Progress Notes (Addendum)
Pharmacy Antibiotic Note  Craig Owens is a 58 y.o. male admitted on 06/27/2019 with SOB. Pharmacy has been consulted for vancomycin dosing for pneumonia.  Pt was initially on vancomycin and cefepime from 10/31-11/2, then changed to doxycycline. With worsening symptoms and clinical status, vancomycin and cefepime were resumed on 11/3.  Pt has been receiving vancomycin 750 mg IV Q 8 hrs; vancomycin levels drawn after 09:56 AM dose today were: peak: 39 mg/L (at 10:53), trough: 10 mg/L (at 16:50); vancomycin AUC calculated on this steady-state regimen was 475, which is with the vancomycin AUC goal range of 400-550.  WBC 22.5, afebrile; Scr 0.53; CrCl 111 ml/min; renal function stable  Plan: Continue vancomycin 750 mg IV Q 8 hrs Continue cefepime 2 g Q 8  hrs Monitor renal function, WBC, temp, clinical improvement, vancomycin levels, cultures  Height: 6\' 1"  (185.4 cm) Weight: 204 lb 5.9 oz (92.7 kg) IBW/kg (Calculated) : 79.9  Temp (24hrs), Avg:97.8 F (36.6 C), Min:97.5 F (36.4 C), Max:98.2 F (36.8 C)  Recent Labs  Lab 06/23/2019 1343  06/29/2019 1607 07/07/2019 2146 07/15/19 0145 07/15/19 0640 07/16/19 0511  07/17/19 0337 07/17/19 1524 07/17/19 1923 07/18/19 0416 07/18/19 2137 07/19/19 0513 07/20/19 0233 07/20/19 1053 07/20/19 1650  WBC  --   --   --  29.8* 27.0* 20.0* 24.9*  --  26.2*  --   --  18.5*  --  20.3* 22.5*  --   --   CREATININE  --    < >  --  0.76 0.89  --   --    < >  --  0.70 0.86 0.62 0.71  --  0.53*  --   --   LATICACIDVEN 1.9  --  1.9 1.7 1.4 1.4  --   --   --   --   --   --   --   --   --   --   --   VANCOTROUGH  --   --   --   --   --   --   --   --   --   --   --   --   --   --   --   --  10*  VANCOPEAK  --   --   --   --   --   --   --   --   --   --   --   --   --   --   --  39  --    < > = values in this interval not displayed.    Estimated Creatinine Clearance: 111 mL/min (A) (by C-G formula based on SCr of 0.53 mg/dL (L)).    Allergies   Allergen Reactions  . Adhesive [Tape] Other (See Comments)    Blisters skin, peels off. Please use paper tape.   . Codeine Hives, Itching and Swelling  . Latex Hives   Microbiology Results: 11/1 Quantiferon-TB Gold Plus: in process 11/1 Fungitell: negative 11/1 Histoplasma urinary antigen: negative 10/31 HIV: negative 10/31 Blastomyces antigen: negative 10/31 COVID: negative 10/31 Bld cx X 3: NG/final 10/31 Fungal bld cx: NG at 6 days 10/31 Urine cx: <10,000 colonies/ml insignificant growth 10/31 Expectorated sputum: few WBC (predom PMNs), NOS; cx: rate fungus (Mold) isolated, probable contaminant/colonizer 10/31 AFB smear: negative 10/31 AFB cx: in process  Gillermina Hu, PharmD, BCPS, [facility] Clinical Pharmacist 07/20/2019 6:26 PM

## 2019-07-20 NOTE — Progress Notes (Signed)
Inpatient Diabetes Program Recommendations  AACE/ADA: New Consensus Statement on Inpatient Glycemic Control (2015)  Target Ranges:  Prepandial:   less than 140 mg/dL      Peak postprandial:   less than 180 mg/dL (1-2 hours)      Critically ill patients:  140 - 180 mg/dL   Lab Results  Component Value Date   GLUCAP 362 (H) 07/20/2019   HGBA1C 8.1 (H) 06/26/2019    Review of Glycemic Control Results for Craig Owens, Craig Owens (MRN [id number]) as of 07/20/2019 13:31  Ref. Range 07/19/2019 22:08 07/20/2019 08:05 07/20/2019 11:08  Glucose-Capillary Latest Ref Range: 70 - 99 mg/dL 295 (H) 193 (H) 362 (H)   Diabetes history:Type 2 DM Outpatient Diabetes medications:Trulicity 0.16 mg Qwk, Invokana 300 mg QD Current orders for Inpatient glycemic control:Lantus 20units QD, Novolog 0-15 units TID, Novolog 4 units TID Prednisone 40 mg QAM  Inpatient Diabetes Program Recommendations:  Consider further increasing meal coverage to Novolog 8 units TID (assuming patient continues to consume >50% of meal) and increasing Lantus to 24 units QD.  Thanks, Bronson Curb, MSN, RNC-OB Diabetes Coordinator [phone] (8a-5p)
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
## 2019-07-20 NOTE — Progress Notes (Signed)
[place] for heparin Indication: pulmonary embolus  Allergies  Allergen Reactions  . Adhesive [Tape] Other (See Comments)    Blisters skin, peels off. Please use paper tape.   . Codeine Hives, Itching and Swelling  . Latex Hives    Patient Measurements: Height: 6\' 1"  (185.4 cm) Weight: 204 lb 5.9 oz (92.7 kg) IBW/kg (Calculated) : 79.9 Heparin Dosing Weight: HEPARIN DW (KG): 101.2   Vital Signs: Temp: 98 F (36.7 C) (11/06 0429) Temp Source: Oral (11/06 0429) BP: 115/63 (11/06 0429) Pulse Rate: 84 (11/06 0429)  Labs: Recent Labs    07/18/19 0416  07/18/19 2137 07/19/19 0049 07/19/19 0513 07/20/19 0233  HGB 8.6*  --   --   --  10.0* 9.9*  HCT 28.5*  --   --   --  31.8* 32.6*  PLT 297  --   --   --  325 325  HEPARINUNFRC 1.06*   < >  --  0.43 0.38 0.16*  CREATININE 0.62  --  0.71  --   --  0.53*   < > = values in this interval not displayed.    Estimated Creatinine Clearance: 111 mL/min (A) (by C-G formula based on SCr of 0.53 mg/dL (L)).  Assessment: Craig Owens a 58 y.o. male on apixaban PTA for recently diagnosed PE, currently continuing on heparin for extensive acute PE with RHS. Last dose of apixaban was 10/31 AM. Plans for oral anticoagulation when patient is more stable -heparin level = 0.16 (no infusion problems noted per RN)   Goal of Therapy:  Heparin level 0.3-0.7 units/ml aPTT 66-102 seconds Monitor platelets by anticoagulation protocol: Yes   Plan:  -Heparin bolus 2000 units then increase to 1500 units/hr -Heparin level in 6 hours and daily wth CBC daily  Hildred Laser, PharmD Clinical Pharmacist **Pharmacist phone directory can now be found on [email or web address] ([email or web address]).  Listed under [place].
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
## 2019-07-20 NOTE — Plan of Care (Signed)
  Problem: Education: Goal: Knowledge of General Education information will improve Description: Including pain rating scale, medication(s)/side effects and non-pharmacologic comfort measures Outcome: Progressing   Problem: Clinical Measurements: Goal: Will remain free from infection Outcome: Progressing Goal: Diagnostic test results will improve Outcome: Progressing Goal: Cardiovascular complication will be avoided Outcome: Progressing   Problem: Activity: Goal: Risk for activity intolerance will decrease Outcome: Progressing   Problem: Coping: Goal: Level of anxiety will decrease Outcome: Progressing   Problem: Pain Managment: Goal: General experience of comfort will improve Outcome: Progressing   Problem: Safety: Goal: Ability to remain free from injury will improve Outcome: Progressing   Problem: Skin Integrity: Goal: Risk for impaired skin integrity will decrease Outcome: Progressing   Elesa Hacker, RN

## 2019-07-20 NOTE — Progress Notes (Signed)
CBG 437.  Dr. Roger Shelter made aware.

## 2019-07-21 ENCOUNTER — Inpatient Hospital Stay (HOSPITAL_COMMUNITY): Payer: Medicare HMO

## 2019-07-21 DIAGNOSIS — I2699 Other pulmonary embolism without acute cor pulmonale: Secondary | ICD-10-CM

## 2019-07-21 DIAGNOSIS — D72829 Elevated white blood cell count, unspecified: Secondary | ICD-10-CM

## 2019-07-21 LAB — GLUCOSE, CAPILLARY
Glucose-Capillary: 203 mg/dL — ABNORMAL HIGH (ref 70–99)
Glucose-Capillary: 251 mg/dL — ABNORMAL HIGH (ref 70–99)
Glucose-Capillary: 282 mg/dL — ABNORMAL HIGH (ref 70–99)
Glucose-Capillary: 180 mg/dL — ABNORMAL HIGH (ref 70–99)

## 2019-07-21 LAB — HEPARIN LEVEL (UNFRACTIONATED)
Heparin Unfractionated: 0.3 IU/mL (ref 0.30–0.70)
Heparin Unfractionated: 0.28 IU/mL — ABNORMAL LOW (ref 0.30–0.70)

## 2019-07-21 LAB — BASIC METABOLIC PANEL
Anion gap: 9 (ref 5–15)
BUN: 17 mg/dL (ref 6–20)
CO2: 22 mmol/L (ref 22–32)
Calcium: 8.7 mg/dL — ABNORMAL LOW (ref 8.9–10.3)
Chloride: 101 mmol/L (ref 98–111)
Creatinine, Ser: 0.65 mg/dL (ref 0.61–1.24)
GFR calc Af Amer: >60 mL/min (ref >60)
GFR calc non Af Amer: >60 mL/min (ref >60)
Glucose, Bld: 232 mg/dL — ABNORMAL HIGH (ref 70–99)
Potassium: 4.9 mmol/L (ref 3.5–5.1)
Sodium: 132 mmol/L — ABNORMAL LOW (ref 135–145)

## 2019-07-21 LAB — FUNGUS CULTURE, BLOOD
Culture: NO GROWTH
Special Requests: ADEQUATE

## 2019-07-21 LAB — CBC
HCT: 30.8 % — ABNORMAL LOW (ref 39.0–52.0)
Hemoglobin: 9.7 g/dL — ABNORMAL LOW (ref 13.0–17.0)
MCH: 26.6 pg (ref 26.0–34.0)
MCHC: 31.5 g/dL (ref 30.0–36.0)
MCV: 84.6 fL (ref 80.0–100.0)
Platelets: 330 10*3/uL (ref 150–400)
RBC: 3.64 MIL/uL — ABNORMAL LOW (ref 4.22–5.81)
RDW: 15.8 % — ABNORMAL HIGH (ref 11.5–15.5)
WBC: 27.6 10*3/uL — ABNORMAL HIGH (ref 4.0–10.5)
nRBC: 0 % (ref 0.0–0.2)

## 2019-07-21 MED ORDER — APIXABAN 5 MG PO TABS
5.0000 mg | ORAL_TABLET | Freq: Two times a day (BID) | ORAL | Status: DC
  Administered 2019-07-21 – 2019-07-26 (×11): 5 mg via ORAL
  Filled 2019-07-21 (×11): qty 1

## 2019-07-21 MED ORDER — INSULIN GLARGINE 100 UNIT/ML ~~LOC~~ SOLN
35.0000 [IU] | Freq: Every day | SUBCUTANEOUS | Status: DC
  Administered 2019-07-22 – 2019-07-23 (×2): 35 [IU] via SUBCUTANEOUS
  Filled 2019-07-21 (×6): qty 0.35

## 2019-07-21 MED ORDER — METRONIDAZOLE IN NACL 5-0.79 MG/ML-% IV SOLN
500.0000 mg | Freq: Three times a day (TID) | INTRAVENOUS | Status: DC
  Administered 2019-07-22 (×2): 500 mg via INTRAVENOUS
  Filled 2019-07-21 (×3): qty 100

## 2019-07-21 MED ORDER — FUROSEMIDE 10 MG/ML IJ SOLN
60.0000 mg | Freq: Once | INTRAMUSCULAR | Status: AC
  Administered 2019-07-21: 60 mg via INTRAVENOUS
  Filled 2019-07-21: qty 6

## 2019-07-21 MED ORDER — HEPARIN BOLUS VIA INFUSION
1350.0000 [IU] | Freq: Once | INTRAVENOUS | Status: AC
  Administered 2019-07-21: 11:00:00 1350 [IU] via INTRAVENOUS
  Filled 2019-07-21: qty 1350

## 2019-07-21 MED ORDER — INSULIN ASPART 100 UNIT/ML ~~LOC~~ SOLN
8.0000 [IU] | Freq: Three times a day (TID) | SUBCUTANEOUS | Status: DC
  Administered 2019-07-21 – 2019-07-24 (×9): 8 [IU] via SUBCUTANEOUS

## 2019-07-21 NOTE — Progress Notes (Signed)
Patient is still requiring high amount of supplemental oxygen, went down from 20L to 15L O2
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
## 2019-07-21 NOTE — Progress Notes (Signed)
[place] for heparin Indication: pulmonary embolus  Allergies  Allergen Reactions  . Adhesive [Tape] Other (See Comments)    Blisters skin, peels off. Please use paper tape.   . Codeine Hives, Itching and Swelling  . Latex Hives    Patient Measurements: Height: 6\' 1"  (185.4 cm) Weight: 198 lb 6.6 oz (90 kg) IBW/kg (Calculated) : 79.9 Heparin Dosing Weight: 90kg   Vital Signs: Temp: 97.6 F (36.4 C) (11/07 0815) Temp Source: Axillary (11/07 0815) BP: 122/77 (11/07 0757) Pulse Rate: 96 (11/07 0757)  Labs: Recent Labs    07/18/19 2137  07/19/19 0513 07/20/19 0233 07/20/19 1650 07/21/19 0109 07/21/19 0933  HGB  --    < > 10.0* 9.9*  --  9.7*  --   HCT  --   --  31.8* 32.6*  --  30.8*  --   PLT  --   --  325 325  --  330  --   HEPARINUNFRC  --    < > 0.38 0.16* 0.19* 0.30 0.28*  CREATININE 0.71  --   --  0.53*  --  0.65  --    < > = values in this interval not displayed.    Estimated Creatinine Clearance: 111 mL/min (by C-G formula based on SCr of 0.65 mg/dL).  Assessment: Craig Owens is a 58 y.o. male on apixaban PTA for recently diagnosed PE on 06/26/2019. CTA on 07/09/2019 found bilateral, fairly extensive acute PE with evidence of right heart strain. Patient has been receiving IV heparin since admission on 06/19/2019 and has been therapeutic for approximately 7 of the 8 days. Since patient received 7 days of IV heparin it is appropriate to initiate the maintenance dosing of apixaban for PE at 5mg  BID or to initiate the higher dose 10mg  BID for 7 days prior to maintenance dosing. Discussed the options with Dr. Starla Link and he felt this was likely the same PE from 06/26/2019 and that it would be appropriate to initiate the maintenance dosing of apixaban.    Goal of Therapy:  Heparin level 0.3-0.7 units/ml aPTT 66-102 seconds Monitor platelets by anticoagulation protocol: Yes   Plan:  Discontinue heparin at 1800 and administer  apixaban dose at the same time - communicated this information to nursing and placed a nursing instruction order  Start apixaban 5mg  BID  Monitor S/S of bleeding   Cristela Felt, PharmD PGY1 Pharmacy Resident [employer]: [phone]

## 2019-07-21 NOTE — Progress Notes (Signed)
[place] for Heparin Indication: pulmonary embolus  Allergies  Allergen Reactions  . Adhesive [Tape] Other (See Comments)    Blisters skin, peels off. Please use paper tape.   . Codeine Hives, Itching and Swelling  . Latex Hives    Patient Measurements: Height: 6\' 1"  (185.4 cm) Weight: 204 lb 5.9 oz (92.7 kg) IBW/kg (Calculated) : 79.9 Heparin Dosing Weight: 92.7 kg  Vital Signs: Temp: 97.9 F (36.6 C) (11/06 1956) Temp Source: Oral (11/06 1956) BP: 124/68 (11/06 1956) Pulse Rate: 100 (11/06 2209)  Labs: Recent Labs    07/18/19 2137  07/19/19 0513 07/20/19 0233 07/20/19 1650 07/21/19 0109  HGB  --   --  10.0* 9.9*  --  9.7*  HCT  --   --  31.8* 32.6*  --  30.8*  PLT  --   --  325 325  --  330  HEPARINUNFRC  --    < > 0.38 0.16* 0.19* 0.30  CREATININE 0.71  --   --  0.53*  --  0.65   < > = values in this interval not displayed.    Estimated Creatinine Clearance: 111 mL/min (by C-G formula based on SCr of 0.65 mg/dL).  Assessment: Craig Owens is a 58 y.o. male on apixaban PTA for recently diagnosed PE, currently continuing on heparin for extensive acute PE with RHS. Last dose of apixaban was 10/31 AM. Plans for oral anticoagulation when patient is more stable.  Earlier today, heparin level = 0.16 units/ml (no infusion problems noted per RN)  Heparin level ~9 hrs after heparin 2000 units IV bolus and infusion rate increase to 1500 units/hr was 0.19 units/ml, which is below the goal range for this patient. Per RN, no issues with IV or bleeding observed.  11/7 AM update:  Heparin level therapeutic x 1 after rate increase  Goal of Therapy:  Heparin level 0.3-0.7 units/ml aPTT 66-102 seconds Monitor platelets by anticoagulation protocol: Yes   Plan:  Cont heparin at 1800 units/hr Confirmatory heparin level at [place], PharmD, [place] Pharmacist Phone: [phone]

## 2019-07-21 NOTE — Significant Event (Signed)
Rapid Response Event Note  Overview:Called d/t tachypnea and worsening lung sounds. Pt here with B PE and LUL cavitary lung lesion(PNA vs malignancy) Time Called: 2206 Arrival Time: 2215 Event Type: Respiratory  Initial Focused Assessment: Pt laying in the bed, alert and oriented, irritable, using accessory muscles to breathe. HR-110, BP-124/70, RR-39, SpO2-91% on HHFNC 20L and 40%. Lungs with crackles t/o.  Pt has xanax ordered for anxiety, however, pt says he isn't anxious he's just short of breath.   Interventions: HHFNC increased to 25L PCXR-No significant interval change. Large cavitary lesion is again noted in the left upper lobe with hazy bilateral ground-glass airspace opacities. PCCM to come assess pt.   60mg  lasix, EKG, and flagy ordered.  Plan of Care (if not transferred): Give lasix and monitor response, continue to monitor respiratory status closely, call RRT if further assistance needed. Event Summary: Name of Physician Notified: Dr. Oletta Darter at 2225    at          [place], Carren Rang

## 2019-07-21 NOTE — Progress Notes (Signed)
[place] for heparin Indication: pulmonary embolus  Allergies  Allergen Reactions  . Adhesive [Tape] Other (See Comments)    Blisters skin, peels off. Please use paper tape.   . Codeine Hives, Itching and Swelling  . Latex Hives    Patient Measurements: Height: 6\' 1"  (185.4 cm) Weight: 198 lb 6.6 oz (90 kg) IBW/kg (Calculated) : 79.9 Heparin Dosing Weight: 90kg   Vital Signs: Temp: 97.6 F (36.4 C) (11/07 0815) Temp Source: Axillary (11/07 0815) BP: 122/77 (11/07 0757) Pulse Rate: 96 (11/07 0757)  Labs: Recent Labs    07/18/19 2137  07/19/19 0513 07/20/19 0233 07/20/19 1650 07/21/19 0109 07/21/19 0933  HGB  --    < > 10.0* 9.9*  --  9.7*  --   HCT  --   --  31.8* 32.6*  --  30.8*  --   PLT  --   --  325 325  --  330  --   HEPARINUNFRC  --    < > 0.38 0.16* 0.19* 0.30 0.28*  CREATININE 0.71  --   --  0.53*  --  0.65  --    < > = values in this interval not displayed.    Estimated Creatinine Clearance: 111 mL/min (by C-G formula based on SCr of 0.65 mg/dL).  Assessment: Craig Owens is a 58 y.o. male on apixaban PTA for recently diagnosed PE on 06/26/2019. Pharmacy consulted for dose heparin for PE. CTA on 07/04/2019 found bilateral, fairly extensive acute PE with evidence of right heart strain.   Heparin level of 0.28 is subtherapeutic on heparin 1800 units/hr. CBC stable. Per nursing no reported line access issues or bleeding reported.   Goal of Therapy:  Heparin level 0.3-0.7 units/ml aPTT 66-102 seconds Monitor platelets by anticoagulation protocol: Yes   Plan:  Heparin bolus 1350 units  Increase heparin to 1900 units/hr Check heparin level 1700 Monitor heparin level, CBC, and S/S of bleeding daily   Cristela Felt, PharmD PGY1 Pharmacy Resident [employer]: [phone]

## 2019-07-22 ENCOUNTER — Inpatient Hospital Stay (HOSPITAL_COMMUNITY): Payer: Medicare HMO

## 2019-07-22 DIAGNOSIS — R0602 Shortness of breath: Secondary | ICD-10-CM

## 2019-07-22 LAB — BASIC METABOLIC PANEL
Anion gap: 12 (ref 5–15)
BUN: 23 mg/dL — ABNORMAL HIGH (ref 6–20)
CO2: 25 mmol/L (ref 22–32)
Calcium: 9.3 mg/dL (ref 8.9–10.3)
Chloride: 96 mmol/L — ABNORMAL LOW (ref 98–111)
Creatinine, Ser: 0.64 mg/dL (ref 0.61–1.24)
GFR calc Af Amer: >60 mL/min (ref >60)
GFR calc non Af Amer: >60 mL/min (ref >60)
Glucose, Bld: 234 mg/dL — ABNORMAL HIGH (ref 70–99)
Potassium: 4.2 mmol/L (ref 3.5–5.1)
Sodium: 133 mmol/L — ABNORMAL LOW (ref 135–145)

## 2019-07-22 LAB — CBC
HCT: 36.3 % — ABNORMAL LOW (ref 39.0–52.0)
Hemoglobin: 11.5 g/dL — ABNORMAL LOW (ref 13.0–17.0)
MCH: 26.4 pg (ref 26.0–34.0)
MCHC: 31.7 g/dL (ref 30.0–36.0)
MCV: 83.4 fL (ref 80.0–100.0)
Platelets: 275 10*3/uL (ref 150–400)
RBC: 4.35 MIL/uL (ref 4.22–5.81)
RDW: 15.9 % — ABNORMAL HIGH (ref 11.5–15.5)
WBC: 28.2 10*3/uL — ABNORMAL HIGH (ref 4.0–10.5)
nRBC: 0.1 % (ref 0.0–0.2)

## 2019-07-22 LAB — BLOOD GAS, ARTERIAL
Acid-base deficit: 0.4 mmol/L (ref 0.0–2.0)
Bicarbonate: 23.1 mmol/L (ref 20.0–28.0)
Drawn by: 44898
FIO2: 40
O2 Saturation: 55.8 %
Patient temperature: 37
pCO2 arterial: 34.1 mmHg (ref 32.0–48.0)
pH, Arterial: 7.447 (ref 7.350–7.450)
pO2, Arterial: 30.6 mmHg — CL (ref 83.0–108.0)

## 2019-07-22 LAB — GLUCOSE, CAPILLARY
Glucose-Capillary: 243 mg/dL — ABNORMAL HIGH (ref 70–99)
Glucose-Capillary: 172 mg/dL — ABNORMAL HIGH (ref 70–99)
Glucose-Capillary: 265 mg/dL — ABNORMAL HIGH (ref 70–99)
Glucose-Capillary: 240 mg/dL — ABNORMAL HIGH (ref 70–99)

## 2019-07-22 LAB — POCT I-STAT 7, (LYTES, BLD GAS, ICA,H+H)
Acid-Base Excess: 4 mmol/L — ABNORMAL HIGH (ref 0.0–2.0)
Bicarbonate: 27.6 mmol/L (ref 20.0–28.0)
Calcium, Ion: 1.25 mmol/L (ref 1.15–1.40)
HCT: 35 % — ABNORMAL LOW (ref 39.0–52.0)
Hemoglobin: 11.9 g/dL — ABNORMAL LOW (ref 13.0–17.0)
O2 Saturation: 99 %
Patient temperature: 98.9
Potassium: 4.7 mmol/L (ref 3.5–5.1)
Sodium: 129 mmol/L — ABNORMAL LOW (ref 135–145)
TCO2: 29 mmol/L (ref 22–32)
pCO2 arterial: 36.9 mmHg (ref 32.0–48.0)
pH, Arterial: 7.482 — ABNORMAL HIGH (ref 7.350–7.450)
pO2, Arterial: 137 mmHg — ABNORMAL HIGH (ref 83.0–108.0)

## 2019-07-22 LAB — MAGNESIUM: Magnesium: 1.8 mg/dL (ref 1.7–2.4)

## 2019-07-22 LAB — ECHOCARDIOGRAM LIMITED
Height: 73 in
Weight: 3129.6 oz

## 2019-07-22 MED ORDER — FUROSEMIDE 10 MG/ML IJ SOLN: 60.0000 mg | Freq: Once | INTRAMUSCULAR | Status: AC

## 2019-07-22 MED ORDER — CHLORHEXIDINE GLUCONATE CLOTH 2 % EX PADS
6.0000 | MEDICATED_PAD | Freq: Every day | CUTANEOUS | Status: DC
  Administered 2019-07-22 – 2019-07-28 (×7): 6 via TOPICAL

## 2019-07-22 MED ORDER — FUROSEMIDE 10 MG/ML IJ SOLN
INTRAMUSCULAR | Status: AC
  Administered 2019-07-22: 60 mg
  Filled 2019-07-22: qty 6

## 2019-07-22 NOTE — Progress Notes (Signed)
Patient tachypneic and tachycardic.  Left lung with diffuse crackles on auscultation.  Patient c/o SOB with increasing oxygen requirements.  Rapid response nurse notified and states she will come and assess patient.  Jodell Cipro

## 2019-07-22 NOTE — Plan of Care (Signed)
  Problem: Education: Goal: Knowledge of General Education information will improve Description: Including pain rating scale, medication(s)/side effects and non-pharmacologic comfort measures Outcome: Progressing   Problem: Clinical Measurements: Goal: Will remain free from infection Outcome: Progressing   Problem: Activity: Goal: Risk for activity intolerance will decrease Outcome: Progressing   Problem: Coping: Goal: Level of anxiety will decrease Outcome: Progressing   Problem: Pain Managment: Goal: General experience of comfort will improve Outcome: Progressing   Problem: Safety: Goal: Ability to remain free from injury will improve Outcome: Progressing   Elesa Hacker, RN

## 2019-07-22 NOTE — Progress Notes (Signed)
  Echocardiogram 2D Echocardiogram has been performed.  Craig Owens 07/22/2019, 9:57 AM

## 2019-07-22 NOTE — Progress Notes (Signed)
Was asked to see patient at bedside  Lethargic Barely responsive to stimulation  ABG is being performed at present  Saturating about 88% on high flow oxygen  Very poor air movement bilaterally  Was not doing well during the night  Mental status is definitely much worse than what was apparent yesterday   We will transfer to the ICU Follow-up on arterial blood gases Likely will require endotracheal intubation
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
## 2019-07-22 NOTE — Progress Notes (Signed)
Assisted daughter with camera/video time via elink

## 2019-07-22 NOTE — Progress Notes (Signed)
eLink Physician-Brief Progress Note Patient Name: Craig Owens DOB: 1961/03/04 MRN: [id number]   Date of Service  07/22/2019  HPI/Events of Note  Lethargic - Nursing request for ABG. Now LOC is back to baseline.   eICU Interventions  Will order ABG.     Intervention Category Major Interventions: Change in mental status - evaluation and management  [name] 07/22/2019, 9:44 PM

## 2019-07-22 NOTE — Significant Event (Signed)
Rapid Response Event Note  Overview: Respiratory Distress   Initial Focused Assessment: Received a call from nursing with concerns of patient having another episode of shortness of breath, coupled with increased WOB, and increased lethargic (which is new). RNs had paged the [facility] MD on call as well. Upon arrival, patient was quite lethargic and very fatigued. Lung sounds - diminished throughout. 91% on HHFNC 50% 30 L, RR 35-38, HR 100-120, stable BP. Patient is in moderate distress but is quite fatigued this morning.   I was quite concerned about the patient's presentation because normally patient is alert and oriented and even though patient has needed HHFNC, he has been tolerating it well as far as I can tell. Last night, when the patient had distress, he was not fatigued per notes and night nurse.   I paged the PCCM MD, MD came to bedside, decision made for urgent transfer to ICU.   Interventions: -- STAT ABG   Plan of Care: -- Urgent transfer to MICU - 2M15  Event Summary:  Call Time 0721 Arrival Time 0723 End Time 0910  Ramie Palladino R

## 2019-07-22 NOTE — Progress Notes (Signed)
Patient ID: Craig Owens, male   DOB: 04/30/1961, 58 y.o.   MRN: [id number] Patient has been transferred to ICU under PCCM's service due to worsening respiratory distress.  Hospitalist service will sign off.

## 2019-07-22 NOTE — Plan of Care (Signed)
Was called to bedside because patient was more tachypneic.  Oxygen requirements have increased slightly.  CXR showed increased infiltrate surrounding LUL cavitary lesion with some interstitial edema, not increased from prior.    Patient was mentating fine, and not tired.    - we added flagyl to regimen of vanc/cefepime in case missing anaerobic organisms in cavitary infection - also added another dose of lasix.

## 2019-07-23 LAB — BASIC METABOLIC PANEL
Anion gap: 13 (ref 5–15)
BUN: 29 mg/dL — ABNORMAL HIGH (ref 6–20)
CO2: 23 mmol/L (ref 22–32)
Calcium: 9.3 mg/dL (ref 8.9–10.3)
Chloride: 96 mmol/L — ABNORMAL LOW (ref 98–111)
Creatinine, Ser: 0.74 mg/dL (ref 0.61–1.24)
GFR calc Af Amer: >60 mL/min (ref >60)
GFR calc non Af Amer: >60 mL/min (ref >60)
Glucose, Bld: 148 mg/dL — ABNORMAL HIGH (ref 70–99)
Potassium: 5.3 mmol/L — ABNORMAL HIGH (ref 3.5–5.1)
Sodium: 132 mmol/L — ABNORMAL LOW (ref 135–145)

## 2019-07-23 LAB — GLUCOSE, CAPILLARY
Glucose-Capillary: 172 mg/dL — ABNORMAL HIGH (ref 70–99)
Glucose-Capillary: 206 mg/dL — ABNORMAL HIGH (ref 70–99)
Glucose-Capillary: 209 mg/dL — ABNORMAL HIGH (ref 70–99)
Glucose-Capillary: 168 mg/dL — ABNORMAL HIGH (ref 70–99)

## 2019-07-23 LAB — CBC
HCT: 36 % — ABNORMAL LOW (ref 39.0–52.0)
Hemoglobin: 11.4 g/dL — ABNORMAL LOW (ref 13.0–17.0)
MCH: 26.7 pg (ref 26.0–34.0)
MCHC: 31.7 g/dL (ref 30.0–36.0)
MCV: 84.3 fL (ref 80.0–100.0)
Platelets: 220 10*3/uL (ref 150–400)
RBC: 4.27 MIL/uL (ref 4.22–5.81)
RDW: 15.9 % — ABNORMAL HIGH (ref 11.5–15.5)
WBC: 36.8 10*3/uL — ABNORMAL HIGH (ref 4.0–10.5)
nRBC: 0 % (ref 0.0–0.2)

## 2019-07-23 LAB — QUANTIFERON-TB GOLD PLUS: QuantiFERON-TB Gold Plus: UNDETERMINED — AB

## 2019-07-23 LAB — QUANTIFERON-TB GOLD PLUS (RQFGPL)
QuantiFERON Mitogen Value: 0.02 IU/mL
QuantiFERON Nil Value: 0.02 IU/mL
QuantiFERON TB1 Ag Value: 0.03 IU/mL
QuantiFERON TB2 Ag Value: 0.01 IU/mL

## 2019-07-23 LAB — PROCALCITONIN: Procalcitonin: 0.35 ng/mL

## 2019-07-23 MED ORDER — ENSURE ENLIVE PO LIQD
237.0000 mL | Freq: Two times a day (BID) | ORAL | Status: DC
  Administered 2019-07-24: 237 mL via ORAL

## 2019-07-23 MED ORDER — DM-GUAIFENESIN ER 30-600 MG PO TB12
1.0000 | ORAL_TABLET | Freq: Two times a day (BID) | ORAL | Status: DC
  Administered 2019-07-23 – 2019-07-25 (×3): 1 via ORAL
  Filled 2019-07-23 (×6): qty 1

## 2019-07-23 NOTE — Progress Notes (Signed)
Patient currently on venturi mask 6L at 35%.  Patient sat is 95%, no distress noted except for patient a little tachypneic at times.  Rate is currently 21 after treatment.

## 2019-07-23 NOTE — Plan of Care (Signed)
  Problem: Clinical Measurements: Goal: Respiratory complications will improve Outcome: Progressing Note: Pt has been tolerating lower levels of oxygen since admit to ICU 11/8   Problem: Activity: Goal: Risk for activity intolerance will decrease Outcome: Progressing   Problem: Nutrition: Goal: Adequate nutrition will be maintained Outcome: Progressing   Problem: Elimination: Goal: Will not experience complications related to bowel motility Outcome: Progressing Note: Last BM 11/8  Goal: Will not experience complications related to urinary retention Outcome: Progressing   Problem: Pain Managment: Goal: General experience of comfort will improve Outcome: Progressing   Problem: Safety: Goal: Ability to remain free from injury will improve Outcome: Progressing   Problem: Skin Integrity: Goal: Risk for impaired skin integrity will decrease Outcome: Progressing
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
## 2019-07-24 ENCOUNTER — Inpatient Hospital Stay (HOSPITAL_COMMUNITY): Payer: Medicare HMO

## 2019-07-24 LAB — BASIC METABOLIC PANEL
Anion gap: 11 (ref 5–15)
BUN: 23 mg/dL — ABNORMAL HIGH (ref 6–20)
CO2: 23 mmol/L (ref 22–32)
Calcium: 9.2 mg/dL (ref 8.9–10.3)
Chloride: 96 mmol/L — ABNORMAL LOW (ref 98–111)
Creatinine, Ser: 0.56 mg/dL — ABNORMAL LOW (ref 0.61–1.24)
GFR calc Af Amer: >60 mL/min (ref >60)
GFR calc non Af Amer: >60 mL/min (ref >60)
Glucose, Bld: 136 mg/dL — ABNORMAL HIGH (ref 70–99)
Potassium: 4.2 mmol/L (ref 3.5–5.1)
Sodium: 130 mmol/L — ABNORMAL LOW (ref 135–145)

## 2019-07-24 LAB — CBC
HCT: 34.1 % — ABNORMAL LOW (ref 39.0–52.0)
Hemoglobin: 10.7 g/dL — ABNORMAL LOW (ref 13.0–17.0)
MCH: 26.4 pg (ref 26.0–34.0)
MCHC: 31.4 g/dL (ref 30.0–36.0)
MCV: 84 fL (ref 80.0–100.0)
Platelets: 190 10*3/uL (ref 150–400)
RBC: 4.06 MIL/uL — ABNORMAL LOW (ref 4.22–5.81)
RDW: 15.7 % — ABNORMAL HIGH (ref 11.5–15.5)
WBC: 34.6 10*3/uL — ABNORMAL HIGH (ref 4.0–10.5)
nRBC: 0 % (ref 0.0–0.2)

## 2019-07-24 LAB — GLUCOSE, CAPILLARY
Glucose-Capillary: 129 mg/dL — ABNORMAL HIGH (ref 70–99)
Glucose-Capillary: 333 mg/dL — ABNORMAL HIGH (ref 70–99)
Glucose-Capillary: 276 mg/dL — ABNORMAL HIGH (ref 70–99)
Glucose-Capillary: 225 mg/dL — ABNORMAL HIGH (ref 70–99)

## 2019-07-24 LAB — MAGNESIUM: Magnesium: 1.9 mg/dL (ref 1.7–2.4)

## 2019-07-24 LAB — PHOSPHORUS: Phosphorus: 3.4 mg/dL (ref 2.5–4.6)

## 2019-07-24 MED ORDER — SODIUM CHLORIDE 0.9 % IV SOLN
2.0000 g | Freq: Three times a day (TID) | INTRAVENOUS | Status: DC
  Administered 2019-07-24 – 2019-07-25 (×3): 2 g via INTRAVENOUS
  Filled 2019-07-24 (×3): qty 2

## 2019-07-24 MED ORDER — INSULIN GLARGINE 100 UNIT/ML ~~LOC~~ SOLN
45.0000 [IU] | Freq: Every day | SUBCUTANEOUS | Status: DC
  Administered 2019-07-24 – 2019-07-26 (×3): 45 [IU] via SUBCUTANEOUS
  Filled 2019-07-24 (×4): qty 0.45

## 2019-07-24 MED ORDER — ADULT MULTIVITAMIN W/MINERALS CH
1.0000 | ORAL_TABLET | Freq: Every day | ORAL | Status: DC
  Administered 2019-07-24 – 2019-07-28 (×4): 1 via ORAL
  Filled 2019-07-24 (×4): qty 1

## 2019-07-24 MED ORDER — ENSURE ENLIVE PO LIQD
237.0000 mL | Freq: Three times a day (TID) | ORAL | Status: DC
  Administered 2019-07-24 – 2019-07-29 (×6): 237 mL via ORAL

## 2019-07-24 MED ORDER — INSULIN ASPART 100 UNIT/ML ~~LOC~~ SOLN
5.0000 [IU] | Freq: Three times a day (TID) | SUBCUTANEOUS | Status: DC
  Administered 2019-07-24 – 2019-07-27 (×8): 5 [IU] via SUBCUTANEOUS

## 2019-07-24 MED ORDER — VANCOMYCIN HCL 10 G IV SOLR
2000.0000 mg | Freq: Once | INTRAVENOUS | Status: AC
  Administered 2019-07-24: 2000 mg via INTRAVENOUS
  Filled 2019-07-24 (×2): qty 2000

## 2019-07-24 MED ORDER — SODIUM CHLORIDE 0.9 % IV SOLN
INTRAVENOUS | Status: DC | PRN
  Administered 2019-07-24 – 2019-07-27 (×2): 250 mL via INTRAVENOUS

## 2019-07-24 MED ORDER — VANCOMYCIN HCL IN DEXTROSE 1-5 GM/200ML-% IV SOLN
1000.0000 mg | Freq: Two times a day (BID) | INTRAVENOUS | Status: DC
  Administered 2019-07-25: 1000 mg via INTRAVENOUS
  Filled 2019-07-24: qty 200

## 2019-07-24 NOTE — Progress Notes (Signed)
Assisted tele visit to patient with daughter.  Mcarthur Rossetti, RN

## 2019-07-24 NOTE — Plan of Care (Signed)
  Problem: Clinical Measurements: Goal: Respiratory complications will improve Outcome: Progressing Note: Pt is able to tolerate 6L Venti Mask well. He can wear nasal cannula but prefers the increased air with Venti. Will titrate oxygen down as able.   Problem: Activity: Goal: Risk for activity intolerance will decrease Outcome: Progressing Note: Pt was able to move from bed to chair with two assist. MD said he would order PT/OT due to patient weakness   Problem: Nutrition: Goal: Adequate nutrition will be maintained Outcome: Progressing   Problem: Elimination: Goal: Will not experience complications related to bowel motility Outcome: Progressing Note: Last BM 11/9 Goal: Will not experience complications related to urinary retention Outcome: Progressing

## 2019-07-24 NOTE — Progress Notes (Signed)
PROGRESS NOTE    Craig Owens  VEH:[id number] DOB: Mar 02, 1961 DOA: 06/25/2019 PCP: Lucia Gaskins, MD   Brief Narrative:  Patient is a 58 year old-year-old male with past medical history of hyperperlipidemia, diabetes,COPD, smoker who was initially admitted with shortness of breath on November 1.  He was initially hospitalized from 10/13-10/27 at [facility] hospital for acute pulmonary embolism and cavitary lung lesion concerning for possible pneumonia.  He was discharged on 4 L of oxygen and Eliquis with plan to follow-up as an outpatient for COPD.  On October 31 she presented with worsening dyspnea.  Work-up revealed worsening left-sided cavitary lung mass concerning for malignancy.  He was admitted to ICU for acute hypoxic respiratory failure requiring high flow.  He also reported significant unintentional weight loss and hypotensive blood presentation.  He was being cared by [facility] and treated with antibiotics.  Respiratory status has slowly improved but he is still on oxygen at 6 L/min.  Patient transferred to [facility] service on 07/24/2019.  Assessment & Plan:   Active Problems:   Pulmonary embolism (HCC)   Pulmonary embolus (HCC)   Cavitary pneumonia   Hypoxia   Acute hypoxic respiratory failure: Secondary to cavitary lung lesions, pulmonary infarct secondary to pulmonary embolism, COPD, hospital-acquired weakness.  PCCM following.  Currently on 6 L of oxygen per minute.  Cavitary Pneumonia: CTA showed worsening left upper lobe cavitation 8.6x 6.5x 8.4 cm.  Chest x-ray on 11/10 showed  stable bilateral lung opacities are noted, including large cavitary lesion seen in left upper lobe, most consistent with infection.He has left upper lobe cavitary mass, scattered groundglass opacity in multiple lobes due to his pulmonary function due to severe pulmonary emboli burden. Completed antibiotics course.  Blood cultures, fungal serologies, sputum culture negative.  Covid negative.  Completed steroid  course.  Continue bronchodilators.  Right upper lobe spiculated lesion :Imaging showed right upper lobe nodule 2.6x 2.1x 1.8 cm.  This is possible primary bronchogenic carcinoma.  We will investigate more than this after stabilization of his respiratory status  Submassive PE: Currently on Eliquis.  Continue oxygen supplementation as needed  Leukocytosis: Most likely steroid-induced.  Cultures are negative.  Patient is afebrile.  He has completed antibiotics course  Steroid-induced hyperglycemia: Continue current insulin regimen  Smoker: Smokes 1 to 2 pack a day.  Counseled for cessation  Debility/deconditioning:Likely has developed ICU acquired weakness. Lives alone.  He he says he does not work anymore because these disabled and walks with the help of cane.  PT recommended skilled facility.  Needs extensive physical therapy.   Nutrition Problem: Moderate Malnutrition Etiology: chronic illness(lung lesion, possibly cancer)      DVT prophylaxis:Eliquis Code Status: Full Family Communication: None present at the bedside Disposition Plan: Not medically ready.  PT recommended skilled nursing facility.  Social worker consulted   Consultants: PCCM  Procedures: Echocardiogram  Antimicrobials:  Anti-infectives (From admission, onward)   Start     Dose/Rate Route Frequency Ordered Stop   07/21/19 2330  metroNIDAZOLE (FLAGYL) IVPB 500 mg  Status:  Discontinued     500 mg 100 mL/hr over 60 Minutes Intravenous Every 8 hours 07/21/19 2300 07/22/19 0850   07/17/19 1800  vancomycin (VANCOCIN) IVPB 750 mg/150 ml premix  Status:  Discontinued     750 mg 150 mL/hr over 60 Minutes Intravenous Every 8 hours 07/17/19 1627 07/22/19 0850   07/17/19 1800  ceFEPIme (MAXIPIME) 2 g in sodium chloride 0.9 % 100 mL IVPB  Status:  Discontinued     2  g 200 mL/hr over 30 Minutes Intravenous Every 8 hours 07/17/19 1627 07/22/19 0850   07/17/19 0600  doxycycline (VIBRA-TABS) tablet 100 mg  Status:   Discontinued     100 mg Oral Every 12 hours 07/16/19 1505 07/17/19 1614   07/16/19 1800  Ampicillin-Sulbactam (UNASYN) 3 g in sodium chloride 0.9 % 100 mL IVPB  Status:  Discontinued     3 g 200 mL/hr over 30 Minutes Intravenous Every 6 hours 07/16/19 1027 07/16/19 1112   07/16/19 1200  doxycycline (VIBRAMYCIN) 100 mg in sodium chloride 0.9 % 250 mL IVPB  Status:  Discontinued     100 mg 125 mL/hr over 120 Minutes Intravenous Every 12 hours 07/16/19 1112 07/16/19 1505   07/16/19 0000  voriconazole (VFEND) 420 mg in sodium chloride 0.9 % 100 mL IVPB  Status:  Discontinued     4 mg/kg  104.3 kg 71 mL/hr over 120 Minutes Intravenous Every 12 hours 06/26/2019 2229 07/15/19 0956   07/15/19 0600  vancomycin (VANCOCIN) 1,250 mg in sodium chloride 0.9 % 250 mL IVPB  Status:  Discontinued     1,250 mg 166.7 mL/hr over 90 Minutes Intravenous Every 12 hours 06/29/2019 2116 07/16/19 1021   07/08/2019 2300  vancomycin (VANCOCIN) IVPB 1000 mg/200 mL premix  Status:  Discontinued     1,000 mg 200 mL/hr over 60 Minutes Intravenous Every 8 hours 06/17/2019 1554 07/04/2019 2116   06/19/2019 2230  doxycycline (VIBRAMYCIN) 100 mg in sodium chloride 0.9 % 250 mL IVPB  Status:  Discontinued     100 mg 125 mL/hr over 120 Minutes Intravenous Every 12 hours 07/08/2019 2103 07/16/19 1021   07/13/2019 2230  voriconazole (VFEND) 630 mg in sodium chloride 0.9 % 150 mL IVPB  Status:  Discontinued     6 mg/kg  104.3 kg 106.5 mL/hr over 120 Minutes Intravenous Every 12 hours 06/20/2019 2229 07/15/19 0956   06/24/2019 2200  piperacillin-tazobactam (ZOSYN) IVPB 3.375 g  Status:  Discontinued     3.375 g 12.5 mL/hr over 240 Minutes Intravenous Every 8 hours 07/13/2019 2116 07/16/19 1027   06/16/2019 1900  ceFEPIme (MAXIPIME) 2 g in sodium chloride 0.9 % 100 mL IVPB  Status:  Discontinued     2 g 200 mL/hr over 30 Minutes Intravenous Every 8 hours 07/03/2019 1542 06/17/2019 2116   07/12/2019 1545  vancomycin (VANCOCIN) IVPB 1000 mg/200 mL premix      1,000 mg 200 mL/hr over 60 Minutes Intravenous Every 1 hr x 2 07/01/2019 1542 06/27/2019 1913   07/13/2019 1530  ceFEPIme (MAXIPIME) 1 g in sodium chloride 0.9 % 100 mL IVPB     1 g 200 mL/hr over 30 Minutes Intravenous  Once 06/14/2019 1517 06/25/2019 1645      Subjective: Patient seen and examined the bedside this morning.  Hemodynamically stable but very weak, deconditioned, debilitated.  He was lying on his side on the bed and was hardly able to move.  Requiring 6 L of oxygen per minute,, not in acute respiratory distress.  Denies any cough or chest pain.  Objective: Vitals:   07/24/19 1000 07/24/19 1100 07/24/19 1200 07/24/19 1300  BP: 100/64 (!) 102/58 115/72 103/65  Pulse: (!) 105 (!) 103 98 (!) 102  Resp: (!) 38 (!) 27 (!) 22 (!) 36  Temp:  98 F (36.7 C)    TempSrc:  Oral    SpO2: 98% 100% 97% 96%  Weight:      Height:        Intake/Output  Summary (Last 24 hours) at 07/24/2019 1337 Last data filed at 07/24/2019 1200 Gross per 24 hour  Intake 960 ml  Output 1600 ml  Net -640 ml   Filed Weights   07/22/19 0451 07/23/19 0500 07/24/19 0500  Weight: 88.7 kg 85.9 kg 87.4 kg    Examination:  General exam: Deconditioned, debilitated, generalized weakness HEENT:PERRL,Oral mucosa moist, Ear/Nose normal on gross exam Respiratory system: Bilateral decreased air entry, more on the left Cardiovascular system: S1 & S2 heard, RRR. No JVD, murmurs, rubs, gallops or clicks. No pedal edema. Gastrointestinal system: Abdomen is nondistended, soft and nontender. No organomegaly or masses felt. Normal bowel sounds heard. Central nervous system: Alert and oriented. No focal neurological deficits. Extremities: No edema, no clubbing ,no cyanosis, distal peripheral pulses palpable. Skin: No rashes, lesions or ulcers,no icterus ,no pallor      Data Reviewed: I have personally reviewed following labs and imaging studies  CBC: Recent Labs  Lab 07/20/19 0233 07/21/19 0109 07/22/19 0358  07/22/19 2214 07/23/19 0323 07/24/19 0127  WBC 22.5* 27.6* 28.2*  --  36.8* 34.6*  HGB 9.9* 9.7* 11.5* 11.9* 11.4* 10.7*  HCT 32.6* 30.8* 36.3* 35.0* 36.0* 34.1*  MCV 86.7 84.6 83.4  --  84.3 84.0  PLT 325 330 275  --  220 706   Basic Metabolic Panel: Recent Labs  Lab 07/18/19 2137 07/20/19 0233 07/21/19 0109 07/22/19 0358 07/22/19 2214 07/23/19 0323 07/24/19 0127  NA 134* 134* 132* 133* 129* 132* 130*  K 4.2 4.7 4.9 4.2 4.7 5.3* 4.2  CL 100 100 101 96*  --  96* 96*  CO2 25 26 22 25   --  23 23  GLUCOSE 307* 164* 232* 234*  --  148* 136*  BUN 20 14 17  23*  --  29* 23*  CREATININE 0.71 0.53* 0.65 0.64  --  0.74 0.56*  CALCIUM 8.4* 8.6* 8.7* 9.3  --  9.3 9.2  MG 1.9  --   --  1.8  --   --  1.9  PHOS 2.0*  --   --   --   --   --  3.4   GFR: Estimated Creatinine Clearance: 111 mL/min (A) (by C-G formula based on SCr of 0.56 mg/dL (L)). Liver Function Tests: Recent Labs  Lab 07/18/19 0416  AST 17  ALT 20  ALKPHOS 93  BILITOT 0.2*  PROT 5.3*  ALBUMIN 1.5*   No results for input(s): LIPASE, AMYLASE in the last 168 hours. No results for input(s): AMMONIA in the last 168 hours. Coagulation Profile: No results for input(s): INR, PROTIME in the last 168 hours. Cardiac Enzymes: No results for input(s): CKTOTAL, CKMB, CKMBINDEX, TROPONINI in the last 168 hours. BNP (last 3 results) No results for input(s): PROBNP in the last 8760 hours. HbA1C: No results for input(s): HGBA1C in the last 72 hours. CBG: Recent Labs  Lab 07/23/19 1056 07/23/19 1702 07/23/19 2210 07/24/19 0741 07/24/19 1136  GLUCAP 206* 209* 168* 129* 333*   Lipid Profile: No results for input(s): CHOL, HDL, LDLCALC, TRIG, CHOLHDL, LDLDIRECT in the last 72 hours. Thyroid Function Tests: No results for input(s): TSH, T4TOTAL, FREET4, T3FREE, THYROIDAB in the last 72 hours. Anemia Panel: No results for input(s): VITAMINB12, FOLATE, FERRITIN, TIBC, IRON, RETICCTPCT in the last 72 hours. Sepsis Labs:  Recent Labs  Lab 07/23/19 0323  PROCALCITON 0.35    Recent Results (from the past 240 hour(s))  Blood culture (routine x 2)     Status: None   Collection  Time: 07/05/2019  1:43 PM   Specimen: BLOOD RIGHT ARM  Result Value Ref Range Status   Specimen Description   Final    BLOOD RIGHT ARM BOTTLES DRAWN AEROBIC AND ANAEROBIC   Special Requests Blood Culture adequate volume  Final   Culture   Final    NO GROWTH 5 DAYS Performed at [facility], [address] [place], [place] [address]    Report Status 07/19/2019 FINAL  Final  Blood culture (routine x 2)     Status: None   Collection Time: 07/05/2019  1:57 PM   Specimen: BLOOD LEFT ARM  Result Value Ref Range Status   Specimen Description BLOOD LEFT ARM BOTTLES DRAWN AEROBIC AND ANAEROBIC  Final   Special Requests Blood Culture adequate volume  Final   Culture   Final    NO GROWTH 5 DAYS Performed at [facility], [address], [place], [place] [address]    Report Status 07/19/2019 FINAL  Final  SARS Coronavirus 2 by RT PCR (hospital order, performed in [facility] hospital lab) Nasopharyngeal Nasopharyngeal Swab     Status: None   Collection Time: 06/17/2019  4:10 PM   Specimen: Nasopharyngeal Swab  Result Value Ref Range Status   SARS Coronavirus 2 NEGATIVE NEGATIVE Final    Comment: (NOTE) If result is NEGATIVE SARS-CoV-2 target nucleic acids are NOT DETECTED. The SARS-CoV-2 RNA is generally detectable in upper and lower  respiratory specimens during the acute phase of infection. The lowest  concentration of SARS-CoV-2 viral copies this assay can detect is 250  copies / mL. A negative result does not preclude SARS-CoV-2 infection  and should not be used as the sole basis for treatment or other  patient management decisions.  A negative result may occur with  improper specimen collection / handling, submission of specimen other  than nasopharyngeal swab, presence of viral mutation(s) within the  areas targeted by this assay,  and inadequate number of viral copies  (<250 copies / mL). A negative result must be combined with clinical  observations, patient history, and epidemiological information. If result is POSITIVE SARS-CoV-2 target nucleic acids are DETECTED. The SARS-CoV-2 RNA is generally detectable in upper and lower  respiratory specimens dur ing the acute phase of infection.  Positive  results are indicative of active infection with SARS-CoV-2.  Clinical  correlation with patient history and other diagnostic information is  necessary to determine patient infection status.  Positive results do  not rule out bacterial infection or co-infection with other viruses. If result is PRESUMPTIVE POSTIVE SARS-CoV-2 nucleic acids MAY BE PRESENT.   A presumptive positive result was obtained on the submitted specimen  and confirmed on repeat testing.  While 2019 novel coronavirus  (SARS-CoV-2) nucleic acids may be present in the submitted sample  additional confirmatory testing may be necessary for epidemiological  and / or clinical management purposes  to differentiate between  SARS-CoV-2 and other Sarbecovirus currently known to infect humans.  If clinically indicated additional testing with an alternate test  methodology [phone]) is advised. The SARS-CoV-2 RNA is generally  detectable in upper and lower respiratory sp ecimens during the acute  phase of infection. The expected result is Negative. Fact Sheet for Patients:  StrictlyIdeas.no Fact Sheet for Healthcare Providers: BankingDealers.co.za This test is not yet approved or cleared by the [place] FDA and has been authorized for detection and/or diagnosis of SARS-CoV-2 by FDA under an Emergency Use Authorization (EUA).  This EUA will remain in effect (meaning this  test can be used) for the duration of the COVID-19 declaration under Section 564(b)(1) of the Act, 21 U.S.C. section 360bbb-3(b)(1), unless  the authorization is terminated or revoked sooner. Performed at [facility], [address] [place], [place] [address]   Culture, blood (routine x 2)     Status: None   Collection Time: 06/18/2019  9:45 PM   Specimen: BLOOD LEFT ARM  Result Value Ref Range Status   Specimen Description BLOOD LEFT ARM  Final   Special Requests   Final    BOTTLES DRAWN AEROBIC ONLY Blood Culture adequate volume   Culture   Final    NO GROWTH 5 DAYS Performed at [facility], [place] [address] [place], [place] [address]    Report Status 07/19/2019 FINAL  Final  Blastomyces Antigen     Status: None   Collection Time: 07/13/2019  9:46 PM   Specimen: Blood  Result Value Ref Range Status   Blastomyces Antigen None Detected None Detected ng/mL Final    Comment: (NOTE) Results reported as ng/mL in 0.2 - 14.7 ng/mL range Results above the limit of detection but below 0.2 ng/mL are reported as 'Positive, Below the Limit of Quantification' Results above 14.7 ng/mL are reported as 'Positive, Above the Limit of Quantification'    Specimen Type SERUM  Final    Comment: (NOTE) Performed At: [facility] [place], [place] [id number] Noralyn Pick MD WG:[id number]   Culture, blood (routine x 2)     Status: None   Collection Time: 07/05/2019  9:50 PM   Specimen: BLOOD RIGHT HAND  Result Value Ref Range Status   Specimen Description BLOOD RIGHT HAND  Final   Special Requests   Final    BOTTLES DRAWN AEROBIC ONLY Blood Culture adequate volume   Culture   Final    NO GROWTH 5 DAYS Performed at [facility], [place] [address] [place], [place] [address]    Report Status 07/19/2019 FINAL  Final  Fungus culture, blood     Status: None   Collection Time: 06/28/2019  9:50 PM   Specimen: BLOOD RIGHT HAND  Result Value Ref Range Status   Specimen Description BLOOD RIGHT HAND  Final   Special Requests   Final    BOTTLES DRAWN AEROBIC ONLY Blood Culture adequate volume   Culture    Final    NO GROWTH 7 DAYS NO FUNGUS ISOLATED Performed at [facility], [place] [address] [place], [place] [address]    Report Status 07/21/2019 FINAL  Final  Culture, Urine     Status: Abnormal   Collection Time: 07/05/2019 10:33 PM   Specimen: Urine, Random  Result Value Ref Range Status   Specimen Description URINE, RANDOM  Final   Special Requests NONE  Final   Culture (A)  Final    <10,000 COLONIES/mL INSIGNIFICANT GROWTH Performed at [facility], [place] [address] [place], [place] [address]    Report Status 07/16/2019 FINAL  Final  Acid Fast Smear (AFB)     Status: None   Collection Time: 06/29/2019 11:01 PM   Specimen: Sputum  Result Value Ref Range Status   AFB Specimen Processing Concentration  Final   Acid Fast Smear Negative  Final    Comment: (NOTE) Performed At: [facility] [place], [place] [id number] Rush Farmer MD DG:[id number]    Source (AFB) EXPECTORATED SPUTUM  Final    Comment: Performed at [facility], [address] [place], [place] [address]  Expectorated sputum assessment w rflx to resp cult     Status: None   Collection Time: 06/16/2019 11:01 PM   Specimen: Expectorated Sputum  Result Value Ref Range Status   Specimen Description EXPECTORATED SPUTUM  Final   Special Requests NONE  Final   Sputum evaluation   Final    THIS SPECIMEN IS ACCEPTABLE FOR SPUTUM CULTURE Performed at [facility], [place] [address], [place], [place] [address]    Report Status 07/15/2019 FINAL  Final  Culture, respiratory     Status: None   Collection Time: 07/03/2019 11:01 PM  Result Value Ref Range Status   Specimen Description EXPECTORATED SPUTUM  Final   Special Requests NONE Reflexed from [id number]  Final   Gram Stain   Final    FEW WBC PRESENT, PREDOMINANTLY PMN NO ORGANISMS SEEN Performed at [facility], [place] [address] [place], [place] [address]    Culture   Final    RARE FUNGUS (MOLD) ISOLATED, PROBABLE  CONTAMINANT/COLONIZER (SAPROPHYTE). CONTACT MICROBIOLOGY IF FURTHER IDENTIFICATION REQUIRED [phone].   Report Status 07/17/2019 FINAL  Final         Radiology Studies: Dg Chest Port 1 View  Result Date: 07/24/2019 CLINICAL DATA:  Shortness of breath. EXAM: PORTABLE CHEST 1 VIEW COMPARISON:  July 22, 2019. FINDINGS: The heart size and mediastinal contours are within normal limits. No pneumothorax or pleural effusion is noted. Stable large cavitary abnormality with surrounding inflammation is noted in left upper lobe. Stable other bilateral lung opacities are noted concerning for inflammation. The visualized skeletal structures are unremarkable. IMPRESSION: Stable bilateral lung opacities are noted, including large cavitary lesion seen in left upper lobe, most consistent with infection. Electronically Signed   By: Marijo Conception M.D.   On: 07/24/2019 10:16        Scheduled Meds: . apixaban  5 mg Oral BID  . arformoterol  15 mcg Nebulization BID  . budesonide (PULMICORT) nebulizer solution  0.5 mg Nebulization BID  . Chlorhexidine Gluconate Cloth  6 each Topical Daily  . dextromethorphan-guaiFENesin  1 tablet Oral BID  . feeding supplement (ENSURE ENLIVE)  237 mL Oral TID BM  . fluticasone  2 spray Each Nare Daily  . gabapentin  100 mg Oral TID  . insulin aspart  0-15 Units Subcutaneous TID WC  . insulin aspart  0-5 Units Subcutaneous QHS  . insulin aspart  5 Units Subcutaneous TID WC  . insulin glargine  45 Units Subcutaneous Daily  . ipratropium-albuterol  3 mL Nebulization BID  . mouth rinse  15 mL Mouth Rinse BID  . multivitamin with minerals  1 tablet Oral Daily  . nicotine  14 mg Transdermal Daily  . pantoprazole  40 mg Oral Daily   Continuous Infusions:   LOS: 10 days    Time spent: 35 mins.More than 50% of that time was spent in counseling and/or coordination of care.      Shelly Coss, MD [facility] Pager [phone]  If 7PM-7AM, please  contact night-coverage [email or web address] Password [email or web address] 07/24/2019, 1:37 PM

## 2019-07-24 NOTE — Progress Notes (Signed)
Pharmacy Antibiotic Note  Craig Owens is a 58 y.o. male admitted on 07/07/2019 with dyspnea with submassive PE and pulmonary emboli, cavitary pna tx with cefepime/vanc until 11/8.  Pharmacy has been consulted for vancomycin and cefepime dosing.  Plan: Vancomycin 2000mg  IV x 1, then 1000mg  IV every 12 hours Goal AUC 400-550. Expected AUC: 474 SCr used: 0.8 Cefepime 2g IV ever 8 hours Add MRSA PCR Monitor renal function, MRSA PCR and clinical progression to narrow Vancomycin levels as needed  Height: 6\' 1"  (185.4 cm) Weight: 192 lb 10.9 oz (87.4 kg) IBW/kg (Calculated) : 79.9  Temp (24hrs), Avg:97.5 F (36.4 C), Min:97 F (36.1 C), Max:98 F (36.7 C)  Recent Labs  Lab 07/20/19 0233 07/20/19 1053 07/20/19 1650 07/21/19 0109 07/22/19 0358 07/23/19 0323 07/24/19 0127  WBC 22.5*  --   --  27.6* 28.2* 36.8* 34.6*  CREATININE 0.53*  --   --  0.65 0.64 0.74 0.56*  VANCOTROUGH  --   --  10*  --   --   --   --   VANCOPEAK  --  39  --   --   --   --   --     Estimated Creatinine Clearance: 111 mL/min (A) (by C-G formula based on SCr of 0.56 mg/dL (L)).    Allergies  Allergen Reactions  . Adhesive [Tape] Other (See Comments)    Blisters skin, peels off. Please use paper tape.   . Codeine Hives, Itching and Swelling  . Latex Hives   Metronidazole 11/7 >> 11/8 10/31 cefepime >> 10/31; 11/4>>11/8 10/31 vancomycin >> 11/2; 11/4 10/31 Zosyn >> 11/2 10/31 Doxy >> 11/4  10/31 BCx: ngtd 10/31 MRSA PCR: cancelled  10/31 Expectorated sputum - rare fungus, likely contaminant 10/31 strep pneumo - neg 10/31 covid - neg 10/31 blastomyces antigen - none  10/31 galactomannan - wnl 10/31 UC - neg 11/1 Quant gold - ip  Bertis Ruddy, PharmD Clinical Pharmacist Please check AMION for all [place] numbers 07/24/2019 3:39 PM
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
## 2019-07-25 DIAGNOSIS — E440 Moderate protein-calorie malnutrition: Secondary | ICD-10-CM | POA: Diagnosis present

## 2019-07-25 LAB — GLUCOSE, CAPILLARY
Glucose-Capillary: 265 mg/dL — ABNORMAL HIGH (ref 70–99)
Glucose-Capillary: 282 mg/dL — ABNORMAL HIGH (ref 70–99)
Glucose-Capillary: 75 mg/dL (ref 70–99)
Glucose-Capillary: 237 mg/dL — ABNORMAL HIGH (ref 70–99)

## 2019-07-25 LAB — BASIC METABOLIC PANEL
Anion gap: 11 (ref 5–15)
BUN: 21 mg/dL — ABNORMAL HIGH (ref 6–20)
CO2: 23 mmol/L (ref 22–32)
Calcium: 9.2 mg/dL (ref 8.9–10.3)
Chloride: 99 mmol/L (ref 98–111)
Creatinine, Ser: 0.52 mg/dL — ABNORMAL LOW (ref 0.61–1.24)
GFR calc Af Amer: >60 mL/min (ref >60)
GFR calc non Af Amer: >60 mL/min (ref >60)
Glucose, Bld: 133 mg/dL — ABNORMAL HIGH (ref 70–99)
Potassium: 4.4 mmol/L (ref 3.5–5.1)
Sodium: 133 mmol/L — ABNORMAL LOW (ref 135–145)

## 2019-07-25 LAB — CBC
HCT: 32.6 % — ABNORMAL LOW (ref 39.0–52.0)
Hemoglobin: 10.2 g/dL — ABNORMAL LOW (ref 13.0–17.0)
MCH: 26.8 pg (ref 26.0–34.0)
MCHC: 31.3 g/dL (ref 30.0–36.0)
MCV: 85.6 fL (ref 80.0–100.0)
Platelets: 180 10*3/uL (ref 150–400)
RBC: 3.81 MIL/uL — ABNORMAL LOW (ref 4.22–5.81)
RDW: 15.8 % — ABNORMAL HIGH (ref 11.5–15.5)
WBC: 29.3 10*3/uL — ABNORMAL HIGH (ref 4.0–10.5)
nRBC: 0 % (ref 0.0–0.2)

## 2019-07-25 MED ORDER — AMOXICILLIN-POT CLAVULANATE 875-125 MG PO TABS
1.0000 | ORAL_TABLET | Freq: Two times a day (BID) | ORAL | Status: DC
  Administered 2019-07-25 – 2019-07-26 (×4): 1 via ORAL
  Filled 2019-07-25 (×5): qty 1

## 2019-07-25 MED ORDER — GUAIFENESIN ER 600 MG PO TB12
1200.0000 mg | ORAL_TABLET | Freq: Two times a day (BID) | ORAL | Status: DC
  Administered 2019-07-25 – 2019-07-26 (×4): 1200 mg via ORAL
  Filled 2019-07-25 (×6): qty 2
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
## 2019-07-25 NOTE — Progress Notes (Addendum)
PROGRESS NOTE    Craig Owens  TFT:[id number] DOB: 1961/08/10 DOA: 06/21/2019 PCP: Lucia Gaskins, MD   Brief Narrative:  Patient is a 58 year old-year-old male with past medical history of hyperperlipidemia, diabetes,COPD, smoker who was initially admitted with shortness of breath on November 1.  He was initially hospitalized from 10/13-10/27 at [facility] hospital for acute pulmonary embolism and cavitary lung lesion concerning for possible pneumonia.  He was discharged on 4 L of oxygen and Eliquis with plan to follow-up as an outpatient for COPD.  On October 31 she presented with worsening dyspnea.  Work-up revealed worsening left-sided cavitary lung mass concerning for malignancy.  He also reported significant unintentional weight loss and hypotensive blood presentation. He was admitted to ICU for acute hypoxic respiratory failure requiring high flow.   He was being cared by [facility] and treated with antibiotics.  Respiratory status has slowly improved ans he is on oxygen at 4 L/min.  Patient transferred to [facility] service on 07/24/2019.  Assessment & Plan:   Active Problems:   Pulmonary embolism (HCC)   Pulmonary embolus (HCC)   Cavitary pneumonia   Hypoxia   Malnutrition of moderate degree   Acute hypoxic respiratory failure: Secondary to cavitary lung lesions, pulmonary infarct secondary to pulmonary embolism, COPD, hospital-acquired weakness.  PCCM following.  Currently on 4 L of oxygen per minute.  Cavitary Pneumonia: CTA showed worsening left upper lobe cavitation 8.6x 6.5x 8.4 cm.  Chest x-ray on 11/10 showed  stable bilateral lung opacities  including large cavitary lesion seen in left upper lobe, most consistent with infection.Left upper lobe cavitary mass suspected to be from pulmonary infarction due to severe pulmonary emboli burden but PNA was also possible. He was treated with antibiotics but again has been started on augmentin as per PCCM.  Blood cultures, fungal serologies, sputum  culture negative.  Covid negative.  Completed steroid course.  Continue bronchodilators. Continue to wean down oxygen.  Right upper lobe spiculated lesion :Imaging showed right upper lobe nodule 2.6x 2.1x 1.8 cm.  This is possible primary bronchogenic carcinoma.  Plan to  investigate more on this after stabilization of his respiratory status.  Submassive PE: Currently on Eliquis.  Continue oxygen supplementation as needed.Echo showed Left ventricular ejection fraction of  55 to 60%.  Leukocytosis: Most likely steroid-induced.  Cultures are negative.  Patient is afebrile. Continue to monitor the trend  Steroid-induced hyperglycemia: Continue current insulin regimen  Smoker: Smokes 1 to 2 pack a day.  Counseled for cessation  Debility/deconditioning:Likely has developed ICU acquired weakness. Lives alone.  He he says he does not work anymore because these disabled and walks with the help of cane.  PT recommended skilled facility.  Needs extensive physical therapy.SW following   Nutrition Problem: Moderate Malnutrition Etiology: chronic illness(lung lesion, possibly cancer)      DVT prophylaxis:Eliquis Code Status: Full Family Communication: I called the daughter on  phone for update Disposition Plan: Not medically ready.  Anticipate slow recovery .He lives alone and says he is disabled.  PT recommended skilled nursing facility but expected disposition is home with home health   Consultants: PCCM  Procedures: Echocardiogram  Antimicrobials:  Anti-infectives (From admission, onward)   Start     Dose/Rate Route Frequency Ordered Stop   07/25/19 1400  amoxicillin-clavulanate (AUGMENTIN) 875-125 MG per tablet 1 tablet     1 tablet Oral Every 12 hours 07/25/19 1100     07/25/19 0500  vancomycin (VANCOCIN) IVPB 1000 mg/200 mL premix  Status:  Discontinued  1,000 mg 200 mL/hr over 60 Minutes Intravenous Every 12 hours 07/24/19 1554 07/25/19 1053   07/24/19 1600  ceFEPIme (MAXIPIME) 2  g in sodium chloride 0.9 % 100 mL IVPB  Status:  Discontinued     2 g 200 mL/hr over 30 Minutes Intravenous Every 8 hours 07/24/19 1547 07/25/19 1053   07/24/19 1600  vancomycin (VANCOCIN) 2,000 mg in sodium chloride 0.9 % 500 mL IVPB     2,000 mg 250 mL/hr over 120 Minutes Intravenous  Once 07/24/19 1547 07/24/19 2037   07/21/19 2330  metroNIDAZOLE (FLAGYL) IVPB 500 mg  Status:  Discontinued     500 mg 100 mL/hr over 60 Minutes Intravenous Every 8 hours 07/21/19 2300 07/22/19 0850   07/17/19 1800  vancomycin (VANCOCIN) IVPB 750 mg/150 ml premix  Status:  Discontinued     750 mg 150 mL/hr over 60 Minutes Intravenous Every 8 hours 07/17/19 1627 07/22/19 0850   07/17/19 1800  ceFEPIme (MAXIPIME) 2 g in sodium chloride 0.9 % 100 mL IVPB  Status:  Discontinued     2 g 200 mL/hr over 30 Minutes Intravenous Every 8 hours 07/17/19 1627 07/22/19 0850   07/17/19 0600  doxycycline (VIBRA-TABS) tablet 100 mg  Status:  Discontinued     100 mg Oral Every 12 hours 07/16/19 1505 07/17/19 1614   07/16/19 1800  Ampicillin-Sulbactam (UNASYN) 3 g in sodium chloride 0.9 % 100 mL IVPB  Status:  Discontinued     3 g 200 mL/hr over 30 Minutes Intravenous Every 6 hours 07/16/19 1027 07/16/19 1112   07/16/19 1200  doxycycline (VIBRAMYCIN) 100 mg in sodium chloride 0.9 % 250 mL IVPB  Status:  Discontinued     100 mg 125 mL/hr over 120 Minutes Intravenous Every 12 hours 07/16/19 1112 07/16/19 1505   07/16/19 0000  voriconazole (VFEND) 420 mg in sodium chloride 0.9 % 100 mL IVPB  Status:  Discontinued     4 mg/kg  104.3 kg 71 mL/hr over 120 Minutes Intravenous Every 12 hours 07/13/2019 2229 07/15/19 0956   07/15/19 0600  vancomycin (VANCOCIN) 1,250 mg in sodium chloride 0.9 % 250 mL IVPB  Status:  Discontinued     1,250 mg 166.7 mL/hr over 90 Minutes Intravenous Every 12 hours 07/10/2019 2116 07/16/19 1021   07/13/2019 2300  vancomycin (VANCOCIN) IVPB 1000 mg/200 mL premix  Status:  Discontinued     1,000 mg 200 mL/hr  over 60 Minutes Intravenous Every 8 hours 06/29/2019 1554 07/07/2019 2116   06/15/2019 2230  doxycycline (VIBRAMYCIN) 100 mg in sodium chloride 0.9 % 250 mL IVPB  Status:  Discontinued     100 mg 125 mL/hr over 120 Minutes Intravenous Every 12 hours 06/15/2019 2103 07/16/19 1021   07/13/2019 2230  voriconazole (VFEND) 630 mg in sodium chloride 0.9 % 150 mL IVPB  Status:  Discontinued     6 mg/kg  104.3 kg 106.5 mL/hr over 120 Minutes Intravenous Every 12 hours 06/30/2019 2229 07/15/19 0956   06/19/2019 2200  piperacillin-tazobactam (ZOSYN) IVPB 3.375 g  Status:  Discontinued     3.375 g 12.5 mL/hr over 240 Minutes Intravenous Every 8 hours 07/03/2019 2116 07/16/19 1027   07/02/2019 1900  ceFEPIme (MAXIPIME) 2 g in sodium chloride 0.9 % 100 mL IVPB  Status:  Discontinued     2 g 200 mL/hr over 30 Minutes Intravenous Every 8 hours 07/02/2019 1542 06/24/2019 2116   07/13/2019 1545  vancomycin (VANCOCIN) IVPB 1000 mg/200 mL premix     1,000 mg  200 mL/hr over 60 Minutes Intravenous Every 1 hr x 2 06/17/2019 1542 06/21/2019 1913   07/04/2019 1530  ceFEPIme (MAXIPIME) 1 g in sodium chloride 0.9 % 100 mL IVPB     1 g 200 mL/hr over 30 Minutes Intravenous  Once 06/17/2019 1517 07/06/2019 1645      Subjective: Patient seen and examined the bedside this morning.  Hemodynamically stable.  Continues to remain weak, deconditioned.  Was sitting on the chair, coughing.  Currently on 4 L of oxygen per minute.  He says he feels little better today.  Objective: Vitals:   07/25/19 1000 07/25/19 1100 07/25/19 1200 07/25/19 1300  BP: (!) 109/56 94/62 (!) 133/100 (!) 109/45  Pulse: (!) 108 (!) 121 (!) 114 (!) 115  Resp: (!) 36 (!) 37 (!) 27 (!) 34  Temp:      TempSrc:      SpO2: 95% 97% 97% 91%  Weight:      Height:        Intake/Output Summary (Last 24 hours) at 07/25/2019 1438 Last data filed at 07/25/2019 1300 Gross per 24 hour  Intake 1848.03 ml  Output 1150 ml  Net 698.03 ml   Filed Weights   07/23/19 0500 07/24/19 0500  07/25/19 0500  Weight: 85.9 kg 87.4 kg 87.6 kg    Examination:   General exam: Very deconditioned, debilitated, generalized weakness, appears older than age HEENT:PERRL,Oral mucosa moist, Ear/Nose normal on gross exam Respiratory system: Bilateral diminished breath sounds more on the left Cardiovascular system: S1 & S2 heard, RRR. No JVD, murmurs, rubs, gallops or clicks. Gastrointestinal system: Abdomen is nondistended, soft and nontender. No organomegaly or masses felt. Normal bowel sounds heard. Central nervous system: Alert and oriented. No focal neurological deficits. Extremities: No edema, no clubbing ,no cyanosis, distal peripheral pulses palpable. Skin: No rashes, lesions or ulcers,no icterus ,no pallor    Data Reviewed: I have personally reviewed following labs and imaging studies  CBC: Recent Labs  Lab 07/21/19 0109 07/22/19 0358 07/22/19 2214 07/23/19 0323 07/24/19 0127 07/25/19 0425  WBC 27.6* 28.2*  --  36.8* 34.6* 29.3*  HGB 9.7* 11.5* 11.9* 11.4* 10.7* 10.2*  HCT 30.8* 36.3* 35.0* 36.0* 34.1* 32.6*  MCV 84.6 83.4  --  84.3 84.0 85.6  PLT 330 275  --  220 190 025   Basic Metabolic Panel: Recent Labs  Lab 07/18/19 2137  07/21/19 0109 07/22/19 0358 07/22/19 2214 07/23/19 0323 07/24/19 0127 07/25/19 0425  NA 134*   < > 132* 133* 129* 132* 130* 133*  K 4.2   < > 4.9 4.2 4.7 5.3* 4.2 4.4  CL 100   < > 101 96*  --  96* 96* 99  CO2 25   < > 22 25  --  23 23 23   GLUCOSE 307*   < > 232* 234*  --  148* 136* 133*  BUN 20   < > 17 23*  --  29* 23* 21*  CREATININE 0.71   < > 0.65 0.64  --  0.74 0.56* 0.52*  CALCIUM 8.4*   < > 8.7* 9.3  --  9.3 9.2 9.2  MG 1.9  --   --  1.8  --   --  1.9  --   PHOS 2.0*  --   --   --   --   --  3.4  --    < > = values in this interval not displayed.   GFR: Estimated Creatinine Clearance: 111 mL/min (A) (by  C-G formula based on SCr of 0.52 mg/dL (L)). Liver Function Tests: No results for input(s): AST, ALT, ALKPHOS, BILITOT,  PROT, ALBUMIN in the last 168 hours. No results for input(s): LIPASE, AMYLASE in the last 168 hours. No results for input(s): AMMONIA in the last 168 hours. Coagulation Profile: No results for input(s): INR, PROTIME in the last 168 hours. Cardiac Enzymes: No results for input(s): CKTOTAL, CKMB, CKMBINDEX, TROPONINI in the last 168 hours. BNP (last 3 results) No results for input(s): PROBNP in the last 8760 hours. HbA1C: No results for input(s): HGBA1C in the last 72 hours. CBG: Recent Labs  Lab 07/24/19 1136 07/24/19 1704 07/24/19 2138 07/25/19 0758 07/25/19 1157  GLUCAP 333* 276* 225* 265* 282*   Lipid Profile: No results for input(s): CHOL, HDL, LDLCALC, TRIG, CHOLHDL, LDLDIRECT in the last 72 hours. Thyroid Function Tests: No results for input(s): TSH, T4TOTAL, FREET4, T3FREE, THYROIDAB in the last 72 hours. Anemia Panel: No results for input(s): VITAMINB12, FOLATE, FERRITIN, TIBC, IRON, RETICCTPCT in the last 72 hours. Sepsis Labs: Recent Labs  Lab 07/23/19 0323  PROCALCITON 0.35    No results found for this or any previous visit (from the past 240 hour(s)).       Radiology Studies: Dg Chest Port 1 View  Result Date: 07/24/2019 CLINICAL DATA:  Shortness of breath. EXAM: PORTABLE CHEST 1 VIEW COMPARISON:  July 22, 2019. FINDINGS: The heart size and mediastinal contours are within normal limits. No pneumothorax or pleural effusion is noted. Stable large cavitary abnormality with surrounding inflammation is noted in left upper lobe. Stable other bilateral lung opacities are noted concerning for inflammation. The visualized skeletal structures are unremarkable. IMPRESSION: Stable bilateral lung opacities are noted, including large cavitary lesion seen in left upper lobe, most consistent with infection. Electronically Signed   By: Marijo Conception M.D.   On: 07/24/2019 10:16        Scheduled Meds: . amoxicillin-clavulanate  1 tablet Oral Q12H  . apixaban  5 mg  Oral BID  . arformoterol  15 mcg Nebulization BID  . budesonide (PULMICORT) nebulizer solution  0.5 mg Nebulization BID  . Chlorhexidine Gluconate Cloth  6 each Topical Daily  . feeding supplement (ENSURE ENLIVE)  237 mL Oral TID BM  . fluticasone  2 spray Each Nare Daily  . gabapentin  100 mg Oral TID  . guaiFENesin  1,200 mg Oral BID  . insulin aspart  0-15 Units Subcutaneous TID WC  . insulin aspart  0-5 Units Subcutaneous QHS  . insulin aspart  5 Units Subcutaneous TID WC  . insulin glargine  45 Units Subcutaneous Daily  . ipratropium-albuterol  3 mL Nebulization BID  . mouth rinse  15 mL Mouth Rinse BID  . multivitamin with minerals  1 tablet Oral Daily   Continuous Infusions: . sodium chloride Stopped (07/24/19 1727)     LOS: 11 days    Time spent: 35 mins.More than 50% of that time was spent in counseling and/or coordination of care.      Shelly Coss, MD [facility] Pager [phone]  If 7PM-7AM, please contact night-coverage [email or web address] Password [facility] 07/25/2019, 2:38 PM

## 2019-07-25 NOTE — Progress Notes (Signed)
Occupational Therapy Evaluation Patient Details Name: Craig Owens MRN: [id number] DOB: January 09, 1961 Today's Date: 07/25/2019    History of Present Illness Craig Owens is a 58 y.o. male with medical history of COPD, tobacco abuse, nonobstructive CAD, THC use, remote stroke presenting with acute onset of shortness of breath and chest discomfort. Work up revealed pulmonary embolus and L sided lung mass.   Clinical Impression   PTA, pt lived alone in one story home and reports independence with all ADLs. Pt presenting with decreased strength, activity tolerance, balance, safety, and increase pain. Pt requested to return to bed due to increased pain, required mod A +2 to power up to stand. Once standing, pt's L knee buckled, requiring knee block. Required max A +2 to remain standing and pivot to the bed. Pt requiring supervision for UB ADLs, and max A +2 for LB ADLs. Pt would benefit from continued OT acutely to address deficits and facilitate safe dc. Recommend dc to SNF to increase safe performance of ADLs.    Follow Up Recommendations  Supervision/Assistance - 24 hour;SNF    Equipment Recommendations  Other (comment);3 in 1 bedside commode(defer to next venue)    Recommendations for Other Services PT consult     Precautions / Restrictions Precautions Precautions: Fall Restrictions Weight Bearing Restrictions: No      Mobility Bed Mobility Overal bed mobility: Needs Assistance Bed Mobility: Sit to Sidelying         Sit to sidelying: Mod assist General bed mobility comments: Pt required mod A to raise legs onto EOB. Pt requested to remain sidelying for comfort. Therapist placed pillow between pt's knees to decrease pain  Transfers Overall transfer level: Needs assistance Equipment used: 2 person hand held assist Transfers: Sit to/from [employer] Sit to Stand: Mod assist;+2 physical assistance;+2 safety/equipment;Max assist Stand pivot transfers: Max  assist;+2 physical assistance;+2 safety/equipment       General transfer comment: Pt required mod A +2 to power up into standing. Once standing, pt's L knee buckled requiring L knee block and max A +2 to remain standing. Pt required max A +2 with L knee block to pivot to the bed    Balance Overall balance assessment: Needs assistance Sitting-balance support: Feet supported;No upper extremity supported Sitting balance-Leahy Scale: Poor Sitting balance - Comments: pt required external support to maintain sitting balance EOB   Standing balance support: During functional activity;No upper extremity supported Standing balance-Leahy Scale: Poor Standing balance comment: Pt required L knee block and max A +2 to maintain standing balance                           ADL either performed or assessed with clinical judgement   ADL Overall ADL's : Needs assistance/impaired Eating/Feeding: Independent;Sitting   Grooming: Supervision/safety;Sitting   Upper Body Bathing: Supervision/ safety;Sitting   Lower Body Bathing: Sit to/from stand;Maximal assistance   Upper Body Dressing : Supervision/safety;Sitting   Lower Body Dressing: Sit to/from stand;Maximal assistance Lower Body Dressing Details (indicate cue type and reason): Pt reporting he could not lean forward to pull up socks. required max A to adjust socks, and mod A +2 to power up to stand. Required max A +2 to maintain balance in standing Toilet Transfer: Maximal assistance;Moderate assistance;+2 for physical assistance;+2 for safety/equipment;Stand-pivot Toilet Transfer Details (indicate cue type and reason): Pt required mod A +2 to power up into standing. Once in standing, L knee buckled, requiring knee block to maintain standing.  Required max A +2 to pivot to bed Toileting- Clothing Manipulation and Hygiene: Total assistance;Bed level       Functional mobility during ADLs: Maximal assistance;Moderate assistance;+2 for physical  assistance;+2 for safety/equipment General ADL Comments: Pt requesting to go back to bed due to increased pain. Pt required max A to adjust sock, mod A +2 - max A +2 for functional mobility     Vision Baseline Vision/History: Wears glasses       Perception     Praxis      Pertinent Vitals/Pain Pain Assessment: Faces Faces Pain Scale: Hurts whole lot Pain Location: L hip, R hip to knee, lower back Pain Descriptors / Indicators: Aching;Constant;Crying;Discomfort;Grimacing;Guarding;Moaning;Restless;Sore Pain Intervention(s): Limited activity within patient's tolerance;Monitored during session;Patient requesting pain meds-RN notified     Hand Dominance     Extremity/Trunk Assessment Upper Extremity Assessment Upper Extremity Assessment: Overall WFL for tasks assessed   Lower Extremity Assessment Lower Extremity Assessment: Defer to PT evaluation   Cervical / Trunk Assessment Cervical / Trunk Assessment: Normal   Communication Communication Communication: No difficulties   Cognition Arousal/Alertness: Awake/alert Behavior During Therapy: Anxious Overall Cognitive Status: No family/caregiver present to determine baseline cognitive functioning                                 General Comments: Pt appears in significant pain and frustrated. Difficult to assess congition due to frustration and pain levels   General Comments  Pt on 4L O2 with non re-breather. Pt HR in the 110's throughout, SpO2 in 90s throughout, RR between 28-37 throughout, and BP of 94/62 at the beginning of the session.     Exercises     Shoulder Instructions      Home Living Family/patient expects to be discharged to:: Private residence Living Arrangements: Alone Available Help at Discharge: Other (Comment)(Pt reports no one available to help) Type of Home: House Home Access: Stairs to enter [employer] of Steps: 4-5 up the sidewalk, and another 4-5 onto the porch. Entrance  Stairs-Rails: Right Home Layout: One level     Bathroom Shower/Tub: Teacher, early years/pre: Handicapped height Bathroom Accessibility: No   Home Equipment: Environmental consultant - 4 wheels          Prior Functioning/Environment Level of Independence: Independent with assistive device(s)  Gait / Transfers Assistance Needed: Community ambulator with [facility], drives     Comments: Pt reports indepedent with all ADLs and IADLs. Stopped driving ~ 5 wks ago        OT Problem List: Decreased strength;Decreased range of motion;Decreased activity tolerance;Impaired balance (sitting and/or standing);Decreased safety awareness;Decreased knowledge of use of DME or AE;Decreased knowledge of precautions;Pain;Cardiopulmonary status limiting activity      OT Treatment/Interventions: Self-care/ADL training;DME and/or AE instruction;Therapeutic activities;Patient/family education;Balance training    OT Goals(Current goals can be found in the care plan section) Acute Rehab OT Goals Patient Stated Goal: go home OT Goal Formulation: With patient Time For Goal Achievement: 08/08/19 Potential to Achieve Goals: Good ADL Goals Pt Will Perform Grooming: with min guard assist;standing Pt Will Perform Upper Body Dressing: with modified independence;sitting Pt Will Perform Lower Body Dressing: with min assist;sit to/from stand Pt Will Transfer to Toilet: with min assist;stand pivot transfer;bedside commode Pt Will Perform Toileting - Clothing Manipulation and hygiene: with min assist;sit to/from stand Pt Will Perform Tub/Shower Transfer: Tub transfer Additional ADL Goal #1: Pt will demonstrate improved activity tolerance to complete ADL  with min A.  OT Frequency: Min 2X/week   Barriers to D/C:            Co-evaluation              AM-PAC OT "6 Clicks" Daily Activity     Outcome Measure Help from another person eating meals?: None Help from another person taking care of personal grooming?:  None Help from another person toileting, which includes using toliet, bedpan, or urinal?: A Lot Help from another person bathing (including washing, rinsing, drying)?: A Lot Help from another person to put on and taking off regular upper body clothing?: None Help from another person to put on and taking off regular lower body clothing?: A Lot 6 Click Score: 18   End of Session Equipment Utilized During Treatment: Oxygen Nurse Communication: Mobility status;Patient requests pain meds  Activity Tolerance: Patient limited by pain Patient left: with bed alarm set;with call bell/phone within reach;with nursing/sitter in room;in bed  OT Visit Diagnosis: Unsteadiness on feet (R26.81);Other abnormalities of gait and mobility (R26.89);Muscle weakness (generalized) (M62.81);Pain Pain - Right/Left: (both) Pain - part of body: Hip;Leg(lower back)                Time: 4166-0630 OT Time Calculation (min): 8 min Charges:  OT General Charges $OT Visit: 1 Visit OT Evaluation $OT Eval Moderate Complexity: 1 Mod  Craig Owens, OT Student  Craig Owens 07/25/2019, 3:15 PM

## 2019-07-25 NOTE — Progress Notes (Signed)
Inpatient Diabetes Program Recommendations  AACE/ADA: New Consensus Statement on Inpatient Glycemic Control (2015)  Target Ranges:  Prepandial:   less than 140 mg/dL      Peak postprandial:   less than 180 mg/dL (1-2 hours)      Critically ill patients:  140 - 180 mg/dL   Lab Results  Component Value Date   GLUCAP 265 (H) 07/25/2019   HGBA1C 8.1 (H) 06/26/2019    Review of Glycemic Control Results for AXLE, PARFAIT (MRN [id number]) as of 07/25/2019 11:08  Ref. Range 07/24/2019 07:41 07/24/2019 11:36 07/24/2019 17:04 07/24/2019 21:38 07/25/2019 07:58  Glucose-Capillary Latest Ref Range: 70 - 99 mg/dL 129 (H) 333 (H) 276 (H) 225 (H) 265 (H)   Inpatient Diabetes Program Recommendations:   -Consider Increase in Meal coverage to Novolog 8 units tid.  Thank you, Nani Gasser. Scottlyn Mchaney, RN, MSN, CDE  Diabetes Coordinator Inpatient Glycemic Control Team Team Pager [phone] (8am-5pm) 07/25/2019 11:09 AM
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
## 2019-07-26 LAB — BASIC METABOLIC PANEL
Anion gap: 10 (ref 5–15)
BUN: 16 mg/dL (ref 6–20)
CO2: 25 mmol/L (ref 22–32)
Calcium: 9 mg/dL (ref 8.9–10.3)
Chloride: 99 mmol/L (ref 98–111)
Creatinine, Ser: 0.59 mg/dL — ABNORMAL LOW (ref 0.61–1.24)
GFR calc Af Amer: >60 mL/min (ref >60)
GFR calc non Af Amer: >60 mL/min (ref >60)
Glucose, Bld: 153 mg/dL — ABNORMAL HIGH (ref 70–99)
Potassium: 3.7 mmol/L (ref 3.5–5.1)
Sodium: 134 mmol/L — ABNORMAL LOW (ref 135–145)

## 2019-07-26 LAB — CBC
HCT: 30.7 % — ABNORMAL LOW (ref 39.0–52.0)
Hemoglobin: 9.7 g/dL — ABNORMAL LOW (ref 13.0–17.0)
MCH: 26.1 pg (ref 26.0–34.0)
MCHC: 31.6 g/dL (ref 30.0–36.0)
MCV: 82.7 fL (ref 80.0–100.0)
Platelets: 175 10*3/uL (ref 150–400)
RBC: 3.71 MIL/uL — ABNORMAL LOW (ref 4.22–5.81)
RDW: 15.8 % — ABNORMAL HIGH (ref 11.5–15.5)
WBC: 28.7 10*3/uL — ABNORMAL HIGH (ref 4.0–10.5)
nRBC: 0 % (ref 0.0–0.2)

## 2019-07-26 LAB — GLUCOSE, CAPILLARY
Glucose-Capillary: 132 mg/dL — ABNORMAL HIGH (ref 70–99)
Glucose-Capillary: 135 mg/dL — ABNORMAL HIGH (ref 70–99)
Glucose-Capillary: 187 mg/dL — ABNORMAL HIGH (ref 70–99)
Glucose-Capillary: 196 mg/dL — ABNORMAL HIGH (ref 70–99)
Glucose-Capillary: 247 mg/dL — ABNORMAL HIGH (ref 70–99)

## 2019-07-26 NOTE — Progress Notes (Signed)
Physical Therapy Treatment Patient Details Name: Craig Owens MRN: [id number] DOB: [birth year] Today's Date: 07/26/2019    History of Present Illness 58 yo male with history of COPD, tobacco abuse, Non obstructive CAD, THC use remote stroke just recently hospitalized for hypoxic respiratory fialure from 10/13-10/27. Came to ED for worsening SoB and hypoxia. CTA attained and notable for worsening Cavitary lesion in left upper lobe and bilateral acute PE's with evidence of right hearts strain. Admitted 07/15/19 to ICU on HFNC for treatment of hypoxemic respiratory failure secondary to PE and worsening left sided lung mass.     PT Comments    Pt willing to get up but not to get to recliner because it hurt his backside. PT ordered Geomat cushion for next session to get to recliner. Pt was willing to take some steps and then sit in the bed in chair position. Pt is limited in his safe mobility by increased oxygen demand and pain in weightbearing in the presence of weakness L>R LE, and decreased endurance. Pt is supervision for bed mobility, minAx2 for transfers and minAx2 for lateral stepping along EoB 3 feet with RW. D/c plans remain appropriate at this time. PT will continue to follow acutely.    Follow Up Recommendations  SNF     Equipment Recommendations  None recommended by PT    Recommendations for Other Services OT consult     Precautions / Restrictions Precautions Precautions: Fall Restrictions Weight Bearing Restrictions: No    Mobility  Bed Mobility Overal bed mobility: Needs Assistance       Supine to sit: Supervision Sit to supine: Min assist   General bed mobility comments: increased time and effort to come to EoB, with use of hand rail, supervision for safety, minA for returning L LE to bed   Transfers Overall transfer level: Needs assistance Equipment used: Rolling walker (2 wheeled) Transfers: Sit to/from Stand Sit to Stand: Min assist;+2 physical  assistance;From elevated surface         General transfer comment: minAx2 for power up and steadying  Ambulation/Gait Ambulation/Gait assistance: Min assist;+2 physical assistance Gait Distance (Feet): 3 Feet Assistive device: Rolling walker (2 wheeled) Gait Pattern/deviations: Step-to pattern Gait velocity: slowed Gait velocity interpretation: <1.31 ft/sec, indicative of household ambulator General Gait Details: lateral stepping to R up the EoB, minAx 2 for steadying          Balance Overall balance assessment: Needs assistance Sitting-balance support: Feet supported;No upper extremity supported Sitting balance-Leahy Scale: Fair Sitting balance - Comments: seated EoB    Standing balance support: During functional activity;Bilateral upper extremity supported Standing balance-Leahy Scale: Poor Standing balance comment: requires UE support on RW                            Cognition Arousal/Alertness: Awake/alert Behavior During Therapy: WFL for tasks assessed/performed Overall Cognitive Status: Within Functional Limits for tasks assessed                                           General Comments General comments (skin integrity, edema, etc.): Pt on 6L venturi mask FiO2 30%O2, maintained SaO2 >92%O2 throughout session       Pertinent Vitals/Pain Pain Assessment: 0-10 Pain Score: 8  Pain Location: R hip to knee, L lower back, worst in weightbearing Pain Descriptors / Indicators: Burning;Sharp;Shooting Pain Intervention(s):  Limited activity within patient's tolerance;Monitored during session;Repositioned           PT Goals (current goals can now be found in the care plan section) Acute Rehab PT Goals Patient Stated Goal: return home to take care of his animals PT Goal Formulation: With patient Time For Goal Achievement: 08/07/19 Potential to Achieve Goals: Fair Progress towards PT goals: Progressing toward goals    Frequency     Min 3X/week      PT Plan Current plan remains appropriate       AM-PAC PT "6 Clicks" Mobility   Outcome Measure  Help needed turning from your back to your side while in a flat bed without using bedrails?: None Help needed moving from lying on your back to sitting on the side of a flat bed without using bedrails?: A Little Help needed moving to and from a bed to a chair (including a wheelchair)?: A Little Help needed standing up from a chair using your arms (e.g., wheelchair or bedside chair)?: A Little Help needed to walk in hospital room?: A Little Help needed climbing 3-5 steps with a railing? : A Little 6 Click Score: 19    End of Session Equipment Utilized During Treatment: Oxygen Activity Tolerance: Patient limited by pain Patient left: in bed;with call bell/phone within reach Nurse Communication: Mobility status PT Visit Diagnosis: Muscle weakness (generalized) (M62.81);Unsteadiness on feet (R26.81);Other symptoms and signs involving the nervous system (Y17.494)     Time: 4967-5916 PT Time Calculation (min) (ACUTE ONLY): 27 min  Charges:  $Therapeutic Activity: 23-37 mins                     Eustacia Urbanek B. Migdalia Dk PT, DPT Acute Rehabilitation Services Pager [phone] Office [phone]    [place] 07/26/2019, 4:52 PM

## 2019-07-26 NOTE — Progress Notes (Signed)
PROGRESS NOTE    Craig Owens  GUR:[id number] DOB: 1961/09/11 DOA: 07/11/2019 PCP: Lucia Gaskins, MD   Brief Narrative:  Patient is a 58 year old-year-old male with past medical history of hyperperlipidemia, diabetes,COPD, smoker who was initially admitted with shortness of breath on November 1.  He was initially hospitalized from 10/13-10/27 at [facility] hospital for acute pulmonary embolism and cavitary lung lesion concerning for possible pneumonia.  He was discharged on 4 L of oxygen and Eliquis with plan to follow-up as an outpatient for COPD.  On October 31 she presented with worsening dyspnea.  Work-up revealed worsening left-sided cavitary lung mass concerning for malignancy.  He also reported significant unintentional weight loss and hypotensive blood presentation. He was admitted to ICU for acute hypoxic respiratory failure requiring high flow.   He was being cared by [facility] and treated with antibiotics.  Respiratory status has slowly improved ans he is on oxygen at 4 L/min.  Patient transferred to [facility] service on 07/24/2019.  Assessment & Plan:   Active Problems:   Pulmonary embolism (HCC)   Pulmonary embolus (HCC)   Cavitary pneumonia   Hypoxia   Malnutrition of moderate degree   Acute hypoxic respiratory failure: Secondary to cavitary lung lesions, pulmonary infarct secondary to pulmonary embolism, COPD, hospital-acquired weakness.  PCCM following.  Currently on 4 L of oxygen per minute.  Cavitary Pneumonia: CTA showed worsening left upper lobe cavitation 8.6x 6.5x 8.4 cm.  Chest x-ray on 11/10 showed  stable bilateral lung opacities  including large cavitary lesion seen in left upper lobe, most consistent with infection.Left upper lobe cavitary mass suspected to be from pulmonary infarction due to severe pulmonary emboli burden but PNA was also possible. He was treated with antibiotics but again has been started on augmentin as per PCCM.  Blood cultures, fungal serologies, sputum  culture negative.  Covid negative.  Completed steroid course.  Continue bronchodilators. Continue to wean down oxygen.  Right upper lobe spiculated lesion :Imaging showed right upper lobe nodule 2.6x 2.1x 1.8 cm.  This is possible primary bronchogenic carcinoma.  Plan to  investigate more on this after stabilization of his respiratory status.  Submassive PE: Currently on Eliquis.   .Echo showed Left ventricular ejection fraction of  55 to 60%. -Hypoxia persist, continue supplementation  Leukocytosis: Most likely steroid-induced.  Cultures are negative.  Patient is afebrile. Continue to monitor the trend  Steroid-induced hyperglycemia: Continue current insulin regimen  Smoker: Smokes 1 to 2 pack a day.  Counseled for cessation  Debility/deconditioning:Likely has developed ICU acquired weakness. Lives alone.  He he says he does not work anymore because these disabled and walks with the help of cane PT.  PT recommended skilled facility.  Needs extensive physical therapy.SW following. --PTA patient lived alone, unable to care for himself, will need SNF  Social/Ethics--full code, awaiting palliative care consult   Nutrition Problem: Moderate Malnutrition Etiology: chronic illness(lung lesion, possibly cancer)   DVT prophylaxis:Eliquis Code Status: Full Family Communication: I called the daughter on  phone for update Disposition Plan: Not medically ready.  Anticipate slow recovery .He lives alone and says he is disabled.  PT recommended skilled nursing facility  -PTA patient lived alone, unable to care for himself, will need SNF  Consultants: PCCM  Procedures: Echocardiogram  Antimicrobials:  Anti-infectives (From admission, onward)   Start     Dose/Rate Route Frequency Ordered Stop   07/25/19 1400  amoxicillin-clavulanate (AUGMENTIN) 875-125 MG per tablet 1 tablet     1 tablet Oral Every 12  hours 07/25/19 1100     07/25/19 0500  vancomycin (VANCOCIN) IVPB 1000 mg/200 mL premix   Status:  Discontinued     1,000 mg 200 mL/hr over 60 Minutes Intravenous Every 12 hours 07/24/19 1554 07/25/19 1053   07/24/19 1600  ceFEPIme (MAXIPIME) 2 g in sodium chloride 0.9 % 100 mL IVPB  Status:  Discontinued     2 g 200 mL/hr over 30 Minutes Intravenous Every 8 hours 07/24/19 1547 07/25/19 1053   07/24/19 1600  vancomycin (VANCOCIN) 2,000 mg in sodium chloride 0.9 % 500 mL IVPB     2,000 mg 250 mL/hr over 120 Minutes Intravenous  Once 07/24/19 1547 07/24/19 2037   07/21/19 2330  metroNIDAZOLE (FLAGYL) IVPB 500 mg  Status:  Discontinued     500 mg 100 mL/hr over 60 Minutes Intravenous Every 8 hours 07/21/19 2300 07/22/19 0850   07/17/19 1800  vancomycin (VANCOCIN) IVPB 750 mg/150 ml premix  Status:  Discontinued     750 mg 150 mL/hr over 60 Minutes Intravenous Every 8 hours 07/17/19 1627 07/22/19 0850   07/17/19 1800  ceFEPIme (MAXIPIME) 2 g in sodium chloride 0.9 % 100 mL IVPB  Status:  Discontinued     2 g 200 mL/hr over 30 Minutes Intravenous Every 8 hours 07/17/19 1627 07/22/19 0850   07/17/19 0600  doxycycline (VIBRA-TABS) tablet 100 mg  Status:  Discontinued     100 mg Oral Every 12 hours 07/16/19 1505 07/17/19 1614   07/16/19 1800  Ampicillin-Sulbactam (UNASYN) 3 g in sodium chloride 0.9 % 100 mL IVPB  Status:  Discontinued     3 g 200 mL/hr over 30 Minutes Intravenous Every 6 hours 07/16/19 1027 07/16/19 1112   07/16/19 1200  doxycycline (VIBRAMYCIN) 100 mg in sodium chloride 0.9 % 250 mL IVPB  Status:  Discontinued     100 mg 125 mL/hr over 120 Minutes Intravenous Every 12 hours 07/16/19 1112 07/16/19 1505   07/16/19 0000  voriconazole (VFEND) 420 mg in sodium chloride 0.9 % 100 mL IVPB  Status:  Discontinued     4 mg/kg  104.3 kg 71 mL/hr over 120 Minutes Intravenous Every 12 hours 07/13/2019 2229 07/15/19 0956   07/15/19 0600  vancomycin (VANCOCIN) 1,250 mg in sodium chloride 0.9 % 250 mL IVPB  Status:  Discontinued     1,250 mg 166.7 mL/hr over 90 Minutes Intravenous  Every 12 hours 06/25/2019 2116 07/16/19 1021   06/17/2019 2300  vancomycin (VANCOCIN) IVPB 1000 mg/200 mL premix  Status:  Discontinued     1,000 mg 200 mL/hr over 60 Minutes Intravenous Every 8 hours 06/18/2019 1554 07/02/2019 2116   07/10/2019 2230  doxycycline (VIBRAMYCIN) 100 mg in sodium chloride 0.9 % 250 mL IVPB  Status:  Discontinued     100 mg 125 mL/hr over 120 Minutes Intravenous Every 12 hours 06/26/2019 2103 07/16/19 1021   07/08/2019 2230  voriconazole (VFEND) 630 mg in sodium chloride 0.9 % 150 mL IVPB  Status:  Discontinued     6 mg/kg  104.3 kg 106.5 mL/hr over 120 Minutes Intravenous Every 12 hours 07/06/2019 2229 07/15/19 0956   07/08/2019 2200  piperacillin-tazobactam (ZOSYN) IVPB 3.375 g  Status:  Discontinued     3.375 g 12.5 mL/hr over 240 Minutes Intravenous Every 8 hours 07/08/2019 2116 07/16/19 1027   07/04/2019 1900  ceFEPIme (MAXIPIME) 2 g in sodium chloride 0.9 % 100 mL IVPB  Status:  Discontinued     2 g 200 mL/hr over 30 Minutes Intravenous  Every 8 hours 07/04/2019 1542 07/11/2019 2116   06/20/2019 1545  vancomycin (VANCOCIN) IVPB 1000 mg/200 mL premix     1,000 mg 200 mL/hr over 60 Minutes Intravenous Every 1 hr x 2 07/03/2019 1542 07/03/2019 1913   06/30/2019 1530  ceFEPIme (MAXIPIME) 1 g in sodium chloride 0.9 % 100 mL IVPB     1 g 200 mL/hr over 30 Minutes Intravenous  Once 06/27/2019 1517 06/24/2019 1645      Subjective: Complaint complains of fatigue -Cough is not worse  Objective: Vitals:   07/26/19 1528 07/26/19 1600 07/26/19 1700 07/26/19 1800  BP:  (!) 88/74 116/68 122/72  Pulse:  (!) 108 (!) 108 (!) 116  Resp:  (!) 38 (!) 35 (!) 32  Temp: (!) 97.5 F (36.4 C)     TempSrc: Oral     SpO2:  92% 95% 93%  Weight:      Height:        Intake/Output Summary (Last 24 hours) at 07/26/2019 1847 Last data filed at 07/26/2019 1809 Gross per 24 hour  Intake 1400 ml  Output 1535 ml  Net -135 ml   Filed Weights   07/24/19 0500 07/25/19 0500 07/26/19 0500  Weight: 87.4 kg 87.6  kg 87.9 kg    Examination:  General exam: Very deconditioned, debilitated, generalized weakness, appears older than age HEENT:PERRL,Oral mucosa moist, Ear/Nose normal on gross exam Respiratory system: Bilateral diminished breath sounds more on the left Cardiovascular system: S1 & S2 heard, RRR. No JVD,  Gastrointestinal system: Abdomen is nondistended, soft and nontender. Normal bowel sounds heard. Central nervous system: Alert and oriented. No focal neurological deficits.  Generalized weakness Extremities: No edema, no clubbing ,no cyanosis, distal peripheral pulses palpable. Skin: No rashes, lesions or ulcers,no icterus ,no pallor    Data Reviewed:   CBC: Recent Labs  Lab 07/22/19 0358 07/22/19 2214 07/23/19 0323 07/24/19 0127 07/25/19 0425 07/26/19 0409  WBC 28.2*  --  36.8* 34.6* 29.3* 28.7*  HGB 11.5* 11.9* 11.4* 10.7* 10.2* 9.7*  HCT 36.3* 35.0* 36.0* 34.1* 32.6* 30.7*  MCV 83.4  --  84.3 84.0 85.6 82.7  PLT 275  --  220 190 180 161   Basic Metabolic Panel: Recent Labs  Lab 07/22/19 0358 07/22/19 2214 07/23/19 0323 07/24/19 0127 07/25/19 0425 07/26/19 0409  NA 133* 129* 132* 130* 133* 134*  K 4.2 4.7 5.3* 4.2 4.4 3.7  CL 96*  --  96* 96* 99 99  CO2 25  --  23 23 23 25   GLUCOSE 234*  --  148* 136* 133* 153*  BUN 23*  --  29* 23* 21* 16  CREATININE 0.64  --  0.74 0.56* 0.52* 0.59*  CALCIUM 9.3  --  9.3 9.2 9.2 9.0  MG 1.8  --   --  1.9  --   --   PHOS  --   --   --  3.4  --   --    GFR: Estimated Creatinine Clearance: 111 mL/min (A) (by C-G formula based on SCr of 0.59 mg/dL (L)). Liver Function Tests: No results for input(s): AST, ALT, ALKPHOS, BILITOT, PROT, ALBUMIN in the last 168 hours. No results for input(s): LIPASE, AMYLASE in the last 168 hours. No results for input(s): AMMONIA in the last 168 hours. Coagulation Profile: No results for input(s): INR, PROTIME in the last 168 hours. Cardiac Enzymes: No results for input(s): CKTOTAL, CKMB,  CKMBINDEX, TROPONINI in the last 168 hours. BNP (last 3 results) No results for input(s):  PROBNP in the last 8760 hours. HbA1C: No results for input(s): HGBA1C in the last 72 hours. CBG: Recent Labs  Lab 07/25/19 1719 07/25/19 2120 07/26/19 0749 07/26/19 1121 07/26/19 1721  GLUCAP 75 237* 247* 196* 187*   Lipid Profile: No results for input(s): CHOL, HDL, LDLCALC, TRIG, CHOLHDL, LDLDIRECT in the last 72 hours. Thyroid Function Tests: No results for input(s): TSH, T4TOTAL, FREET4, T3FREE, THYROIDAB in the last 72 hours. Anemia Panel: No results for input(s): VITAMINB12, FOLATE, FERRITIN, TIBC, IRON, RETICCTPCT in the last 72 hours. Sepsis Labs: Recent Labs  Lab 07/23/19 0323  PROCALCITON 0.35    No results found for this or any previous visit (from the past 240 hour(s)).   Radiology Studies: No results found.  Scheduled Meds:  amoxicillin-clavulanate  1 tablet Oral Q12H   apixaban  5 mg Oral BID   arformoterol  15 mcg Nebulization BID   budesonide (PULMICORT) nebulizer solution  0.5 mg Nebulization BID   Chlorhexidine Gluconate Cloth  6 each Topical Daily   feeding supplement (ENSURE ENLIVE)  237 mL Oral TID BM   fluticasone  2 spray Each Nare Daily   gabapentin  100 mg Oral TID   guaiFENesin  1,200 mg Oral BID   insulin aspart  0-15 Units Subcutaneous TID WC   insulin aspart  0-5 Units Subcutaneous QHS   insulin aspart  5 Units Subcutaneous TID WC   insulin glargine  45 Units Subcutaneous Daily   ipratropium-albuterol  3 mL Nebulization BID   mouth rinse  15 mL Mouth Rinse BID   multivitamin with minerals  1 tablet Oral Daily   Continuous Infusions:  sodium chloride Stopped (07/24/19 1727)     LOS: 12 days   Roxan Hockey, MD [facility] If 7PM-7AM, please contact night-coverage [email or web address] Password [facility] 07/26/2019, 6:47 PM

## 2019-07-27 ENCOUNTER — Inpatient Hospital Stay (HOSPITAL_COMMUNITY): Payer: Medicare HMO

## 2019-07-27 DIAGNOSIS — I639 Cerebral infarction, unspecified: Secondary | ICD-10-CM

## 2019-07-27 DIAGNOSIS — I6330 Cerebral infarction due to thrombosis of unspecified cerebral artery: Secondary | ICD-10-CM | POA: Diagnosis present

## 2019-07-27 DIAGNOSIS — J9691 Respiratory failure, unspecified with hypoxia: Secondary | ICD-10-CM | POA: Diagnosis present

## 2019-07-27 LAB — LIPID PANEL
Cholesterol: 135 mg/dL (ref 0–200)
HDL: 32 mg/dL — ABNORMAL LOW (ref >40)
LDL Cholesterol: 72 mg/dL (ref 0–99)
Total CHOL/HDL Ratio: 4.2 RATIO
Triglycerides: 155 mg/dL — ABNORMAL HIGH (ref <150)
VLDL: 31 mg/dL (ref 0–40)

## 2019-07-27 LAB — BASIC METABOLIC PANEL
Anion gap: 14 (ref 5–15)
BUN: 15 mg/dL (ref 6–20)
CO2: 23 mmol/L (ref 22–32)
Calcium: 9 mg/dL (ref 8.9–10.3)
Chloride: 96 mmol/L — ABNORMAL LOW (ref 98–111)
Creatinine, Ser: 0.49 mg/dL — ABNORMAL LOW (ref 0.61–1.24)
GFR calc Af Amer: >60 mL/min (ref >60)
GFR calc non Af Amer: >60 mL/min (ref >60)
Glucose, Bld: 138 mg/dL — ABNORMAL HIGH (ref 70–99)
Potassium: 3.7 mmol/L (ref 3.5–5.1)
Sodium: 133 mmol/L — ABNORMAL LOW (ref 135–145)

## 2019-07-27 LAB — APTT: aPTT: 42 seconds — ABNORMAL HIGH (ref 24–36)

## 2019-07-27 LAB — GLUCOSE, CAPILLARY
Glucose-Capillary: 126 mg/dL — ABNORMAL HIGH (ref 70–99)
Glucose-Capillary: 146 mg/dL — ABNORMAL HIGH (ref 70–99)
Glucose-Capillary: 108 mg/dL — ABNORMAL HIGH (ref 70–99)
Glucose-Capillary: 123 mg/dL — ABNORMAL HIGH (ref 70–99)
Glucose-Capillary: 121 mg/dL — ABNORMAL HIGH (ref 70–99)

## 2019-07-27 LAB — CBC
HCT: 31.5 % — ABNORMAL LOW (ref 39.0–52.0)
Hemoglobin: 9.7 g/dL — ABNORMAL LOW (ref 13.0–17.0)
MCH: 25.9 pg — ABNORMAL LOW (ref 26.0–34.0)
MCHC: 30.8 g/dL (ref 30.0–36.0)
MCV: 84.2 fL (ref 80.0–100.0)
Platelets: 177 10*3/uL (ref 150–400)
RBC: 3.74 MIL/uL — ABNORMAL LOW (ref 4.22–5.81)
RDW: 16.2 % — ABNORMAL HIGH (ref 11.5–15.5)
WBC: 27.2 10*3/uL — ABNORMAL HIGH (ref 4.0–10.5)
nRBC: 0 % (ref 0.0–0.2)

## 2019-07-27 LAB — HEMOGLOBIN A1C
Hgb A1c MFr Bld: 8.5 % — ABNORMAL HIGH (ref 4.8–5.6)
Mean Plasma Glucose: 197.25 mg/dL

## 2019-07-27 LAB — HEPARIN LEVEL (UNFRACTIONATED): Heparin Unfractionated: 0.73 IU/mL — ABNORMAL HIGH (ref 0.30–0.70)

## 2019-07-27 MED ORDER — HEPARIN (PORCINE) 25000 UT/250ML-% IV SOLN
1950.0000 [IU]/h | INTRAVENOUS | Status: DC
  Administered 2019-07-27: 1400 [IU]/h via INTRAVENOUS
  Administered 2019-07-28: 1800 [IU]/h via INTRAVENOUS
  Administered 2019-07-28: 1950 [IU]/h via INTRAVENOUS
  Filled 2019-07-27 (×3): qty 250

## 2019-07-27 MED ORDER — STROKE: EARLY STAGES OF RECOVERY BOOK
Freq: Once | Status: DC
  Filled 2019-07-27: qty 1

## 2019-07-27 MED ORDER — IOHEXOL 350 MG/ML SOLN
80.0000 mL | Freq: Once | INTRAVENOUS | Status: AC | PRN
  Administered 2019-07-27: 80 mL via INTRAVENOUS

## 2019-07-27 MED ORDER — SODIUM CHLORIDE 0.9 % IV SOLN
3.0000 g | Freq: Four times a day (QID) | INTRAVENOUS | Status: DC
  Administered 2019-07-27 – 2019-07-28 (×6): 3 g via INTRAVENOUS
  Filled 2019-07-27 (×2): qty 8
  Filled 2019-07-27: qty 3
  Filled 2019-07-27: qty 8
  Filled 2019-07-27: qty 3
  Filled 2019-07-27 (×2): qty 8
  Filled 2019-07-27: qty 3
  Filled 2019-07-27: qty 8
  Filled 2019-07-27: qty 3

## 2019-07-27 MED ORDER — HEPARIN (PORCINE) 25000 UT/250ML-% IV SOLN: 1400.0000 [IU]/h | INTRAVENOUS | Status: DC

## 2019-07-27 NOTE — Progress Notes (Signed)
SLP Cancellation Note  Patient Details Name: Craig Owens MRN: [id number] DOB: Dec 14, 1960   Cancelled assessment:  Pt not sufficiently alert to participate in assessments.  Pt has orders for cognitive/language evaluation.  He failed RN swallow screen Gertie Fey), so will need orders for SLP swallow evaluation.  Spoke with RN. Will f/u next date.  Craig Owens, [place] CCC/SLP Acute Rehabilitation Services Office number [phone] Pager [phone]           Craig Owens 07/27/2019, 3:30 PM

## 2019-07-27 NOTE — Evaluation (Signed)
Physical Therapy Re-Evaluation Patient Details Name: Craig Owens MRN: [id number] DOB: 1961/05/18 Today's Date: 07/27/2019   History of Present Illness  58 yo male with history of COPD, tobacco abuse, Non obstructive CAD, THC use remote stroke just recently hospitalized for hypoxic respiratory fialure from 10/13-10/27. Came to ED for worsening SoB and hypoxia. CTA attained and notable for worsening Cavitary lesion in left upper lobe and bilateral acute PE's with evidence of right hearts strain. Admitted 07/15/19 to ICU on HFNC for treatment of hypoxemic respiratory failure secondary to PE and worsening left sided lung mass. 07/26/19 Code Stroke called onset of L sided weakness, decreased sensation, and facial droop.   Clinical Impression  Code Stroke called last night due to increased L-sided weakness and L facial droop. Pt has also had increased need for supplemental O2 as he is now on 15L via non-rebreather maintaining SaO2 above 92%O2. CT clear of acute abnormality, scheduled for MRI today. Pt with baseline decreased sensation and muscle activation in L LE which has not markedly decreased, however pt has decreased L UE strength and sensation, as well as decreased L sided trunk activation. Pt also experiencing L-sided facial droop and L visual field deficits not fully assessed due to pt lethargy. Pt currently requiring maxAx2 for bed mobility and for transfer sit>stand with RW. D/c plans remain appropriate at this time. PT will continue to follow acutely.     Follow Up Recommendations SNF    Equipment Recommendations  None recommended by PT       Precautions / Restrictions Precautions Precautions: Fall Restrictions Weight Bearing Restrictions: No      Mobility  Bed Mobility Overal bed mobility: Needs Assistance Bed Mobility: Rolling Rolling: Min guard   Supine to sit: Max assist;+2 for physical assistance   Sit to sidelying: Mod assist General bed mobility comments: maxAx2 for  mangement of LE off bed and for trunk to upright, pt able to scoot hips toward EoB,   Transfers Overall transfer level: Needs assistance Equipment used: Rolling walker (2 wheeled) Transfers: Sit to/from Stand Sit to Stand: +2 physical assistance;From elevated surface;Max assist         General transfer comment: maxAx2 for power up into standing, pt with good initiation however decreased initiation of glute activation to come to upright, only able to stand for approximately 15 seconds   Ambulation/Gait             General Gait Details: unable to attempt due to weakness    Modified Rankin (Stroke Patients Only) Modified Rankin (Stroke Patients Only) Pre-Morbid Rankin Score: Moderately severe disability Modified Rankin: Severe disability     Balance Overall balance assessment: Needs assistance Sitting-balance support: Feet supported;No upper extremity supported Sitting balance-Leahy Scale: Poor Sitting balance - Comments: decreased awareness if L sided weakness in maintaining truncal support   Standing balance support: During functional activity;Bilateral upper extremity supported Standing balance-Leahy Scale: Zero                               Pertinent Vitals/Pain Pain Assessment: Faces Faces Pain Scale: Hurts even more Pain Location: R hip to knee, L lower back, worst in weightbearing Pain Descriptors / Indicators: Burning;Sharp;Shooting Pain Intervention(s): Limited activity within patient's tolerance;Monitored during session;Repositioned                Extremity/Trunk Assessment   Upper Extremity Assessment Upper Extremity Assessment: Defer to OT evaluation    Lower Extremity Assessment  RLE Deficits / Details: ROM WFL, strength grossly 3+/5, burning pain from trochanter to knee,  LLE Deficits / Details: AROM limited by weakness in hip, knee and ankle, strength continues to be grossly 2/5  LLE Sensation: history of peripheral  neuropathy;decreased light touch(less sensation than prior to Code Stroke) LLE Coordination: decreased fine motor;decreased gross motor    Cervical / Trunk Assessment Cervical / Trunk Assessment: Other exceptions(decreased L sided truncal muscle activation )  Communication      Cognition Arousal/Alertness: Lethargic Behavior During Therapy: WFL for tasks assessed/performed Overall Cognitive Status: Impaired/Different from baseline Area of Impairment: Problem solving;Awareness;Safety/judgement                         Safety/Judgement: Decreased awareness of safety;Decreased awareness of deficits Awareness: Emergent Problem Solving: Slow processing;Requires verbal cues;Requires tactile cues General Comments: lethargic during reEval, decreased awareness of current deficits as with coming to sitting with increased forward lean to prop on LE, with difficulty stopping forward motion      General Comments General comments (skin integrity, edema, etc.): L facial droop and impaired L sided vision, unable to fully assess due to lethergy Pt on 15L non rebreather with SaO2 100% at rest and high 90s with activity. HR at rest low 110s with max HR 126 bpm with coming to standing, BP 123/70         Assessment/Plan    PT Assessment Patient needs continued PT services  PT Problem List Decreased strength;Decreased activity tolerance;Decreased mobility;Decreased coordination;Cardiopulmonary status limiting activity;Pain       PT Treatment Interventions DME instruction;Gait training;Stair training;Functional mobility training;Therapeutic activities;Therapeutic exercise;Balance training;Cognitive remediation;Patient/family education    PT Goals (Current goals can be found in the Care Plan section)  Acute Rehab PT Goals Patient Stated Goal: return home to take care of his animals PT Goal Formulation: With patient Time For Goal Achievement: 08/07/19 Potential to Achieve Goals: Fair     Frequency Min 3X/week   Barriers to discharge Decreased caregiver support         AM-PAC PT "6 Clicks" Mobility  Outcome Measure Help needed turning from your back to your side while in a flat bed without using bedrails?: None Help needed moving from lying on your back to sitting on the side of a flat bed without using bedrails?: A Little Help needed moving to and from a bed to a chair (including a wheelchair)?: A Little Help needed standing up from a chair using your arms (e.g., wheelchair or bedside chair)?: A Little Help needed to walk in hospital room?: A Little Help needed climbing 3-5 steps with a railing? : A Little 6 Click Score: 19    End of Session Equipment Utilized During Treatment: Oxygen Activity Tolerance: Patient limited by pain Patient left: in bed;with call bell/phone within reach Nurse Communication: Mobility status PT Visit Diagnosis: Muscle weakness (generalized) (M62.81);Unsteadiness on feet (R26.81);Other symptoms and signs involving the nervous system (Q76.226)    Time: 3335-4562 PT Time Calculation (min) (ACUTE ONLY): 22 min   Charges:   PT Evaluation $PT Re-evaluation: 1 Re-eval          Joe Tanney B. Migdalia Dk PT, DPT Acute Rehabilitation Services Pager [phone] Office [phone]   [place] 07/27/2019, 10:17 AM

## 2019-07-27 NOTE — Progress Notes (Signed)
Pt with increased work of breathing with RR in low 30s. MD made aware and order for BIPAP received. Will continue to monitor.

## 2019-07-27 NOTE — Progress Notes (Signed)
[facility] Progress Note Patient Name: Craig Owens DOB: April 30, 1961 MRN: [id number]   Date of Service  07/27/2019  HPI/Events of Note  Pt was called as a code stroke earlier this evening, for left sided weakness. Per neurology he would prefer Pt be switched  to Heparin infusion and Eliquis discontinued due to risk of hemorrhagic conversion, and the difficult reversal of Eliquis if this happens.  eICU Interventions  Eliquis discontinued, pharmacy to dose and monitor Heparin.        Kerry Kass Ogan 07/27/2019, 2:56 AM
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
## 2019-07-27 NOTE — Progress Notes (Signed)
At approximately 0124, this RN entered room to perform patient care. Upon interaction, Pt. found to have significant dysarthria, L sided facial droop, as well as L sided weakness and sensory deficits. A Code Stroke was initiated, patient promptly transported to CT by Rapid Response RN and this RN. CT completed and pt. seen by neurology. Pt. was transported to CT, and returned to assigned room without complication. New orders received and implemented. Vitals stable, pt. resting in bed.

## 2019-07-27 NOTE — Progress Notes (Signed)
PROGRESS NOTE    Craig Owens  WIO:[id number] DOB: [birth year] DOA: 07/11/2019 PCP: Lucia Gaskins, MD   Brief Narrative:  Patient is a 58 year old-year-old male with past medical history of hyperperlipidemia, diabetes,COPD, smoker who was initially admitted with shortness of breath on November 1.  He was initially hospitalized from 10/13-10/27 at [facility] hospital for acute pulmonary embolism and cavitary lung lesion concerning for possible pneumonia.  He was discharged on 4 L of oxygen and Eliquis with plan to follow-up as an outpatient for COPD.  On October 31 she presented with worsening dyspnea.  Work-up revealed worsening left-sided cavitary lung mass concerning for malignancy.  He also reported significant unintentional weight loss and hypotensive blood presentation. He was admitted to ICU for acute hypoxic respiratory failure requiring high flow.   He was being cared by [facility] and treated with antibiotics.  Respiratory status has slowly improved ans he is on oxygen at 4 L/min.  Patient transferred to [facility] service on 07/24/2019. -Code stroke was called on 07/26/2019, imaging studies on 07/27/2019 confirms acute strokes  Assessment & Plan:   Principal Problem:   Cerebral thrombosis with cerebral infarction Active Problems:   Pulmonary embolism (HCC)   Acute Respiratory failure with hypoxia (HCC)   Cavitary pneumonia   Pulmonary embolus (HCC)   Hypoxia   Malnutrition of moderate degree    A/p 1)Acute CVAs--- MRI brain on 07/27/2019 showed Multiple acute infarcts involving several vascular territories suggesting a central source -Neurology input appreciated -New onset left hemiparesis -Concerns about possible paradoxical embolism, patient most likely hypercoagulable in the setting of presumed underlying malignancy -2D Echo EF 55-60%. 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  2)Acute  hypoxic respiratory failure: Secondary to cavitary lung lesions, pulmonary infarct secondary to pulmonary embolism, COPD, hospital-acquired weakness.  PCCM following.   -Hypoxia has worsened, oxygen requirement is increased setting of acute stroke   3)Cavitary Pneumonia: CTA showed worsening left upper lobe cavitation 8.6x 6.5x 8.4 cm.  .Left upper lobe cavitary mass suspected to be from pulmonary infarction due to severe pulmonary emboli burden but PNA was also possible. -.  Blood cultures, fungal serologies, sputum culture negative.  Covid negative.  Completed steroid course.  Continue bronchodilators. -Continue Unasyn  Right upper lobe spiculated lesion :Imaging showed right upper lobe nodule 2.6x 2.1x 1.8 cm.  This is possible primary bronchogenic carcinoma.  Plan to  investigate more on this after stabilization of his respiratory status.  Submassive PE: Initially on Eliquis, given acute stroke on 07/27/2019 switch to IV heparin   .Echo showed Left ventricular ejection fraction of  55 to 60%. -Hypoxia is worse due to acute strokes continue supplementation  Leukocytosis: Most likely steroid-induced.  Cultures are negative.  Patient is afebrile. Continue to monitor the trend  Steroid-induced hyperglycemia: Continue current insulin regimen  Smoker: Smokes 1 to 2 pack a day.  Counseled for cessation  Debility/deconditioning:Likely has developed ICU acquired weakness. Lives alone.  He he says he does not work anymore because these disabled and walks with the help of cane PT.  PT recommended skilled facility.  Needs extensive physical therapy.SW following. --PTA patient lived alone, unable to care for himself, will need SNF  Social/Ethics--full code, awaiting palliative care consult -Patient with acute strokes, pulmonary embolism, lung mass concerning for malignancy--- overall prognosis is poor -Patient states he does not really have close family or friends that can help him make  decisions -Patient's daughter aware of poor prognosis  Nutrition Problem: Moderate Malnutrition Etiology: chronic illness(lung lesion, possibly cancer)   DVT prophylaxis:Eliquis Code Status: Full Family Communication: I called the daughter on  phone for update Disposition Plan: Not medically ready.  Anticipate slow recovery .He lives alone and says he is disabled.  PT recommended skilled nursing facility  - -PTA patient lived alone, unable to care for himself, will need SNF  Consultants: PCCM  Procedures: Echocardiogram  Antimicrobials:  Anti-infectives (From admission, onward)   Start     Dose/Rate Route Frequency Ordered Stop   07/27/19 1130  Ampicillin-Sulbactam (UNASYN) 3 g in sodium chloride 0.9 % 100 mL IVPB     3 g 200 mL/hr over 30 Minutes Intravenous Every 6 hours 07/27/19 1122     07/25/19 1400  amoxicillin-clavulanate (AUGMENTIN) 875-125 MG per tablet 1 tablet  Status:  Discontinued     1 tablet Oral Every 12 hours 07/25/19 1100 07/27/19 1122   07/25/19 0500  vancomycin (VANCOCIN) IVPB 1000 mg/200 mL premix  Status:  Discontinued     1,000 mg 200 mL/hr over 60 Minutes Intravenous Every 12 hours 07/24/19 1554 07/25/19 1053   07/24/19 1600  ceFEPIme (MAXIPIME) 2 g in sodium chloride 0.9 % 100 mL IVPB  Status:  Discontinued     2 g 200 mL/hr over 30 Minutes Intravenous Every 8 hours 07/24/19 1547 07/25/19 1053   07/24/19 1600  vancomycin (VANCOCIN) 2,000 mg in sodium chloride 0.9 % 500 mL IVPB     2,000 mg 250 mL/hr over 120 Minutes Intravenous  Once 07/24/19 1547 07/24/19 2037   07/21/19 2330  metroNIDAZOLE (FLAGYL) IVPB 500 mg  Status:  Discontinued     500 mg 100 mL/hr over 60 Minutes Intravenous Every 8 hours 07/21/19 2300 07/22/19 0850   07/17/19 1800  vancomycin (VANCOCIN) IVPB 750 mg/150 ml premix  Status:  Discontinued     750 mg 150 mL/hr over 60 Minutes Intravenous Every 8 hours 07/17/19 1627 07/22/19 0850   07/17/19 1800  ceFEPIme (MAXIPIME) 2 g in sodium  chloride 0.9 % 100 mL IVPB  Status:  Discontinued     2 g 200 mL/hr over 30 Minutes Intravenous Every 8 hours 07/17/19 1627 07/22/19 0850   07/17/19 0600  doxycycline (VIBRA-TABS) tablet 100 mg  Status:  Discontinued     100 mg Oral Every 12 hours 07/16/19 1505 07/17/19 1614   07/16/19 1800  Ampicillin-Sulbactam (UNASYN) 3 g in sodium chloride 0.9 % 100 mL IVPB  Status:  Discontinued     3 g 200 mL/hr over 30 Minutes Intravenous Every 6 hours 07/16/19 1027 07/16/19 1112   07/16/19 1200  doxycycline (VIBRAMYCIN) 100 mg in sodium chloride 0.9 % 250 mL IVPB  Status:  Discontinued     100 mg 125 mL/hr over 120 Minutes Intravenous Every 12 hours 07/16/19 1112 07/16/19 1505   07/16/19 0000  voriconazole (VFEND) 420 mg in sodium chloride 0.9 % 100 mL IVPB  Status:  Discontinued     4 mg/kg  104.3 kg 71 mL/hr over 120 Minutes Intravenous Every 12 hours 06/23/2019 2229 07/15/19 0956   07/15/19 0600  vancomycin (VANCOCIN) 1,250 mg in sodium chloride 0.9 % 250 mL IVPB  Status:  Discontinued     1,250 mg 166.7 mL/hr over 90 Minutes Intravenous Every 12 hours 06/17/2019 2116 07/16/19 1021   06/22/2019 2300  vancomycin (VANCOCIN) IVPB 1000 mg/200 mL premix  Status:  Discontinued     1,000 mg 200 mL/hr over  60 Minutes Intravenous Every 8 hours 07/04/2019 1554 06/17/2019 2116   07/13/2019 2230  doxycycline (VIBRAMYCIN) 100 mg in sodium chloride 0.9 % 250 mL IVPB  Status:  Discontinued     100 mg 125 mL/hr over 120 Minutes Intravenous Every 12 hours 06/24/2019 2103 07/16/19 1021   07/13/2019 2230  voriconazole (VFEND) 630 mg in sodium chloride 0.9 % 150 mL IVPB  Status:  Discontinued     6 mg/kg  104.3 kg 106.5 mL/hr over 120 Minutes Intravenous Every 12 hours 06/25/2019 2229 07/15/19 0956   06/24/2019 2200  piperacillin-tazobactam (ZOSYN) IVPB 3.375 g  Status:  Discontinued     3.375 g 12.5 mL/hr over 240 Minutes Intravenous Every 8 hours 06/18/2019 2116 07/16/19 1027   06/18/2019 1900  ceFEPIme (MAXIPIME) 2 g in sodium  chloride 0.9 % 100 mL IVPB  Status:  Discontinued     2 g 200 mL/hr over 30 Minutes Intravenous Every 8 hours 07/01/2019 1542 06/19/2019 2116   06/26/2019 1545  vancomycin (VANCOCIN) IVPB 1000 mg/200 mL premix     1,000 mg 200 mL/hr over 60 Minutes Intravenous Every 1 hr x 2 06/20/2019 1542 07/08/2019 1913   06/14/2019 1530  ceFEPIme (MAXIPIME) 1 g in sodium chloride 0.9 % 100 mL IVPB     1 g 200 mL/hr over 30 Minutes Intravenous  Once 07/01/2019 1517 06/26/2019 1645     Subjective: - Shortness of breath, cough, worsening hypoxia, left-sided weakness from acute stroke  Objective: Vitals:   07/27/19 1400 07/27/19 1500 07/27/19 1646 07/27/19 1709  BP: 107/64 113/75 114/66   Pulse: 85 (!) 111 (!) 113 (!) 109  Resp: (!) 26 (!) 28 (!) 24 20  Temp:   97.8 F (36.6 C)   TempSrc:   Oral   SpO2: 98% 99%  95%  Weight:      Height:        Intake/Output Summary (Last 24 hours) at 07/27/2019 1847 Last data filed at 07/27/2019 1741 Gross per 24 hour  Intake 394.72 ml  Output 1240 ml  Net -845.28 ml   Filed Weights   07/25/19 0500 07/26/19 0500 07/27/19 0500  Weight: 87.6 kg 87.9 kg 87.9 kg    Examination:  General exam: Very deconditioned, debilitated, generalized weakness, appears older than age HEENT:PERRL, facial droop  respiratory system: Bilateral diminished breath sounds more on the left Cardiovascular system: S1 & S2 heard, RRR. No JVD,  Gastrointestinal system: Abdomen is nondistended, soft and nontender. Normal bowel sounds heard. Central nervous system: Alert and oriented. Facial droop, left hemiparesis especially left upper extremity Extremities: No edema, no clubbing ,no cyanosis, distal peripheral pulses palpable. Skin: No rashes, lesions or ulcers,no icterus ,no pallor  Data Reviewed:   CBC: Recent Labs  Lab 07/23/19 0323 07/24/19 0127 07/25/19 0425 07/26/19 0409 07/27/19 0615  WBC 36.8* 34.6* 29.3* 28.7* 27.2*  HGB 11.4* 10.7* 10.2* 9.7* 9.7*  HCT 36.0* 34.1* 32.6*  30.7* 31.5*  MCV 84.3 84.0 85.6 82.7 84.2  PLT 220 190 180 175 601   Basic Metabolic Panel: Recent Labs  Lab 07/22/19 0358  07/23/19 0323 07/24/19 0127 07/25/19 0425 07/26/19 0409 07/27/19 0615  NA 133*   < > 132* 130* 133* 134* 133*  K 4.2   < > 5.3* 4.2 4.4 3.7 3.7  CL 96*  --  96* 96* 99 99 96*  CO2 25  --  23 23 23 25 23   GLUCOSE 234*  --  148* 136* 133* 153* 138*  BUN 23*  --  29* 23* 21* 16 15  CREATININE 0.64  --  0.74 0.56* 0.52* 0.59* 0.49*  CALCIUM 9.3  --  9.3 9.2 9.2 9.0 9.0  MG 1.8  --   --  1.9  --   --   --   PHOS  --   --   --  3.4  --   --   --    < > = values in this interval not displayed.   GFR: Estimated Creatinine Clearance: 111 mL/min (A) (by C-G formula based on SCr of 0.49 mg/dL (L)). Liver Function Tests: No results for input(s): AST, ALT, ALKPHOS, BILITOT, PROT, ALBUMIN in the last 168 hours. No results for input(s): LIPASE, AMYLASE in the last 168 hours. No results for input(s): AMMONIA in the last 168 hours. Coagulation Profile: No results for input(s): INR, PROTIME in the last 168 hours. Cardiac Enzymes: No results for input(s): CKTOTAL, CKMB, CKMBINDEX, TROPONINI in the last 168 hours. BNP (last 3 results) No results for input(s): PROBNP in the last 8760 hours. HbA1C: Recent Labs    07/27/19 0615  HGBA1C 8.5*   CBG: Recent Labs  Lab 07/26/19 2336 07/27/19 0331 07/27/19 0751 07/27/19 1152 07/27/19 1649  GLUCAP 135* 126* 146* 108* 123*   Lipid Profile: Recent Labs    07/27/19 0615  CHOL 135  HDL 32*  LDLCALC 72  TRIG 155*  CHOLHDL 4.2   Thyroid Function Tests: No results for input(s): TSH, T4TOTAL, FREET4, T3FREE, THYROIDAB in the last 72 hours. Anemia Panel: No results for input(s): VITAMINB12, FOLATE, FERRITIN, TIBC, IRON, RETICCTPCT in the last 72 hours. Sepsis Labs: Recent Labs  Lab 07/23/19 0323  PROCALCITON 0.35    No results found for this or any previous visit (from the past 240 hour(s)).   Radiology  Studies: Ct Angio Head W Or Wo Contrast  Result Date: 07/27/2019 CLINICAL DATA:  Left-sided weakness. History of colon and bladder cancer. EXAM: CT ANGIOGRAPHY HEAD AND NECK TECHNIQUE: Multidetector CT imaging of the head and neck was performed using the standard protocol during bolus administration of intravenous contrast. Multiplanar CT image reconstructions and MIPs were obtained to evaluate the vascular anatomy. Carotid stenosis measurements (when applicable) are obtained utilizing NASCET criteria, using the distal internal carotid diameter as the denominator. CONTRAST:  11mL OMNIPAQUE IOHEXOL 350 MG/ML SOLN COMPARISON:  Head CT 07/27/2019 FINDINGS: CTA NECK FINDINGS SKELETON: There is a sclerotic lesion in the right posterior T4 vertebral body OTHER NECK: Normal pharynx, larynx and major salivary glands. No cervical lymphadenopathy. Unremarkable thyroid gland. UPPER CHEST: There is a large cavitary lesion of the left upper lobe measuring up to 5.6 cm. There is a large amount of confluent consolidation surrounding the lesion. There are bibasilar opacities at the right lung apex with a nodule measuring 2.1 x 2.5 cm. AORTIC ARCH: There is mild calcific atherosclerosis of the aortic arch. There is no aneurysm, dissection or hemodynamically significant stenosis of the visualized portion of the aorta. Conventional 3 vessel aortic branching pattern. The visualized proximal subclavian arteries are widely patent. RIGHT CAROTID SYSTEM: Normal without aneurysm, dissection or stenosis. LEFT CAROTID SYSTEM: Normal without aneurysm, dissection or stenosis. VERTEBRAL ARTERIES: Right dominant configuration. Both origins are clearly patent. There is no dissection, occlusion or flow-limiting stenosis to the skull base (V1-V3 segments). CTA HEAD FINDINGS POSTERIOR CIRCULATION: --Vertebral arteries: Normal V4 segments. --Posterior inferior cerebellar arteries (PICA): Patent origins from the vertebral arteries. --Anterior  inferior cerebellar arteries (AICA): Patent origins from the basilar artery. --Basilar artery: Normal. --Superior  cerebellar arteries: Normal. --Posterior cerebral arteries: Normal. The left PCA is predominantly supplied by the posterior communicating artery. ANTERIOR CIRCULATION: --Intracranial internal carotid arteries: Normal. --Anterior cerebral arteries (ACA): Normal. Both A1 segments are present. Patent anterior communicating artery (a-comm). --Middle cerebral arteries (MCA): Normal. VENOUS SINUSES: As permitted by contrast timing, patent. ANATOMIC VARIANTS: Fetal origin of the left posterior cerebral artery Review of the MIP images confirms the above findings. IMPRESSION: 1. No emergent large vessel occlusion or high-grade stenosis of the intracranial or cervical arteries. 2. Large cavitary lesion of the left upper lobe with surrounding confluent consolidation. This could indicate necrotizing pneumonia or a partially necrotic neoplasm. 3. Right upper lobe pulmonary nodule measuring 2.1 x 2.5 cm, concerning for neoplasm. 4. Sclerotic lesion in the right posterior T4 vertebral body, concerning for osseous metastatic disease. 5. Aortic Atherosclerosis (ICD10-I70.0). Electronically Signed   By: Ulyses Jarred M.D.   On: 07/27/2019 02:53   Ct Angio Neck W Or Wo Contrast  Result Date: 07/27/2019 CLINICAL DATA:  Left-sided weakness. History of colon and bladder cancer. EXAM: CT ANGIOGRAPHY HEAD AND NECK TECHNIQUE: Multidetector CT imaging of the head and neck was performed using the standard protocol during bolus administration of intravenous contrast. Multiplanar CT image reconstructions and MIPs were obtained to evaluate the vascular anatomy. Carotid stenosis measurements (when applicable) are obtained utilizing NASCET criteria, using the distal internal carotid diameter as the denominator. CONTRAST:  93mL OMNIPAQUE IOHEXOL 350 MG/ML SOLN COMPARISON:  Head CT 07/27/2019 FINDINGS: CTA NECK FINDINGS SKELETON:  There is a sclerotic lesion in the right posterior T4 vertebral body OTHER NECK: Normal pharynx, larynx and major salivary glands. No cervical lymphadenopathy. Unremarkable thyroid gland. UPPER CHEST: There is a large cavitary lesion of the left upper lobe measuring up to 5.6 cm. There is a large amount of confluent consolidation surrounding the lesion. There are bibasilar opacities at the right lung apex with a nodule measuring 2.1 x 2.5 cm. AORTIC ARCH: There is mild calcific atherosclerosis of the aortic arch. There is no aneurysm, dissection or hemodynamically significant stenosis of the visualized portion of the aorta. Conventional 3 vessel aortic branching pattern. The visualized proximal subclavian arteries are widely patent. RIGHT CAROTID SYSTEM: Normal without aneurysm, dissection or stenosis. LEFT CAROTID SYSTEM: Normal without aneurysm, dissection or stenosis. VERTEBRAL ARTERIES: Right dominant configuration. Both origins are clearly patent. There is no dissection, occlusion or flow-limiting stenosis to the skull base (V1-V3 segments). CTA HEAD FINDINGS POSTERIOR CIRCULATION: --Vertebral arteries: Normal V4 segments. --Posterior inferior cerebellar arteries (PICA): Patent origins from the vertebral arteries. --Anterior inferior cerebellar arteries (AICA): Patent origins from the basilar artery. --Basilar artery: Normal. --Superior cerebellar arteries: Normal. --Posterior cerebral arteries: Normal. The left PCA is predominantly supplied by the posterior communicating artery. ANTERIOR CIRCULATION: --Intracranial internal carotid arteries: Normal. --Anterior cerebral arteries (ACA): Normal. Both A1 segments are present. Patent anterior communicating artery (a-comm). --Middle cerebral arteries (MCA): Normal. VENOUS SINUSES: As permitted by contrast timing, patent. ANATOMIC VARIANTS: Fetal origin of the left posterior cerebral artery Review of the MIP images confirms the above findings. IMPRESSION: 1. No  emergent large vessel occlusion or high-grade stenosis of the intracranial or cervical arteries. 2. Large cavitary lesion of the left upper lobe with surrounding confluent consolidation. This could indicate necrotizing pneumonia or a partially necrotic neoplasm. 3. Right upper lobe pulmonary nodule measuring 2.1 x 2.5 cm, concerning for neoplasm. 4. Sclerotic lesion in the right posterior T4 vertebral body, concerning for osseous metastatic disease. 5. Aortic Atherosclerosis (ICD10-I70.0). Electronically Signed  By: Ulyses Jarred M.D.   On: 07/27/2019 02:53   Mr Brain Wo Contrast  Result Date: 07/27/2019 CLINICAL DATA:  Facial weakness EXAM: MRI HEAD WITHOUT CONTRAST TECHNIQUE: Multiplanar, multiecho pulse sequences of the brain and surrounding structures were obtained without intravenous contrast. Axial DWI, axial T2 FLAIR, axial T2 sequences were obtained. Patient could not complete the remainder of the study. COMPARISON:  None. FINDINGS: Brain: There is restricted diffusion involving the right corona radiata as well as patchy cortical and subcortical involvement of bilateral frontoparietal and occipital lobes as well as the cerebellar hemispheres. Chronic left occipital and right cerebellar infarcts are identified. Prominence of the ventricles and sulci reflects parenchymal volume loss. Additional patchy T2 hyperintensity in the supratentorial white matter is nonspecific but may reflect mild chronic microvascular ischemic changes. Vascular: Major vessel flow voids at the skull base are preserved. IMPRESSION: Multiple acute infarcts involving several vascular territories suggesting a central source. 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 Patient could not tolerate the full examination. Electronically Signed   By: Macy Mis M.D.   On: 07/27/2019 13:15   Ct Head Code Stroke Wo Contrast  Result Date: 07/27/2019 CLINICAL DATA:  Code stroke.  Left-sided weakness EXAM: CT HEAD WITHOUT  CONTRAST TECHNIQUE: Contiguous axial images were obtained from the base of the skull through the vertex without intravenous contrast. COMPARISON:  None. FINDINGS: Brain: There are old right cerebellar and left occipital lobe infarcts. No acute hemorrhage or other extra-axial collection. There is hypoattenuation of the periventricular white matter, most commonly indicating chronic ischemic microangiopathy. Vascular: No abnormal hyperdensity of the major intracranial arteries or dural venous sinuses. No intracranial atherosclerosis. Skull: The visualized skull base, calvarium and extracranial soft tissues are normal. Sinuses/Orbits: No fluid levels or advanced mucosal thickening of the visualized paranasal sinuses. No mastoid or middle ear effusion. The orbits are normal. ASPECTS [facility] Stroke Program Early CT Score) - Ganglionic level infarction (caudate, lentiform nuclei, internal capsule, insula, M1-M3 cortex): 7 - Supraganglionic infarction (M4-M6 cortex): 3 Total score (0-10 with 10 being normal): 10 IMPRESSION: 1. Old right cerebellar and left occipital lobe infarcts without acute intracranial abnormality. 2. ASPECTS is 10 These results were communicated to Dr. Roland Rack at 1:57 am on 07/27/2019 by text page via the [facility] messaging system. Electronically Signed   By: Ulyses Jarred M.D.   On: 07/27/2019 01:57    Scheduled Meds:   stroke: mapping our early stages of recovery book   Does not apply Once   arformoterol  15 mcg Nebulization BID   budesonide (PULMICORT) nebulizer solution  0.5 mg Nebulization BID   Chlorhexidine Gluconate Cloth  6 each Topical Daily   feeding supplement (ENSURE ENLIVE)  237 mL Oral TID BM   fluticasone  2 spray Each Nare Daily   gabapentin  100 mg Oral TID   guaiFENesin  1,200 mg Oral BID   insulin aspart  0-15 Units Subcutaneous TID WC   insulin aspart  0-5 Units Subcutaneous QHS   ipratropium-albuterol  3 mL Nebulization BID   mouth rinse  15 mL  Mouth Rinse BID   multivitamin with minerals  1 tablet Oral Daily   Continuous Infusions:  sodium chloride 10 mL/hr at 07/27/19 1500   ampicillin-sulbactam (UNASYN) IV 3 g (07/27/19 1725)   heparin 1,400 Units/hr (07/27/19 1500)    LOS: 13 days   Roxan Hockey, MD [facility] If 7PM-7AM, please contact night-coverage [email or web address] Password [facility] 07/27/2019, 6:47 PM

## 2019-07-27 NOTE — Progress Notes (Signed)
Occupational Therapy Treatment Patient Details Name: Craig Owens MRN: [id number] DOB: 1960/10/07 Today's Date: 07/27/2019    History of present illness 58 yo male with history of COPD, tobacco abuse, Non obstructive CAD, THC use remote stroke just recently hospitalized for hypoxic respiratory fialure from 10/13-10/27. Came to ED for worsening SoB and hypoxia. CTA attained and notable for worsening Cavitary lesion in left upper lobe and bilateral acute PE's with evidence of right hearts strain. Admitted 07/15/19 to ICU on HFNC for treatment of hypoxemic respiratory failure secondary to PE and worsening left sided lung mass. 07/26/19 Code Stroke called onset of L sided weakness, decreased sensation, and facial droop.    OT comments  This 58 yo male admitted with above presents to acute OT today with a decline in function due to new CVA with resultant left sided weakness and vision deficits. Pt needing increased A over initial eval. He will continue to benefit from acute OT with follow up at SNF.  Follow Up Recommendations  Supervision/Assistance - 24 hour;SNF    Equipment Recommendations  Other (comment)(TBD)       Precautions / Restrictions Precautions Precautions: Fall Restrictions Weight Bearing Restrictions: No       Mobility Bed Mobility Overal bed mobility: Needs Assistance Bed Mobility: Rolling Rolling: Min guard   Supine to sit: Max assist;+2 for physical assistance   Sit to sidelying: Mod assist General bed mobility comments: maxAx2 for mangement of LE off bed and for trunk to upright, pt able to scoot hips toward EoB,   Transfers Overall transfer level: Needs assistance Equipment used: Rolling walker (2 wheeled) Transfers: Sit to/from Stand Sit to Stand: +2 physical assistance;From elevated surface;Max assist         General transfer comment: maxAx2 for power up into standing, pt with good initiation however decreased initiation of glute activation to come to  upright, only able to stand for approximately 15 seconds     Balance Overall balance assessment: Needs assistance Sitting-balance support: Feet supported;No upper extremity supported Sitting balance-Leahy Scale: Poor Sitting balance - Comments: decreased awareness if L sided weakness in maintaining truncal support   Standing balance support: During functional activity;Bilateral upper extremity supported Standing balance-Leahy Scale: Zero                             ADL either performed or assessed with clinical judgement   ADL Overall ADL's : Needs assistance/impaired Eating/Feeding: NPO   Grooming: Maximal assistance   Upper Body Bathing: Total assistance;Sitting   Lower Body Bathing: Total assistance Lower Body Bathing Details (indicate cue type and reason): Max A +2 sit<>stand Upper Body Dressing : Total assistance;Sitting   Lower Body Dressing: Total assistance Lower Body Dressing Details (indicate cue type and reason): Max A +2 sit<>stand                     Vision Baseline Vision/History: Wears glasses Wears Glasses: At all times Additional Comments: appears of have a left visual field cut, but with limited eye opening hard to fully say at this point          Cognition Arousal/Alertness: Lethargic Behavior During Therapy: [facility] for tasks assessed/performed Overall Cognitive Status: Impaired/Different from baseline Area of Impairment: Problem solving;Awareness;Safety/judgement                         Safety/Judgement: Decreased awareness of safety;Decreased awareness of deficits Awareness: Emergent Problem  Solving: Slow processing;Requires verbal cues;Requires tactile cues General Comments: lethargic during reEval, decreased awareness of current deficits as with coming to sitting with increased forward lean to prop on LE, with difficulty stopping forward motion                   Pertinent Vitals/ Pain       Pain Assessment:  Faces Faces Pain Scale: Hurts even more Pain Location: R hip to knee, L lower back, worst in weightbearing Pain Descriptors / Indicators: Burning;Sharp;Shooting Pain Intervention(s): Limited activity within patient's tolerance;Monitored during session;Repositioned         Frequency  Min 2X/week        Progress Toward Goals  OT Goals(current goals can now be found in the care plan section)  Progress towards OT goals: Not progressing toward goals - comment(neurological event since eval)  Acute Rehab OT Goals OT Goal Formulation: Patient unable to participate in goal setting Time For Goal Achievement: 08/10/19 Potential to Achieve Goals: [place] Discharge plan remains appropriate       AM-PAC OT "6 Clicks" Daily Activity     Outcome Measure   Help from another person eating meals?: Total Help from another person taking care of personal grooming?: Total Help from another person toileting, which includes using toliet, bedpan, or urinal?: Total Help from another person bathing (including washing, rinsing, drying)?: Total Help from another person to put on and taking off regular upper body clothing?: Total Help from another person to put on and taking off regular lower body clothing?: Total 6 Click Score: 6    End of Session Equipment Utilized During Treatment: Oxygen(non rebreather at 15 liters)  OT Visit Diagnosis: Unsteadiness on feet (R26.81);Other abnormalities of gait and mobility (R26.89);Muscle weakness (generalized) (M62.81);Pain;Hemiplegia and hemiparesis;Low vision, both eyes (H54.2) Hemiplegia - Right/Left: Left Hemiplegia - dominant/non-dominant: Non-Dominant Hemiplegia - caused by: Cerebral infarction Pain - part of body: Hip;Leg(lower back)   Activity Tolerance Patient limited by lethargy   Patient Left in bed;with call bell/phone within reach;with bed alarm set   Nurse Communication Mobility status        Time: 8937-3428 OT Time Calculation (min):  21 min  Charges: OT General Charges $OT Visit: 1 Visit OT Evaluation $OT Re-eval: 1 Re-eval  [place], OTR/L Acute Rehab Services Pager [phone] Office [phone]      Almon Register 07/27/2019, 1:18 PM
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
## 2019-07-27 NOTE — Progress Notes (Signed)
STROKE TEAM PROGRESS NOTE   INTERVAL HISTORY His therapists are at the bedside.  They just got him up and worked with him, he is tired. He has new arm weakness that was not present yesterday.  He has recent bilateral pulmonary emboli and is on anticoagulation presently with heparin.  He appears in respiratory distress despite facemask  Vitals:   07/27/19 0600 07/27/19 0700 07/27/19 0800 07/27/19 0841  BP: 118/62 115/63 116/72   Pulse: (!) 108 (!) 115 (!) 108   Resp: (!) 27 (!) 32 (!) 32   Temp:  98.9 F (37.2 C)    TempSrc:  Axillary    SpO2: 94% 90% 94% 95%  Weight:      Height:        CBC:  Recent Labs  Lab 07/26/19 0409 07/27/19 0615  WBC 28.7* 27.2*  HGB 9.7* 9.7*  HCT 30.7* 31.5*  MCV 82.7 84.2  PLT 175 003    Basic Metabolic Panel:  Recent Labs  Lab 07/22/19 0358  07/24/19 0127  07/26/19 0409 07/27/19 0615  NA 133*   < > 130*   < > 134* 133*  K 4.2   < > 4.2   < > 3.7 3.7  CL 96*   < > 96*   < > 99 96*  CO2 25   < > 23   < > 25 23  GLUCOSE 234*   < > 136*   < > 153* 138*  BUN 23*   < > 23*   < > 16 15  CREATININE 0.64   < > 0.56*   < > 0.59* 0.49*  CALCIUM 9.3   < > 9.2   < > 9.0 9.0  MG 1.8  --  1.9  --   --   --   PHOS  --   --  3.4  --   --   --    < > = values in this interval not displayed.   Lipid Panel:     Component Value Date/Time   CHOL 135 07/27/2019 0615   TRIG 155 (H) 07/27/2019 0615   HDL 32 (L) 07/27/2019 0615   CHOLHDL 4.2 07/27/2019 0615   VLDL 31 07/27/2019 0615   LDLCALC 72 07/27/2019 0615   HgbA1c:  Lab Results  Component Value Date   HGBA1C 8.5 (H) 07/27/2019   Urine Drug Screen:     Component Value Date/Time   LABOPIA NONE DETECTED 06/26/2019 1907   COCAINSCRNUR NONE DETECTED 06/26/2019 1907   LABBENZ NONE DETECTED 06/26/2019 1907   AMPHETMU NONE DETECTED 06/26/2019 1907   THCU NONE DETECTED 06/26/2019 1907   LABBARB NONE DETECTED 06/26/2019 1907    Alcohol Level     Component Value Date/Time   ETH <11 07/22/2013  1039    IMAGING Ct Angio Head W Or Wo Contrast  Result Date: 07/27/2019 CLINICAL DATA:  Left-sided weakness. History of colon and bladder cancer. EXAM: CT ANGIOGRAPHY HEAD AND NECK TECHNIQUE: Multidetector CT imaging of the head and neck was performed using the standard protocol during bolus administration of intravenous contrast. Multiplanar CT image reconstructions and MIPs were obtained to evaluate the vascular anatomy. Carotid stenosis measurements (when applicable) are obtained utilizing NASCET criteria, using the distal internal carotid diameter as the denominator. CONTRAST:  4mL OMNIPAQUE IOHEXOL 350 MG/ML SOLN COMPARISON:  Head CT 07/27/2019 FINDINGS: CTA NECK FINDINGS SKELETON: There is a sclerotic lesion in the right posterior T4 vertebral body OTHER NECK: Normal pharynx, larynx and major salivary  glands. No cervical lymphadenopathy. Unremarkable thyroid gland. UPPER CHEST: There is a large cavitary lesion of the left upper lobe measuring up to 5.6 cm. There is a large amount of confluent consolidation surrounding the lesion. There are bibasilar opacities at the right lung apex with a nodule measuring 2.1 x 2.5 cm. AORTIC ARCH: There is mild calcific atherosclerosis of the aortic arch. There is no aneurysm, dissection or hemodynamically significant stenosis of the visualized portion of the aorta. Conventional 3 vessel aortic branching pattern. The visualized proximal subclavian arteries are widely patent. RIGHT CAROTID SYSTEM: Normal without aneurysm, dissection or stenosis. LEFT CAROTID SYSTEM: Normal without aneurysm, dissection or stenosis. VERTEBRAL ARTERIES: Right dominant configuration. Both origins are clearly patent. There is no dissection, occlusion or flow-limiting stenosis to the skull base (V1-V3 segments). CTA HEAD FINDINGS POSTERIOR CIRCULATION: --Vertebral arteries: Normal V4 segments. --Posterior inferior cerebellar arteries (PICA): Patent origins from the vertebral arteries.  --Anterior inferior cerebellar arteries (AICA): Patent origins from the basilar artery. --Basilar artery: Normal. --Superior cerebellar arteries: Normal. --Posterior cerebral arteries: Normal. The left PCA is predominantly supplied by the posterior communicating artery. ANTERIOR CIRCULATION: --Intracranial internal carotid arteries: Normal. --Anterior cerebral arteries (ACA): Normal. Both A1 segments are present. Patent anterior communicating artery (a-comm). --Middle cerebral arteries (MCA): Normal. VENOUS SINUSES: As permitted by contrast timing, patent. ANATOMIC VARIANTS: Fetal origin of the left posterior cerebral artery Review of the MIP images confirms the above findings. IMPRESSION: 1. No emergent large vessel occlusion or high-grade stenosis of the intracranial or cervical arteries. 2. Large cavitary lesion of the left upper lobe with surrounding confluent consolidation. This could indicate necrotizing pneumonia or a partially necrotic neoplasm. 3. Right upper lobe pulmonary nodule measuring 2.1 x 2.5 cm, concerning for neoplasm. 4. Sclerotic lesion in the right posterior T4 vertebral body, concerning for osseous metastatic disease. 5. Aortic Atherosclerosis (ICD10-I70.0). Electronically Signed   By: Ulyses Jarred M.D.   On: 07/27/2019 02:53   Ct Angio Neck W Or Wo Contrast  Result Date: 07/27/2019 CLINICAL DATA:  Left-sided weakness. History of colon and bladder cancer. EXAM: CT ANGIOGRAPHY HEAD AND NECK TECHNIQUE: Multidetector CT imaging of the head and neck was performed using the standard protocol during bolus administration of intravenous contrast. Multiplanar CT image reconstructions and MIPs were obtained to evaluate the vascular anatomy. Carotid stenosis measurements (when applicable) are obtained utilizing NASCET criteria, using the distal internal carotid diameter as the denominator. CONTRAST:  16mL OMNIPAQUE IOHEXOL 350 MG/ML SOLN COMPARISON:  Head CT 07/27/2019 FINDINGS: CTA NECK FINDINGS  SKELETON: There is a sclerotic lesion in the right posterior T4 vertebral body OTHER NECK: Normal pharynx, larynx and major salivary glands. No cervical lymphadenopathy. Unremarkable thyroid gland. UPPER CHEST: There is a large cavitary lesion of the left upper lobe measuring up to 5.6 cm. There is a large amount of confluent consolidation surrounding the lesion. There are bibasilar opacities at the right lung apex with a nodule measuring 2.1 x 2.5 cm. AORTIC ARCH: There is mild calcific atherosclerosis of the aortic arch. There is no aneurysm, dissection or hemodynamically significant stenosis of the visualized portion of the aorta. Conventional 3 vessel aortic branching pattern. The visualized proximal subclavian arteries are widely patent. RIGHT CAROTID SYSTEM: Normal without aneurysm, dissection or stenosis. LEFT CAROTID SYSTEM: Normal without aneurysm, dissection or stenosis. VERTEBRAL ARTERIES: Right dominant configuration. Both origins are clearly patent. There is no dissection, occlusion or flow-limiting stenosis to the skull base (V1-V3 segments). CTA HEAD FINDINGS POSTERIOR CIRCULATION: --Vertebral arteries: Normal V4 segments. --Posterior  inferior cerebellar arteries (PICA): Patent origins from the vertebral arteries. --Anterior inferior cerebellar arteries (AICA): Patent origins from the basilar artery. --Basilar artery: Normal. --Superior cerebellar arteries: Normal. --Posterior cerebral arteries: Normal. The left PCA is predominantly supplied by the posterior communicating artery. ANTERIOR CIRCULATION: --Intracranial internal carotid arteries: Normal. --Anterior cerebral arteries (ACA): Normal. Both A1 segments are present. Patent anterior communicating artery (a-comm). --Middle cerebral arteries (MCA): Normal. VENOUS SINUSES: As permitted by contrast timing, patent. ANATOMIC VARIANTS: Fetal origin of the left posterior cerebral artery Review of the MIP images confirms the above findings. IMPRESSION:  1. No emergent large vessel occlusion or high-grade stenosis of the intracranial or cervical arteries. 2. Large cavitary lesion of the left upper lobe with surrounding confluent consolidation. This could indicate necrotizing pneumonia or a partially necrotic neoplasm. 3. Right upper lobe pulmonary nodule measuring 2.1 x 2.5 cm, concerning for neoplasm. 4. Sclerotic lesion in the right posterior T4 vertebral body, concerning for osseous metastatic disease. 5. Aortic Atherosclerosis (ICD10-I70.0). Electronically Signed   By: Ulyses Jarred M.D.   On: 07/27/2019 02:53   Ct Head Code Stroke Wo Contrast  Result Date: 07/27/2019 CLINICAL DATA:  Code stroke.  Left-sided weakness EXAM: CT HEAD WITHOUT CONTRAST TECHNIQUE: Contiguous axial images were obtained from the base of the skull through the vertex without intravenous contrast. COMPARISON:  None. FINDINGS: Brain: There are old right cerebellar and left occipital lobe infarcts. No acute hemorrhage or other extra-axial collection. There is hypoattenuation of the periventricular white matter, most commonly indicating chronic ischemic microangiopathy. Vascular: No abnormal hyperdensity of the major intracranial arteries or dural venous sinuses. No intracranial atherosclerosis. Skull: The visualized skull base, calvarium and extracranial soft tissues are normal. Sinuses/Orbits: No fluid levels or advanced mucosal thickening of the visualized paranasal sinuses. No mastoid or middle ear effusion. The orbits are normal. ASPECTS [facility] Stroke Program Early CT Score) - Ganglionic level infarction (caudate, lentiform nuclei, internal capsule, insula, M1-M3 cortex): 7 - Supraganglionic infarction (M4-M6 cortex): 3 Total score (0-10 with 10 being normal): 10 IMPRESSION: 1. Old right cerebellar and left occipital lobe infarcts without acute intracranial abnormality. 2. ASPECTS is 10 These results were communicated to Dr. Roland Rack at 1:57 am on 07/27/2019 by text  page via the [facility] messaging system. Electronically Signed   By: Ulyses Jarred M.D.   On: 07/27/2019 01:57    PHYSICAL EXAM Frail malnourished looking middle-aged Caucasian male in respiratory distress on facemask oxygen.  Is tachycardic. . Afebrile. Head is nontraumatic. Neck is supple without bruit.    Cardiac exam no murmur or gallop. Lungs are clear to auscultation. Distal pulses are well felt. Neurological Exam :   Patient is drowsy but can be aroused.  Is on facemask oxygen and restless in respiratory distress.  His speech is dysarthric but can be understood.  Extraocular movements appear full range.  Blinks to threat bilaterally.  Mild left lower facial weakness.  Tongue midline.  He has mild left hemiparesis 4/5 strength with drift.  Weakness of left grip and intrinsic hand muscles.  He is not very cooperative for detailed muscle testing.  He has good antigravity strength on the right side without focal weakness.  Sensation appears preserved bilaterally.  Gait not tested.  ASSESSMENT/PLAN Mr. Craig Owens is a 58 y.o. male with history of bladder cancer, colon cancer, COPD, tobacco and marijuana use, and previous stroke presenting initially with SOB found to have a cavitary lesion and submassive PE now on Eliquis who developed L sided weakness,  slurred speech as an IP on [facility].  Stroke:   Multiple B infarcts embolic secondary to unknown source, likely hypercoagulable from suspected metastatic lung cancer.  Paradoxical embolism also possibility  Code Stroke CT head No acute abnormality. Old R cerebellar and L occipital infarcts. ASPECTS 10.     CTA head & neck no ELVO. Large cavitary lesion LUL w/ consolidation (?nectrotizing PNA vs neoplasm). RUL nodule (?neoplasm). R posterior T4 body lesion (?mets). Aortic atherosclerosis.   MRI multiple acute infarcts in multiple vascular territories. Old L occipital and R cerebellar infarcts.   Repeat CT in am pending   2D Echo EF 55-60%. No source of  embolus   Consider TCD bubble study to look for PFO  LDL 72  HgbA1c 8.5  IV heparin for VTE prophylaxis  aspirin 81 mg daily and clopidogrel 75 mg daily prior to admission, put on Eliquis for PE and then transitioned to heparin IV once stroke identified and Eliquis level down.    Therapy recommendations:  SNF  Disposition:  pending   Submassive PE   Treated with eliquis  Now on IV heparin  Hypertension  Stable . Permissive hypertension (OK if < 220/120) but gradually normalize in 5-7 days . Long-term BP goal normotensive  Hyperlipidemia  Home meds:  pravachol 40  LDL 72, goal < 70  Resume statin once able to swallow   Continue statin at discharge  Steroid induced hyperglycemia  HgbA1c 8.5, goal < 7.0  Other Stroke Risk Factors  Cigarette smoker, advised to stop smoking  ETOH use,  advised to drink no more than 2 drink(s) a day  Substance abuse - Marijuana. UDS:  THC NONE DETECTED, Cocaine NONE DETECTED. Patient advised to stop using due to stroke risk.  Hx stroke/TIA  2002 - R superior cerebellar, R pontine infarcts. Treated with plavix and statin  Other Active Problems  Acute hypoxic respiratory failure d/t lunc lesions, pulmonary infarct d/t PE, HCAP. CCM on board.  Cavitary PNA LUL. tx w/ abx.  RUL spiculated lesion, possible bronchogenic cancer  Leukocytosis felt to be steroid induced  Debility, deconditioning  Palliative consult pending   Hospital day # 13  I have personally obtained history,examined this patient, reviewed notes, independently viewed imaging studies, participated in medical decision making and plan of care.ROS completed by me personally and pertinent positives fully documented  I have made any additions or clarifications directly to the above note.  Presented with respiratory difficulties and bilateral pulmonary emboli and despite being on anticoagulation developed sudden onset of left-sided weakness and MRI scan shows by  cerebral embolic infarcts the largest one being in the right MCA branch causing left hemiparesis.  Given his diagnosis of lung cancer this may be related to either cancer hypercoagulability, marantic endocarditis or even paradoxical embolism given recent pulmonary embolism.  Agree with continuing IV heparin for anticoagulation until patient is able to swallow.  Repeat CT head in the morning.  May consider checking transcranial Doppler bubble study at the bedside for PFO if patient's respiratory status improves in the next few days.  Discussed with Dr. Joesph Fillers and patient and answered questions. This patient is critically ill and at significant risk of neurological worsening, death and care requires constant monitoring of vital signs, hemodynamics,respiratory and cardiac monitoring, extensive review of multiple databases, frequent neurological assessment, discussion with family, other specialists and medical decision making of high complexity.I have made any additions or clarifications directly to the above note.This critical care time does not reflect procedure time, or  teaching time or supervisory time of PA/NP/Med Resident etc but could involve care discussion time.  I spent 30 minutes of neurocritical care time  in the care of  this patient.     Antony Contras, MD Medical Director [facility] Stroke Center Pager: [phone] 07/27/2019 2:54 PM   To contact Stroke Continuity provider, please refer to [email or web address] After hours, contact General Neurology

## 2019-07-27 NOTE — Progress Notes (Signed)
[facility] for Heparin Indication: PE  Allergies  Allergen Reactions  . Adhesive [Tape] Other (See Comments)    Blisters skin, peels off. Please use paper tape.   . Codeine Hives, Itching and Swelling  . Latex Hives    Patient Measurements: Height: 6\' 1"  (185.4 cm) Weight: 193 lb 12.6 oz (87.9 kg) IBW/kg (Calculated) : 79.9  Vital Signs: Temp: 97.8 F (36.6 C) (11/13 1646) Temp Source: Oral (11/13 1646) BP: 114/66 (11/13 1646) Pulse Rate: 109 (11/13 1709)  Labs: Recent Labs    07/25/19 0425 07/26/19 0409 07/27/19 0615 07/27/19 1755  HGB 10.2* 9.7* 9.7*  --   HCT 32.6* 30.7* 31.5*  --   PLT 180 175 177  --   APTT  --   --   --  42*  HEPARINUNFRC  --   --   --  0.73*  CREATININE 0.52* 0.59* 0.49*  --     Estimated Creatinine Clearance: 111 mL/min (A) (by C-G formula based on SCr of 0.49 mg/dL (L)).  Assessment: 58 y.o. male who was admitted on 10/31 with shortness of breath and found to have cavitary lesion and submassive PE.  He is currently being treated with Eliquis. Now with possible stroke.  Pharmacy consulted for Heparin dosing. Last dose of Eliquis 11/12 2300.   APTT is sub-therapeutic at 42 sec while heparin level is slightly above goal.  Will use aPTT to guide heparin dosing until the levels correlate.  No issue with heparin infusion nor bleeding per RN.  Goal of Therapy:  Heparin level 0.3-0.7 units/ml  APTT 66-102 seconds Monitor platelets by anticoagulation protocol: Yes   Plan:  Increase heparin gtt to 1650 units/hr Check 6 hr aPTT  Hamp Moreland D. Mina Marble, PharmD, BCPS, [place] 07/27/2019, 7:17 PM

## 2019-07-27 NOTE — Code Documentation (Signed)
Responded to Code Stroke called at 0129 for L sided weakness, [id number].  Arrived at bedside at 0133 to assess pt. Pt had L facial droop, L sided weakness/sensory loss, and dysarthria,  NIH-12. Pt taken to CT scan. While in CT scan, pt mentioned that his left side has been feeling weaker for about 24 hours and that he hadn't thought to tell anyone. LSN- now 24 hours ago. CT head-Old right cerebellar and left occipital lobe infarcts without acute intracranial abnormality. CTA-no LVO. No acute intervention. Pt on eliquis,no LVO. Continue to monitor pt, NIH and neuro checks q2h. NPO until pt passes yale stroke swallow screen.

## 2019-07-28 ENCOUNTER — Inpatient Hospital Stay (HOSPITAL_COMMUNITY): Payer: Medicare HMO

## 2019-07-28 DIAGNOSIS — C3490 Malignant neoplasm of unspecified part of unspecified bronchus or lung: Secondary | ICD-10-CM

## 2019-07-28 DIAGNOSIS — Z7189 Other specified counseling: Secondary | ICD-10-CM

## 2019-07-28 DIAGNOSIS — E440 Moderate protein-calorie malnutrition: Secondary | ICD-10-CM

## 2019-07-28 DIAGNOSIS — I6340 Cerebral infarction due to embolism of unspecified cerebral artery: Secondary | ICD-10-CM

## 2019-07-28 DIAGNOSIS — J984 Other disorders of lung: Secondary | ICD-10-CM

## 2019-07-28 LAB — GLUCOSE, CAPILLARY
Glucose-Capillary: 135 mg/dL — ABNORMAL HIGH (ref 70–99)
Glucose-Capillary: 138 mg/dL — ABNORMAL HIGH (ref 70–99)
Glucose-Capillary: 128 mg/dL — ABNORMAL HIGH (ref 70–99)

## 2019-07-28 LAB — CBC
HCT: 31 % — ABNORMAL LOW (ref 39.0–52.0)
Hemoglobin: 9.4 g/dL — ABNORMAL LOW (ref 13.0–17.0)
MCH: 25.8 pg — ABNORMAL LOW (ref 26.0–34.0)
MCHC: 30.3 g/dL (ref 30.0–36.0)
MCV: 85.2 fL (ref 80.0–100.0)
Platelets: 216 10*3/uL (ref 150–400)
RBC: 3.64 MIL/uL — ABNORMAL LOW (ref 4.22–5.81)
RDW: 16.5 % — ABNORMAL HIGH (ref 11.5–15.5)
WBC: 27.9 10*3/uL — ABNORMAL HIGH (ref 4.0–10.5)
nRBC: 0.1 % (ref 0.0–0.2)

## 2019-07-28 LAB — APTT
aPTT: 48 seconds — ABNORMAL HIGH (ref 24–36)
aPTT: 52 seconds — ABNORMAL HIGH (ref 24–36)
aPTT: 53 seconds — ABNORMAL HIGH (ref 24–36)

## 2019-07-28 LAB — RENAL FUNCTION PANEL
Albumin: 1.5 g/dL — ABNORMAL LOW (ref 3.5–5.0)
Anion gap: 14 (ref 5–15)
BUN: 14 mg/dL (ref 6–20)
CO2: 23 mmol/L (ref 22–32)
Calcium: 8.8 mg/dL — ABNORMAL LOW (ref 8.9–10.3)
Chloride: 99 mmol/L (ref 98–111)
Creatinine, Ser: 0.6 mg/dL — ABNORMAL LOW (ref 0.61–1.24)
GFR calc Af Amer: >60 mL/min (ref >60)
GFR calc non Af Amer: >60 mL/min (ref >60)
Glucose, Bld: 136 mg/dL — ABNORMAL HIGH (ref 70–99)
Phosphorus: 3.6 mg/dL (ref 2.5–4.6)
Potassium: 3.6 mmol/L (ref 3.5–5.1)
Sodium: 136 mmol/L (ref 135–145)

## 2019-07-28 LAB — HEPARIN LEVEL (UNFRACTIONATED): Heparin Unfractionated: 0.55 IU/mL (ref 0.30–0.70)

## 2019-07-28 MED ORDER — GUAIFENESIN 100 MG/5ML PO SOLN
15.0000 mL | ORAL | Status: DC
  Administered 2019-07-28 – 2019-07-29 (×2): 300 mg via ORAL
  Filled 2019-07-28 (×2): qty 15

## 2019-07-28 MED ORDER — MORPHINE SULFATE (PF) 2 MG/ML IV SOLN
2.0000 mg | INTRAVENOUS | Status: DC | PRN
  Administered 2019-07-28 – 2019-07-29 (×8): 2 mg via INTRAVENOUS
  Filled 2019-07-28 (×8): qty 1

## 2019-07-28 MED ORDER — MORPHINE SULFATE (PF) 2 MG/ML IV SOLN
1.0000 mg | Freq: Once | INTRAVENOUS | Status: AC
  Administered 2019-07-28: 1 mg via INTRAVENOUS
  Filled 2019-07-28: qty 1

## 2019-07-28 MED ORDER — LORAZEPAM 1 MG PO TABS: 1.0000 mg | ORAL_TABLET | ORAL | Status: DC | PRN

## 2019-07-28 MED ORDER — HALOPERIDOL 0.5 MG PO TABS
0.5000 mg | ORAL_TABLET | ORAL | Status: DC | PRN
  Filled 2019-07-28: qty 1

## 2019-07-28 MED ORDER — MORPHINE SULFATE (CONCENTRATE) 10 MG/0.5ML PO SOLN: 5.0000 mg | ORAL | Status: DC | PRN

## 2019-07-28 MED ORDER — HALOPERIDOL LACTATE 2 MG/ML PO CONC
0.5000 mg | ORAL | Status: DC | PRN
  Filled 2019-07-28: qty 0.3

## 2019-07-28 MED ORDER — LORAZEPAM 2 MG/ML PO CONC: 1.0000 mg | ORAL | Status: DC | PRN

## 2019-07-28 MED ORDER — HALOPERIDOL LACTATE 5 MG/ML IJ SOLN
0.5000 mg | INTRAMUSCULAR | Status: DC | PRN
  Administered 2019-07-28: 0.5 mg via INTRAVENOUS
  Filled 2019-07-28: qty 1

## 2019-07-28 MED ORDER — GABAPENTIN 250 MG/5ML PO SOLN
100.0000 mg | Freq: Three times a day (TID) | ORAL | Status: DC
  Administered 2019-07-28: 100 mg via ORAL
  Filled 2019-07-28 (×7): qty 2

## 2019-07-28 MED ORDER — LORAZEPAM 2 MG/ML IJ SOLN
1.0000 mg | INTRAMUSCULAR | Status: DC | PRN
  Administered 2019-07-28 – 2019-07-30 (×5): 1 mg via INTRAVENOUS
  Filled 2019-07-28 (×5): qty 1

## 2019-07-28 MED ORDER — MORPHINE SULFATE (CONCENTRATE) 10 MG/0.5ML PO SOLN
5.0000 mg | ORAL | Status: DC | PRN
  Administered 2019-07-29: 5 mg via SUBLINGUAL
  Filled 2019-07-28: qty 0.5

## 2019-07-28 MED ORDER — TRAZODONE HCL 50 MG PO TABS: 25.0000 mg | ORAL_TABLET | Freq: Every evening | ORAL | Status: DC | PRN

## 2019-07-28 NOTE — Progress Notes (Signed)
Came to room to check bipap.  Pt was on Rabun, per RN- pt became argumentative over weaning bipap at about 0900 an RN placed pt back on .  VSS currently, no distress noted.
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
## 2019-07-28 NOTE — Progress Notes (Signed)
STROKE TEAM PROGRESS NOTE   INTERVAL HISTORY His speech therapist is at bedside. Pt lying in bed, moderate to severe dysarthria, still has left hemiparesis, arm more then the leg. He is on heparin IV. Palliative care on board. Discussed with Dr. Joesph Fillers, if pt wants continued aggressive care, TEE will be recommended to evaluate PFO and to rule out marantic endocarditis.  Vitals:   07/28/19 0500 07/28/19 0728 07/28/19 0750 07/28/19 0808  BP:  128/75  128/75  Pulse:  (!) 115  (!) 109  Resp:  (!) 23  (!) 25  Temp:  (!) 97.4 F (36.3 C)    TempSrc:  Oral    SpO2:  (!) 85% 94% 100%  Weight: 88.1 kg     Height:        CBC:  Recent Labs  Lab 07/27/19 0615 07/28/19 0219  WBC 27.2* 27.9*  HGB 9.7* 9.4*  HCT 31.5* 31.0*  MCV 84.2 85.2  PLT 177 163    Basic Metabolic Panel:  Recent Labs  Lab 07/22/19 0358  07/24/19 0127  07/27/19 0615 07/28/19 0219  NA 133*   < > 130*   < > 133* 136  K 4.2   < > 4.2   < > 3.7 3.6  CL 96*   < > 96*   < > 96* 99  CO2 25   < > 23   < > 23 23  GLUCOSE 234*   < > 136*   < > 138* 136*  BUN 23*   < > 23*   < > 15 14  CREATININE 0.64   < > 0.56*   < > 0.49* 0.60*  CALCIUM 9.3   < > 9.2   < > 9.0 8.8*  MG 1.8  --  1.9  --   --   --   PHOS  --   --  3.4  --   --  3.6   < > = values in this interval not displayed.   Lipid Panel:     Component Value Date/Time   CHOL 135 07/27/2019 0615   TRIG 155 (H) 07/27/2019 0615   HDL 32 (L) 07/27/2019 0615   CHOLHDL 4.2 07/27/2019 0615   VLDL 31 07/27/2019 0615   LDLCALC 72 07/27/2019 0615   HgbA1c:  Lab Results  Component Value Date   HGBA1C 8.5 (H) 07/27/2019   Urine Drug Screen:     Component Value Date/Time   LABOPIA NONE DETECTED 06/26/2019 1907   COCAINSCRNUR NONE DETECTED 06/26/2019 1907   LABBENZ NONE DETECTED 06/26/2019 1907   AMPHETMU NONE DETECTED 06/26/2019 1907   THCU NONE DETECTED 06/26/2019 1907   LABBARB NONE DETECTED 06/26/2019 1907    Alcohol Level     Component Value  Date/Time   ETH <11 07/22/2013 1039    IMAGING  Ct Angio Head W Or Wo Contrast Ct Angio Neck W Or Wo Contrast 07/27/2019 IMPRESSION:  1. No emergent large vessel occlusion or high-grade stenosis of the intracranial or cervical arteries.  2. Large cavitary lesion of the left upper lobe with surrounding confluent consolidation. This could indicate necrotizing pneumonia or a partially necrotic neoplasm.  3. Right upper lobe pulmonary nodule measuring 2.1 x 2.5 cm, concerning for neoplasm.  4. Sclerotic lesion in the right posterior T4 vertebral body, concerning for osseous metastatic disease.  5. Aortic Atherosclerosis (ICD10-I70.0).   Ct Head Wo Contrast 07/28/2019 IMPRESSION:  1. Expected evolution of right frontal operculum infarct without hemorrhagic conversion or mass effect.  2. Old infarcts of the right cerebellum and left occipital lobe.   Mr Brain Wo Contrast 07/27/2019 IMPRESSION: Multiple acute infarcts involving several vascular territories suggesting a central source. 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 Patient could not tolerate the full examination.   Ct Head Code Stroke Wo Contrast 07/27/2019 IMPRESSION:  1. Old right cerebellar and left occipital lobe infarcts without acute intracranial abnormality.  2. ASPECTS is 10    PHYSICAL EXAM  Temp:  [97.4 F (36.3 C)-98.1 F (36.7 C)] 97.5 F (36.4 C) (11/14 1604) Pulse Rate:  [104-115] 109 (11/14 1132) Resp:  [20-35] 32 (11/14 1132) BP: (114-130)/(66-75) 128/71 (11/14 1604) SpO2:  [85 %-100 %] 94 % (11/14 1132) Weight:  [88.1 kg] 88.1 kg (11/14 0500)  General - Well nourished, well developed, in no apparent distress.  Ophthalmologic - fundi not visualized due to noncooperation.  Cardiovascular - Regular rhythm and tachycardia.  Mental Status -  Level of arousal and orientation to time, and person were intact, stated [place]. Language including expression, naming, repetition,  comprehension was assessed and found intact.  Moderate to severe dysarthria.  Cranial Nerves II - XII - II - Visual field intact OU, however with left simultanagnosia. III, IV, VI - Extraocular movements intact. V - Facial sensation decreased on the left, about 85% of the right. VII - left facial droop VIII - Hearing & vestibular intact bilaterally. X - Palate elevates symmetrically, moderate to severe dysarthria. XI - Chin turning & shoulder shrug intact bilaterally. XII - Tongue protrusion intact.  Motor Strength - The patient's strength was normal in right upper and lower extremities, however, left upper extremity 3+/5 and LLE 4/5 proximal and 4+/5 distally and pronator drift was absent.  Bulk was normal and fasciculations were absent.   Motor Tone - Muscle tone was assessed at the neck and appendages and was normal.  Reflexes - The patient's reflexes were symmetrical in all extremities and he had no pathological reflexes.  Sensory - Light touch, temperature/pinprick were assessed and were decreased on the left, about 85% of the right  Coordination - The patient had normal movements in the right hand with no ataxia or dysmetria.  Tremor was absent.  Gait and Station - deferred.   ASSESSMENT/PLAN Mr. TIMMY BUBECK is a 58 y.o. male with history of bladder cancer, colon cancer, COPD, tobacco and marijuana use, and previous stroke presenting initially with SOB found to have a cavitary lesion and submassive PE now on Eliquis who developed L sided weakness, slurred speech as an IP on [facility].  Stroke:   Multiple B infarcts embolic infarct with largest at right MCA, likely hypercoagulable from suspected metastatic malignancy.  Paradoxical embolism and marantic endocarditis are still in DDx  Code Stroke CT head No acute abnormality. Old R cerebellar and L occipital infarcts. ASPECTS 10.     CTA head & neck no ELVO. Large cavitary lesion LUL w/ consolidation (?nectrotizing PNA vs neoplasm). RUL  nodule (?neoplasm). R posterior T4 body lesion (?mets). Aortic atherosclerosis.   MRI multiple acute infarcts in multiple vascular territories including left cerebellum, bilateral MCA/PCA, bilateral MCA/ACA, right MCA. Old L occipital and R cerebellar infarcts.   Repeat CT - Expected evolution of right frontal operculum infarct. Old infarcts of the right cerebellum and left occipital lobe.  2D Echo EF 55-60%. No source of embolus.  Recommend TEE for PFO evaluation and to rule out marantic endocarditis if decision for aggressive care  LDL 72  HgbA1c 8.5  IV  heparin for VTE prophylaxis  aspirin 81 mg daily and clopidogrel 75 mg daily prior to admission, put on Eliquis for PE and then switched to heparin IV once stroke identified.    Therapy recommendations:  SNF  Disposition:  pending   Submassive PE   Admitted on 10/13-10/27 for PE and pneumonia   treated with eliquis PTA  Tachycardia  Now on IV heparin  Recommend TEE for PFO evaluation if decision for aggressive care  Metastatic lung cancer  CTA head & neck showed large cavitary lesion LUL w/ consolidation (?nectrotizing PNA vs neoplasm). RUL nodule (?neoplasm). R posterior T4 body lesion (?mets).   Possible hypercoagulable state from advanced malignancy  On heparin IV  Palliative care on board, pending GOC discussion  Recommend TEE to rule out marantic endocarditis if decision for aggressive care  Hypertension  Stable . Permissive hypertension (OK if <180/105) but gradually normalize in 5-7 days . Long-term BP goal normotensive  Hyperlipidemia  Home meds:  pravachol 40  LDL 72, goal < 70  Resume statin once p.o. access  Continue statin at discharge  DM  HgbA1c 8.5, goal < 7.0  SSI  CBG monitoring  Tobacco abuse  Current smoker  Smoking cessation counseling will be provided  Other Stroke Risk Factors  ETOH use,  advised to drink no more than 2 drink(s) a day  Hx of substance abuse -  Marijuana.Patient advised to stop using due to stroke risk.  Hx stroke/TIA - 2002 - R superior cerebellar, R pontine infarcts. Treated with plavix and statin  Other Active Problems  Acute hypoxic respiratory failure d/t lung lesions, pulmonary infarct d/t PE, HCAP.   Cavitary PNA LUL. tx w/ abx.  Leukocytosis felt to be steroid induced  Hospital day # 60  Rosalin Hawking, MD PhD Stroke Neurology 07/28/2019 4:32 PM  I spent  35 minutes in total face-to-face time with the patient, more than 50% of which was spent in counseling and coordination of care, reviewing test results, images and medication, and discussing the diagnosis of embolic stroke, metastasized lung cancer, submassive PE, hypercoagulable state, treatment plan and potential prognosis. This patient's care requiresreview of multiple databases, neurological assessment, discussion with family, other specialists and medical decision making of high complexity.I also discussed with Dr. Joesph Fillers over the phone.  To contact Stroke Continuity provider, please refer to [email or web address] After hours, contact General Neurology
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
## 2019-07-28 NOTE — Progress Notes (Signed)
PROGRESS NOTE    Craig Owens  OZH:[id number] DOB: July 19, 1961 DOA: 07/13/2019 PCP: Lucia Gaskins, MD   Brief Narrative:  Patient is a 58 year old-year-old male with past medical history of hyperperlipidemia, diabetes,COPD, smoker who was initially admitted with shortness of breath on November 1.  He was initially hospitalized from 10/13-10/27 at [facility] for acute pulmonary embolism and cavitary lung lesion concerning for possible pneumonia.  He was discharged on 4 L of oxygen and Eliquis with plan to follow-up as an outpatient for COPD.  On October 31 she presented with worsening dyspnea.  Work-up revealed worsening left-sided cavitary lung mass concerning for malignancy.  He also reported significant unintentional weight loss and hypotensive blood presentation. He was admitted to ICU for acute hypoxic respiratory failure requiring high flow.   He was being cared by [facility] and treated with antibiotics.  Respiratory status has slowly improved ans he is on oxygen at 4 L/min.  Patient transferred to [facility] service on 07/24/2019. -Code stroke was called on 07/26/2019, imaging studies on 07/27/2019 confirms acute strokes --SUMMARY OF RECOMMENDATIONS -DNR -Referral to arrange Hospice at home -Transition to comfort- d/c IV heparin, labs, antibiotics -comfort care orders    Assessment & Plan:   Principal Problem:   Cerebral thrombosis with cerebral infarction Active Problems:   Pulmonary embolism (HCC)   Acute Respiratory failure with hypoxia ([place])   Cavitary pneumonia   Pulmonary embolus (HCC)   Hypoxia   Malnutrition of moderate degree   Advanced care planning/counseling discussion   Pulmonary cavitary lesion    A/p 1)Acute CVAs--- MRI brain on 07/27/2019 showed Multiple acute infarcts involving several vascular territories suggesting a central source -Neurology input appreciated -New onset left hemiparesis -Concerns about possible paradoxical embolism, patient most likely  hypercoagulable in the setting of presumed underlying malignancy -2D Echo EF 55-60%. 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 --SUMMARY OF RECOMMENDATIONS -DNR -Referral to arrange Hospice at home -Transition to comfort- d/c IV heparin, labs, antibiotics -comfort care orders    2)Acute hypoxic respiratory failure: Secondary to cavitary lung lesions, pulmonary infarct secondary to pulmonary embolism, COPD, hospital-acquired weakness.  PCCM following.   -Hypoxia has worsened, oxygen requirement is increased setting of acute stroke  -SUMMARY OF RECOMMENDATIONS -DNR -Referral to arrange Hospice at home -Transition to comfort- d/c IV heparin, labs, antibiotics -comfort care orders    3)Cavitary Pneumonia: CTA showed worsening left upper lobe cavitation 8.6x 6.5x 8.4 cm.  .Left upper lobe cavitary mass suspected to be from pulmonary infarction due to severe pulmonary emboli burden but PNA was also possible. -.  Blood cultures, fungal serologies, sputum culture negative.  Covid negative.  Completed steroid course.  -Treated with Unasyn -Patient is now comfort care only  Right upper lobe spiculated lesion :Imaging showed right upper lobe nodule 2.6x 2.1x 1.8 cm.  This is possible primary bronchogenic carcinoma.  -Patient is now comfort care only  Submassive PE: Initially on Eliquis, given acute stroke on 07/27/2019 switch to IV heparin   .Echo showed Left ventricular ejection fraction of  55 to 60%. -Hypoxia is worse due to acute strokes continue supplementation -Anticoagulation discontinued on 07/28/2019 patient is now comfort measures only  Leukocytosis: Most likely steroid-induced.  Cultures are negative.  Patient is afebrile. -Comfort measures only  Steroid-induced hyperglycemia: 0 patient is now comfort measures  Smoker: Smokes 1 to 2 pack a day.    Debility/deconditioning:Likely has  developed  ICU acquired weakness.   He he says he does not work anymore because these disabled and walks with the help of cane PT.  --PTA patient lived alone, unable to care for himself, - -discharge home with home hospice  Social/Ethics--, awaiting palliative care consult appreciated -Patient with acute strokes, pulmonary embolism, lung mass concerning for malignancy--- overall prognosis is poor ---Patient's daughter aware of poor prognosis -SUMMARY OF RECOMMENDATIONS -DNR -Referral to arrange Hospice at home -Transition to comfort- d/c IV heparin, labs, antibiotics - comfort care orders    Nutrition Problem: Moderate Malnutrition Etiology: chronic illness(lung lesion, possibly cancer)   DVT prophylaxis:Eliquis Code Status: Full Family Communication:  daughter   Disposition Plan: -Home with hospice-   Consultants: PCCM/palliative care/neurology  Procedures: Echocardiogram  Antimicrobials:  Anti-infectives (From admission, onward)   Start     Dose/Rate Route Frequency Ordered Stop   07/27/19 1130  Ampicillin-Sulbactam (UNASYN) 3 g in sodium chloride 0.9 % 100 mL IVPB  Status:  Discontinued     3 g 200 mL/hr over 30 Minutes Intravenous Every 6 hours 07/27/19 1122 07/28/19 1811   07/25/19 1400  amoxicillin-clavulanate (AUGMENTIN) 875-125 MG per tablet 1 tablet  Status:  Discontinued     1 tablet Oral Every 12 hours 07/25/19 1100 07/27/19 1122   07/25/19 0500  vancomycin (VANCOCIN) IVPB 1000 mg/200 mL premix  Status:  Discontinued     1,000 mg 200 mL/hr over 60 Minutes Intravenous Every 12 hours 07/24/19 1554 07/25/19 1053   07/24/19 1600  ceFEPIme (MAXIPIME) 2 g in sodium chloride 0.9 % 100 mL IVPB  Status:  Discontinued     2 g 200 mL/hr over 30 Minutes Intravenous Every 8 hours 07/24/19 1547 07/25/19 1053   07/24/19 1600  vancomycin (VANCOCIN) 2,000 mg in sodium chloride 0.9 % 500 mL IVPB     2,000 mg 250 mL/hr over 120 Minutes Intravenous  Once 07/24/19 1547 07/24/19 2037    07/21/19 2330  metroNIDAZOLE (FLAGYL) IVPB 500 mg  Status:  Discontinued     500 mg 100 mL/hr over 60 Minutes Intravenous Every 8 hours 07/21/19 2300 07/22/19 0850   07/17/19 1800  vancomycin (VANCOCIN) IVPB 750 mg/150 ml premix  Status:  Discontinued     750 mg 150 mL/hr over 60 Minutes Intravenous Every 8 hours 07/17/19 1627 07/22/19 0850   07/17/19 1800  ceFEPIme (MAXIPIME) 2 g in sodium chloride 0.9 % 100 mL IVPB  Status:  Discontinued     2 g 200 mL/hr over 30 Minutes Intravenous Every 8 hours 07/17/19 1627 07/22/19 0850   07/17/19 0600  doxycycline (VIBRA-TABS) tablet 100 mg  Status:  Discontinued     100 mg Oral Every 12 hours 07/16/19 1505 07/17/19 1614   07/16/19 1800  Ampicillin-Sulbactam (UNASYN) 3 g in sodium chloride 0.9 % 100 mL IVPB  Status:  Discontinued     3 g 200 mL/hr over 30 Minutes Intravenous Every 6 hours 07/16/19 1027 07/16/19 1112   07/16/19 1200  doxycycline (VIBRAMYCIN) 100 mg in sodium chloride 0.9 % 250 mL IVPB  Status:  Discontinued     100 mg 125 mL/hr over 120 Minutes Intravenous Every 12 hours 07/16/19 1112 07/16/19 1505   07/16/19 0000  voriconazole (VFEND) 420 mg in sodium chloride 0.9 % 100 mL IVPB  Status:  Discontinued     4 mg/kg  104.3 kg 71 mL/hr over 120 Minutes Intravenous Every 12 hours 07/05/2019 2229 07/15/19 0956   07/15/19 0600  vancomycin (VANCOCIN) 1,250 mg in sodium  chloride 0.9 % 250 mL IVPB  Status:  Discontinued     1,250 mg 166.7 mL/hr over 90 Minutes Intravenous Every 12 hours 07/13/2019 2116 07/16/19 1021   07/12/2019 2300  vancomycin (VANCOCIN) IVPB 1000 mg/200 mL premix  Status:  Discontinued     1,000 mg 200 mL/hr over 60 Minutes Intravenous Every 8 hours 07/07/2019 1554 06/27/2019 2116   07/09/2019 2230  doxycycline (VIBRAMYCIN) 100 mg in sodium chloride 0.9 % 250 mL IVPB  Status:  Discontinued     100 mg 125 mL/hr over 120 Minutes Intravenous Every 12 hours 06/28/2019 2103 07/16/19 1021   07/08/2019 2230  voriconazole (VFEND) 630 mg in  sodium chloride 0.9 % 150 mL IVPB  Status:  Discontinued     6 mg/kg  104.3 kg 106.5 mL/hr over 120 Minutes Intravenous Every 12 hours 07/13/2019 2229 07/15/19 0956   06/20/2019 2200  piperacillin-tazobactam (ZOSYN) IVPB 3.375 g  Status:  Discontinued     3.375 g 12.5 mL/hr over 240 Minutes Intravenous Every 8 hours 06/17/2019 2116 07/16/19 1027   06/15/2019 1900  ceFEPIme (MAXIPIME) 2 g in sodium chloride 0.9 % 100 mL IVPB  Status:  Discontinued     2 g 200 mL/hr over 30 Minutes Intravenous Every 8 hours 06/14/2019 1542 06/20/2019 2116   07/13/2019 1545  vancomycin (VANCOCIN) IVPB 1000 mg/200 mL premix     1,000 mg 200 mL/hr over 60 Minutes Intravenous Every 1 hr x 2 07/06/2019 1542 06/30/2019 1913   06/26/2019 1530  ceFEPIme (MAXIPIME) 1 g in sodium chloride 0.9 % 100 mL IVPB     1 g 200 mL/hr over 30 Minutes Intravenous  Once 06/16/2019 1517 06/28/2019 1645     Subjective:  -Resting comfortably  -DNR -Referral to arrange Hospice at home -Transition to comfort- d/c IV heparin, labs, antibiotics comfort care orders    Objective: Vitals:   07/28/19 0800 07/28/19 0808 07/28/19 1132 07/28/19 1604  BP:  128/75 128/69 128/71  Pulse: (!) 104 (!) 109 (!) 109 (!) 111  Resp: (!) 31 (!) 25 (!) 32 (!) 32  Temp:   98.1 F (36.7 C) (!) 97.5 F (36.4 C)  TempSrc:   Oral Oral  SpO2: 99% 100% 94% 94%  Weight:      Height:        Intake/Output Summary (Last 24 hours) at 07/28/2019 1921 Last data filed at 07/28/2019 1709 Gross per 24 hour  Intake 497.37 ml  Output 200 ml  Net 297.37 ml   Filed Weights   07/26/19 0500 07/27/19 0500 07/28/19 0500  Weight: 87.9 kg 87.9 kg 88.1 kg    Examination:  General exam: Very deconditioned, debilitated, generalized weakness, appears older than age HEENT:PERRL, facial droop  respiratory system: Bilateral diminished breath sounds more on the left Cardiovascular system: S1 & S2 heard, RRR. No JVD,  Gastrointestinal system: Abdomen is nondistended, soft and  nontender. Normal bowel sounds heard. Central nervous system: Alert and oriented. Facial droop, left hemiparesis especially left upper extremity Extremities: No edema, no clubbing ,no cyanosis, distal peripheral pulses palpable. Skin: No rashes, lesions or ulcers,no icterus ,no pallor  Data Reviewed:   CBC: Recent Labs  Lab 07/24/19 0127 07/25/19 0425 07/26/19 0409 07/27/19 0615 07/28/19 0219  WBC 34.6* 29.3* 28.7* 27.2* 27.9*  HGB 10.7* 10.2* 9.7* 9.7* 9.4*  HCT 34.1* 32.6* 30.7* 31.5* 31.0*  MCV 84.0 85.6 82.7 84.2 85.2  PLT 190 180 175 177 732   Basic Metabolic Panel: Recent Labs  Lab 07/22/19 0358  07/24/19 0127 07/25/19 0425 07/26/19 0409 07/27/19 0615 07/28/19 0219  NA 133*   < > 130* 133* 134* 133* 136  K 4.2   < > 4.2 4.4 3.7 3.7 3.6  CL 96*   < > 96* 99 99 96* 99  CO2 25   < > 23 23 25 23 23   GLUCOSE 234*   < > 136* 133* 153* 138* 136*  BUN 23*   < > 23* 21* 16 15 14   CREATININE 0.64   < > 0.56* 0.52* 0.59* 0.49* 0.60*  CALCIUM 9.3   < > 9.2 9.2 9.0 9.0 8.8*  MG 1.8  --  1.9  --   --   --   --   PHOS  --   --  3.4  --   --   --  3.6   < > = values in this interval not displayed.   GFR: Estimated Creatinine Clearance: 111 mL/min (A) (by C-G formula based on SCr of 0.6 mg/dL (L)). Liver Function Tests: Recent Labs  Lab 07/28/19 0219  ALBUMIN 1.5*   No results for input(s): LIPASE, AMYLASE in the last 168 hours. No results for input(s): AMMONIA in the last 168 hours. Coagulation Profile: No results for input(s): INR, PROTIME in the last 168 hours. Cardiac Enzymes: No results for input(s): CKTOTAL, CKMB, CKMBINDEX, TROPONINI in the last 168 hours. BNP (last 3 results) No results for input(s): PROBNP in the last 8760 hours. HbA1C: Recent Labs    07/27/19 0615  HGBA1C 8.5*   CBG: Recent Labs  Lab 07/27/19 1649 07/27/19 2141 07/28/19 0627 07/28/19 1129 07/28/19 1609  GLUCAP 123* 121* 135* 138* 128*   Lipid Profile: Recent Labs     07/27/19 0615  CHOL 135  HDL 32*  LDLCALC 72  TRIG 155*  CHOLHDL 4.2   Thyroid Function Tests: No results for input(s): TSH, T4TOTAL, FREET4, T3FREE, THYROIDAB in the last 72 hours. Anemia Panel: No results for input(s): VITAMINB12, FOLATE, FERRITIN, TIBC, IRON, RETICCTPCT in the last 72 hours. Sepsis Labs: Recent Labs  Lab 07/23/19 0323  PROCALCITON 0.35    No results found for this or any previous visit (from the past 240 hour(s)).   Radiology Studies: Ct Angio Head W Or Wo Contrast  Result Date: 07/27/2019 CLINICAL DATA:  Left-sided weakness. History of colon and bladder cancer. EXAM: CT ANGIOGRAPHY HEAD AND NECK TECHNIQUE: Multidetector CT imaging of the head and neck was performed using the standard protocol during bolus administration of intravenous contrast. Multiplanar CT image reconstructions and MIPs were obtained to evaluate the vascular anatomy. Carotid stenosis measurements (when applicable) are obtained utilizing NASCET criteria, using the distal internal carotid diameter as the denominator. CONTRAST:  48mL OMNIPAQUE IOHEXOL 350 MG/ML SOLN COMPARISON:  Head CT 07/27/2019 FINDINGS: CTA NECK FINDINGS SKELETON: There is a sclerotic lesion in the right posterior T4 vertebral body OTHER NECK: Normal pharynx, larynx and major salivary glands. No cervical lymphadenopathy. Unremarkable thyroid gland. UPPER CHEST: There is a large cavitary lesion of the left upper lobe measuring up to 5.6 cm. There is a large amount of confluent consolidation surrounding the lesion. There are bibasilar opacities at the right lung apex with a nodule measuring 2.1 x 2.5 cm. AORTIC ARCH: There is mild calcific atherosclerosis of the aortic arch. There is no aneurysm, dissection or hemodynamically significant stenosis of the visualized portion of the aorta. Conventional 3 vessel aortic branching pattern. The visualized proximal subclavian arteries are widely patent. RIGHT CAROTID SYSTEM: Normal without  aneurysm, dissection or stenosis. LEFT CAROTID SYSTEM: Normal without aneurysm, dissection or stenosis. VERTEBRAL ARTERIES: Right dominant configuration. Both origins are clearly patent. There is no dissection, occlusion or flow-limiting stenosis to the skull base (V1-V3 segments). CTA HEAD FINDINGS POSTERIOR CIRCULATION: --Vertebral arteries: Normal V4 segments. --Posterior inferior cerebellar arteries (PICA): Patent origins from the vertebral arteries. --Anterior inferior cerebellar arteries (AICA): Patent origins from the basilar artery. --Basilar artery: Normal. --Superior cerebellar arteries: Normal. --Posterior cerebral arteries: Normal. The left PCA is predominantly supplied by the posterior communicating artery. ANTERIOR CIRCULATION: --Intracranial internal carotid arteries: Normal. --Anterior cerebral arteries (ACA): Normal. Both A1 segments are present. Patent anterior communicating artery (a-comm). --Middle cerebral arteries (MCA): Normal. VENOUS SINUSES: As permitted by contrast timing, patent. ANATOMIC VARIANTS: Fetal origin of the left posterior cerebral artery Review of the MIP images confirms the above findings. IMPRESSION: 1. No emergent large vessel occlusion or high-grade stenosis of the intracranial or cervical arteries. 2. Large cavitary lesion of the left upper lobe with surrounding confluent consolidation. This could indicate necrotizing pneumonia or a partially necrotic neoplasm. 3. Right upper lobe pulmonary nodule measuring 2.1 x 2.5 cm, concerning for neoplasm. 4. Sclerotic lesion in the right posterior T4 vertebral body, concerning for osseous metastatic disease. 5. Aortic Atherosclerosis (ICD10-I70.0). Electronically Signed   By: Ulyses Jarred M.D.   On: 07/27/2019 02:53   Ct Head Wo Contrast  Result Date: 07/28/2019 CLINICAL DATA:  Stroke follow-up EXAM: CT HEAD WITHOUT CONTRAST TECHNIQUE: Contiguous axial images were obtained from the base of the skull through the vertex without  intravenous contrast. COMPARISON:  07/27/2019 FINDINGS: Brain: There is no mass, hemorrhage or extra-axial collection. There is generalized atrophy without lobar predilection. Hypodensity of the white matter is most commonly associated with chronic microvascular disease. There is hypoattenuation within the right frontal operculum at the site of the known infarct. There are old infarcts of the right cerebellum and left occipital lobe. Vascular: No abnormal hyperdensity of the major intracranial arteries or dural venous sinuses. No intracranial atherosclerosis. Skull: The visualized skull base, calvarium and extracranial soft tissues are normal. Sinuses/Orbits: No fluid levels or advanced mucosal thickening of the visualized paranasal sinuses. No mastoid or middle ear effusion. The orbits are normal. IMPRESSION: 1. Expected evolution of right frontal operculum infarct without hemorrhagic conversion or mass effect. 2. Old infarcts of the right cerebellum and left occipital lobe. Electronically Signed   By: Ulyses Jarred M.D.   On: 07/28/2019 05:19   Ct Angio Neck W Or Wo Contrast  Result Date: 07/27/2019 CLINICAL DATA:  Left-sided weakness. History of colon and bladder cancer. EXAM: CT ANGIOGRAPHY HEAD AND NECK TECHNIQUE: Multidetector CT imaging of the head and neck was performed using the standard protocol during bolus administration of intravenous contrast. Multiplanar CT image reconstructions and MIPs were obtained to evaluate the vascular anatomy. Carotid stenosis measurements (when applicable) are obtained utilizing NASCET criteria, using the distal internal carotid diameter as the denominator. CONTRAST:  36mL OMNIPAQUE IOHEXOL 350 MG/ML SOLN COMPARISON:  Head CT 07/27/2019 FINDINGS: CTA NECK FINDINGS SKELETON: There is a sclerotic lesion in the right posterior T4 vertebral body OTHER NECK: Normal pharynx, larynx and major salivary glands. No cervical lymphadenopathy. Unremarkable thyroid gland. UPPER CHEST:  There is a large cavitary lesion of the left upper lobe measuring up to 5.6 cm. There is a large amount of confluent consolidation surrounding the lesion. There are bibasilar opacities at the right lung apex with a nodule measuring 2.1 x 2.5 cm. AORTIC ARCH: There is  mild calcific atherosclerosis of the aortic arch. There is no aneurysm, dissection or hemodynamically significant stenosis of the visualized portion of the aorta. Conventional 3 vessel aortic branching pattern. The visualized proximal subclavian arteries are widely patent. RIGHT CAROTID SYSTEM: Normal without aneurysm, dissection or stenosis. LEFT CAROTID SYSTEM: Normal without aneurysm, dissection or stenosis. VERTEBRAL ARTERIES: Right dominant configuration. Both origins are clearly patent. There is no dissection, occlusion or flow-limiting stenosis to the skull base (V1-V3 segments). CTA HEAD FINDINGS POSTERIOR CIRCULATION: --Vertebral arteries: Normal V4 segments. --Posterior inferior cerebellar arteries (PICA): Patent origins from the vertebral arteries. --Anterior inferior cerebellar arteries (AICA): Patent origins from the basilar artery. --Basilar artery: Normal. --Superior cerebellar arteries: Normal. --Posterior cerebral arteries: Normal. The left PCA is predominantly supplied by the posterior communicating artery. ANTERIOR CIRCULATION: --Intracranial internal carotid arteries: Normal. --Anterior cerebral arteries (ACA): Normal. Both A1 segments are present. Patent anterior communicating artery (a-comm). --Middle cerebral arteries (MCA): Normal. VENOUS SINUSES: As permitted by contrast timing, patent. ANATOMIC VARIANTS: Fetal origin of the left posterior cerebral artery Review of the MIP images confirms the above findings. IMPRESSION: 1. No emergent large vessel occlusion or high-grade stenosis of the intracranial or cervical arteries. 2. Large cavitary lesion of the left upper lobe with surrounding confluent consolidation. This could indicate  necrotizing pneumonia or a partially necrotic neoplasm. 3. Right upper lobe pulmonary nodule measuring 2.1 x 2.5 cm, concerning for neoplasm. 4. Sclerotic lesion in the right posterior T4 vertebral body, concerning for osseous metastatic disease. 5. Aortic Atherosclerosis (ICD10-I70.0). Electronically Signed   By: Ulyses Jarred M.D.   On: 07/27/2019 02:53   Mr Brain Wo Contrast  Result Date: 07/27/2019 CLINICAL DATA:  Facial weakness EXAM: MRI HEAD WITHOUT CONTRAST TECHNIQUE: Multiplanar, multiecho pulse sequences of the brain and surrounding structures were obtained without intravenous contrast. Axial DWI, axial T2 FLAIR, axial T2 sequences were obtained. Patient could not complete the remainder of the study. COMPARISON:  None. FINDINGS: Brain: There is restricted diffusion involving the right corona radiata as well as patchy cortical and subcortical involvement of bilateral frontoparietal and occipital lobes as well as the cerebellar hemispheres. Chronic left occipital and right cerebellar infarcts are identified. Prominence of the ventricles and sulci reflects parenchymal volume loss. Additional patchy T2 hyperintensity in the supratentorial white matter is nonspecific but may reflect mild chronic microvascular ischemic changes. Vascular: Major vessel flow voids at the skull base are preserved. IMPRESSION: Multiple acute infarcts involving several vascular territories suggesting a central source. 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 Patient could not tolerate the full examination. Electronically Signed   By: Macy Mis M.D.   On: 07/27/2019 13:15   Ct Head Code Stroke Wo Contrast  Result Date: 07/27/2019 CLINICAL DATA:  Code stroke.  Left-sided weakness EXAM: CT HEAD WITHOUT CONTRAST TECHNIQUE: Contiguous axial images were obtained from the base of the skull through the vertex without intravenous contrast. COMPARISON:  None. FINDINGS: Brain: There are old right cerebellar and left  occipital lobe infarcts. No acute hemorrhage or other extra-axial collection. There is hypoattenuation of the periventricular white matter, most commonly indicating chronic ischemic microangiopathy. Vascular: No abnormal hyperdensity of the major intracranial arteries or dural venous sinuses. No intracranial atherosclerosis. Skull: The visualized skull base, calvarium and extracranial soft tissues are normal. Sinuses/Orbits: No fluid levels or advanced mucosal thickening of the visualized paranasal sinuses. No mastoid or middle ear effusion. The orbits are normal. ASPECTS [facility] Stroke Program Early CT Score) - Ganglionic level infarction (caudate, lentiform nuclei, internal capsule, insula,  M1-M3 cortex): 7 - Supraganglionic infarction (M4-M6 cortex): 3 Total score (0-10 with 10 being normal): 10 IMPRESSION: 1. Old right cerebellar and left occipital lobe infarcts without acute intracranial abnormality. 2. ASPECTS is 10 These results were communicated to Dr. Roland Rack at 1:57 am on 07/27/2019 by text page via the [facility] messaging system. Electronically Signed   By: Ulyses Jarred M.D.   On: 07/27/2019 01:57    Scheduled Meds:  arformoterol  15 mcg Nebulization BID   budesonide (PULMICORT) nebulizer solution  0.5 mg Nebulization BID   Chlorhexidine Gluconate Cloth  6 each Topical Daily   feeding supplement (ENSURE ENLIVE)  237 mL Oral TID BM   fluticasone  2 spray Each Nare Daily   gabapentin  100 mg Oral Q8H   guaiFENesin  15 mL Oral Q4H   ipratropium-albuterol  3 mL Nebulization BID   mouth rinse  15 mL Mouth Rinse BID   Continuous Infusions:  SUMMARY OF RECOMMENDATIONS -DNR -Referral to arrange Hospice at home -Transition to comfort- d/c IV heparin, labs, antibiotics  comfort care orders     LOS: 14 days   Roxan Hockey, MD [facility] If 7PM-7AM, please contact night-coverage [email or web address] Password [facility] 07/28/2019, 7:21 PM

## 2019-07-28 NOTE — Progress Notes (Signed)
Received report from night shift RT that pt ripped bipap mask off last night.  Pt agrees to try to wear bipap this morning.  Pt placed on bipap, appears to be tol well currently. Sat 100%, RR 25-30.

## 2019-07-28 NOTE — Progress Notes (Addendum)
ANTICOAGULATION CONSULT NOTE - Follow Up Consult  Pharmacy Consult for heparin Indication: pulmonary embolus and stroke  Labs: Recent Labs    07/26/19 0409 07/27/19 0615 07/27/19 1755 07/28/19 0219  HGB 9.7* 9.7*  --  9.4*  HCT 30.7* 31.5*  --  31.0*  PLT 175 177  --  216  APTT  --   --  42* 48*  HEPARINUNFRC  --   --  0.73* 0.55  CREATININE 0.59* 0.49*  --  0.60*    Assessment: 58yo male remains subtherapeutic on heparin after rate change.  Goal of Therapy:  aPTT 66-85 seconds   Plan:  Will increase heparin gtt by 2 units/kg/hr to 1800 units/hr and check PTT in 6 hours.    Wynona Neat, PharmD, BCPS  07/28/2019,3:27 AM

## 2019-07-29 DIAGNOSIS — Z515 Encounter for palliative care: Secondary | ICD-10-CM

## 2019-07-29 MED ORDER — HALOPERIDOL LACTATE 2 MG/ML PO CONC
0.5000 mg | ORAL | Status: DC | PRN
  Filled 2019-07-29: qty 0.3

## 2019-07-29 MED ORDER — HALOPERIDOL 0.5 MG PO TABS
0.5000 mg | ORAL_TABLET | ORAL | Status: DC | PRN
  Filled 2019-07-29: qty 1

## 2019-07-29 MED ORDER — HALOPERIDOL LACTATE 5 MG/ML IJ SOLN
0.5000 mg | INTRAMUSCULAR | Status: DC | PRN
  Administered 2019-07-30: 0.5 mg via INTRAVENOUS
  Filled 2019-07-29: qty 1

## 2019-07-29 MED ORDER — MORPHINE 100MG IN NS 100ML (1MG/ML) PREMIX INFUSION
3.0000 mg/h | INTRAVENOUS | Status: DC
  Administered 2019-07-29: 3 mg/h via INTRAVENOUS
  Filled 2019-07-29 (×2): qty 100

## 2019-07-29 NOTE — Progress Notes (Addendum)
Pt's daughter Jana Half called twice for preparing hospice at home. Palliative team NP Vonna Kotyk suggested to send patient hospice facility due to his condition. He will contact pt's daughter. She couldn't hear from Palliative team yet. Explained patient there was palliative team note that he couldn't reach to her and palliative team will contact her tomorrow. She understood it. Frequent morphine IVP, po and ativan didn't help him comfortable. Palliative team NP Vonna Kotyk suggested to give morphine drip if frequent medication doesn't help. Paging Palliative team, but no one returned to call back. Notified Dr. Doristine Bosworth regarding morphine drip, He ordered fixed dose of morphine drip, not titrate order. Dr. Doristine Bosworth couldn't change titrate order. Will start morphine drip with fixed dose now. HS [employer]

## 2019-07-29 NOTE — Progress Notes (Signed)
Patient was confused and couldn't understand his speech. Night nurse told that he was pulling out EKG monitor codes. D/C the EKG monitor, patient didn't answer for pain. Continue to monitor and make him comfortable. HS [employer]

## 2019-07-29 NOTE — Progress Notes (Signed)
Attempted to place pt on BIPAP. Pt pulling at wires and at the mask.

## 2019-07-29 NOTE — Progress Notes (Signed)
Follow-up visit made to assess patient's symptoms.  He was transition to comfort care yesterday.  At present, patient appears to be actively uncomfortable.  He is moaning and says that he is having back pain.  He is agitated appearing in the bed.  No family is present.  Discussed with RN.  We will have her give a dose of morphine now.  Would follow-up with dose of lorazepam if needed.  Would have a low threshold for initiation of a morphine drip if necessary to control patient's symptoms.  I tried several times to call patient's daughter but was unsuccessful at reaching her.  I note the plan is for patient to discharge home with hospice.  However, I worry that patient symptoms may be difficult to manage in the home, particularly with oral medications.  Patient would likely be a good candidate for residential hospice.  ROS: Endorses back pain.  Unable to complete full review of systems.  Physical Exam  Constitutional:  Ill-appearing  Respiratory:  Tachypnea  GI: Soft.  Neurological: He is alert.  Oriented x1  Skin: Skin is warm.  Psychiatric:  Agitated    Plan: -Continue comfort care -Discussed use of comfort meds with RN.  Will have her give a dose of morphine now -Would recommend initiation of a morphine drip if frequent dosing of morphine is required to manage his symptoms -Attempted to call patient's daughter but was unable to reach her -Note plan to discharge home with hospice but would consider residential hospice facility if symptoms remain difficult to control  Time Total: 20 minutes  Visit consisted of counseling and education dealing with the complex and emotionally intense issues of symptom management and palliative care in the setting of serious and potentially life-threatening illness.Greater than 50%  of this time was spent counseling and coordinating care related to the above assessment and plan.  Signed by: Altha Harm, PhD, NP-C

## 2019-07-29 NOTE — Plan of Care (Signed)
The patient's chart was reviewed and recent developments discussed with Dr Erlinda Hong. No further role for Neurology at this time. Thank you for allowing [place] to participate in the care of this unfortunate patient. The stroke service will sign off. Please call with any questions or concerns.  Mikey Bussing PA-C [facility] Pager [phone] 07/29/2019, 9:39 AM

## 2019-07-29 NOTE — TOC Progression Note (Addendum)
Transition of Care [facility]) - Progression Note    Patient Details  Name: Craig Owens MRN: [id number] Date of Birth: [birth year]  Transition of Care [facility]) CM/SW Contact  Carles Collet, RN Phone Number: 07/29/2019, 2:59 PM  Clinical Narrative:    Damaris Schooner w patient's daughter Craig Owens to discuss home hospice options. She states that patient will DC to [address] [place] [address] [place] [address] She states that he has a hospital bed already as well as home oxygen through [facility]. She states that she and other family members will be able to provide 24 hour care.  We discussed home hospice providers and she would like to use [facility] with the potential to use [facility] if needed in the future. Call placed to [place], awaiting callback from [facility], for intake assessment. MD updated  Festus Aloe, referral given and faxed to [phone].  Intake will review tomorrow morning for approval. Meds could not be filled until Monday through [facility] (pharmacy used by [facility]). Patient will likely DC Monday, if pain is under control to home. Palliative to discuss residential hospice with daughter if pain does not get under control with additional MSO4 per their note.      Expected Discharge Plan: Home w Hospice Care Barriers to Discharge: Continued Medical Work up  Expected Discharge Plan and Services Expected Discharge Plan: [place] In-house Referral: NA Discharge Planning Services: CM Consult Post Acute Care Choice: [place] arrangements for the past 2 months: Single Family Home                                       Social Determinants of Health (SDOH) Interventions    Readmission Risk Interventions Readmission Risk Prevention Plan 07/19/2019 07/10/2019  Transportation Screening Complete Complete  Medication Review Press photographer) Complete Complete  PCP or Specialist appointment within 3-5  days of discharge Complete Complete  HRI or [place] Complete Complete  SW Recovery Care/Counseling Consult Complete Complete  Palliative Care Screening Not Applicable Not [place] Not Applicable Not Applicable  Some recent data might be hidden

## 2019-07-29 NOTE — Progress Notes (Signed)
PROGRESS NOTE    Craig Owens  RJJ:[id number] DOB: 02-25-1961 DOA: 06/28/2019 PCP: Lucia Gaskins, MD   Brief Narrative:  Patient is a 58 year old-year-old male with past medical history of hyperperlipidemia, diabetes,COPD, smoker who was initially admitted with shortness of breath on November 1.  He was initially hospitalized from 10/13-10/27 at [facility] hospital for acute pulmonary embolism and cavitary lung lesion concerning for possible pneumonia.  He was discharged on 4 L of oxygen and Eliquis with plan to follow-up as an outpatient for COPD.  On October 31 she presented with worsening dyspnea.  Work-up revealed worsening left-sided cavitary lung mass concerning for malignancy.  He also reported significant unintentional weight loss and hypotensive blood presentation. He was admitted to ICU for acute hypoxic respiratory failure requiring high flow.   He was being cared by [facility] and treated with antibiotics.  Respiratory status has slowly improved ans he is on oxygen at 4 L/min.  Patient transferred to [facility] service on 07/24/2019. -Code stroke was called on 07/26/2019, imaging studies on 07/27/2019 confirms acute strokes --SUMMARY OF RECOMMENDATIONS -DNR -Referral to arrange Hospice at home -Transition to comfort- d/c IV heparin, labs, antibiotics -comfort care orders    Assessment & Plan:   Principal Problem:   Cerebral thrombosis with cerebral infarction Active Problems:   Pulmonary embolism (HCC)   Pulmonary embolus ([place])   Cavitary pneumonia   Hypoxia   Malnutrition of moderate degree   Acute Respiratory failure with hypoxia ([place])   Advanced care planning/counseling discussion   Pulmonary cavitary lesion   Palliative care encounter    A/p 1)Acute CVAs--- MRI brain on 07/27/2019 showed Multiple acute infarcts involving several vascular territories suggesting a central source -Neurology input appreciated -New onset left hemiparesis -Concerns about possible paradoxical  embolism, patient most likely hypercoagulable in the setting of presumed underlying malignancy -2D Echo EF 55-60%. 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 --SUMMARY OF RECOMMENDATIONS -DNR -Referral to arrange Hospice at home versus residential hospice if condition continues to decline. -Transition to comfort-on morphine. -comfort care orders placed by palliative care.  2)Acute hypoxic respiratory failure: Secondary to cavitary lung lesions, pulmonary infarct secondary to pulmonary embolism, COPD, hospital-acquired weakness.  PCCM following.   -Hypoxia has worsened, oxygen requirement is increased setting of acute stroke  --SUMMARY OF RECOMMENDATIONS -DNR -Referral to arrange Hospice at home versus residential hospice if condition continues to decline. -Transition to comfort-on morphine. -comfort care orders placed by palliative care.  3)Cavitary Pneumonia: CTA showed worsening left upper lobe cavitation 8.6x 6.5x 8.4 cm.  .Left upper lobe cavitary mass suspected to be from pulmonary infarction due to severe pulmonary emboli burden but PNA was also possible. -.  Blood cultures, fungal serologies, sputum culture negative.  Covid negative.  Completed steroid course.  -Treated with Unasyn -Patient is now comfort care only  Right upper lobe spiculated lesion :Imaging showed right upper lobe nodule 2.6x 2.1x 1.8 cm.  This is possible primary bronchogenic carcinoma.  -Patient is now comfort care only  Submassive PE: Initially on Eliquis, given acute stroke on 07/27/2019 switch to IV heparin   .Echo showed Left ventricular ejection fraction of  55 to 60%. -Hypoxia is worse due to acute strokes continue supplementation -Anticoagulation discontinued on 07/28/2019 patient is now comfort measures only  Leukocytosis: Most likely steroid-induced.  Cultures are negative.  Patient is afebrile. -Comfort  measures only  Steroid-induced hyperglycemia: 0 patient is now  comfort measures  Smoker: Smokes 1 to 2 pack a day.    Debility/deconditioning:Likely has developed ICU acquired weakness.   He he says he does not work anymore because these disabled and walks with the help of cane PT.  --PTA patient lived alone, unable to care for himself, - -discharge home with home hospice  Social/Ethics--, awaiting palliative care consult appreciated -Patient with acute strokes, pulmonary embolism, lung mass concerning for malignancy--- overall prognosis is poor ---Patient's daughter aware of poor prognosis -SUMMARY OF RECOMMENDATIONS -DNR -Referral to arrange Hospice at home -Transition to comfort- d/c IV heparin, labs, antibiotics - comfort care orders    Nutrition Problem: Moderate Malnutrition Etiology: chronic illness(lung lesion, possibly cancer)   DVT prophylaxis:Eliquis Code Status: Full Family Communication: None.  Palliative care team is trying to reach the daughter.  Appreciate their help.  Disposition Plan: -Home with hospice vs residential hospice.   Consultants: PCCM/palliative care/neurology  Procedures: Echocardiogram  Antimicrobials:  Anti-infectives (From admission, onward)   Start     Dose/Rate Route Frequency Ordered Stop   07/27/19 1130  Ampicillin-Sulbactam (UNASYN) 3 g in sodium chloride 0.9 % 100 mL IVPB  Status:  Discontinued     3 g 200 mL/hr over 30 Minutes Intravenous Every 6 hours 07/27/19 1122 07/28/19 1811   07/25/19 1400  amoxicillin-clavulanate (AUGMENTIN) 875-125 MG per tablet 1 tablet  Status:  Discontinued     1 tablet Oral Every 12 hours 07/25/19 1100 07/27/19 1122   07/25/19 0500  vancomycin (VANCOCIN) IVPB 1000 mg/200 mL premix  Status:  Discontinued     1,000 mg 200 mL/hr over 60 Minutes Intravenous Every 12 hours 07/24/19 1554 07/25/19 1053   07/24/19 1600  ceFEPIme (MAXIPIME) 2 g in sodium chloride 0.9 % 100 mL IVPB  Status:  Discontinued     2 g  200 mL/hr over 30 Minutes Intravenous Every 8 hours 07/24/19 1547 07/25/19 1053   07/24/19 1600  vancomycin (VANCOCIN) 2,000 mg in sodium chloride 0.9 % 500 mL IVPB     2,000 mg 250 mL/hr over 120 Minutes Intravenous  Once 07/24/19 1547 07/24/19 2037   07/21/19 2330  metroNIDAZOLE (FLAGYL) IVPB 500 mg  Status:  Discontinued     500 mg 100 mL/hr over 60 Minutes Intravenous Every 8 hours 07/21/19 2300 07/22/19 0850   07/17/19 1800  vancomycin (VANCOCIN) IVPB 750 mg/150 ml premix  Status:  Discontinued     750 mg 150 mL/hr over 60 Minutes Intravenous Every 8 hours 07/17/19 1627 07/22/19 0850   07/17/19 1800  ceFEPIme (MAXIPIME) 2 g in sodium chloride 0.9 % 100 mL IVPB  Status:  Discontinued     2 g 200 mL/hr over 30 Minutes Intravenous Every 8 hours 07/17/19 1627 07/22/19 0850   07/17/19 0600  doxycycline (VIBRA-TABS) tablet 100 mg  Status:  Discontinued     100 mg Oral Every 12 hours 07/16/19 1505 07/17/19 1614   07/16/19 1800  Ampicillin-Sulbactam (UNASYN) 3 g in sodium chloride 0.9 % 100 mL IVPB  Status:  Discontinued     3 g 200 mL/hr over 30 Minutes Intravenous Every 6 hours 07/16/19 1027 07/16/19 1112   07/16/19 1200  doxycycline (VIBRAMYCIN) 100 mg in sodium chloride 0.9 % 250 mL IVPB  Status:  Discontinued     100 mg 125 mL/hr over 120 Minutes Intravenous Every 12 hours 07/16/19 1112 07/16/19 1505   07/16/19 0000  voriconazole (VFEND) 420 mg in sodium chloride 0.9 % 100 mL IVPB  Status:  Discontinued  4 mg/kg  104.3 kg 71 mL/hr over 120 Minutes Intravenous Every 12 hours 07/08/2019 2229 07/15/19 0956   07/15/19 0600  vancomycin (VANCOCIN) 1,250 mg in sodium chloride 0.9 % 250 mL IVPB  Status:  Discontinued     1,250 mg 166.7 mL/hr over 90 Minutes Intravenous Every 12 hours 07/12/2019 2116 07/16/19 1021   06/25/2019 2300  vancomycin (VANCOCIN) IVPB 1000 mg/200 mL premix  Status:  Discontinued     1,000 mg 200 mL/hr over 60 Minutes Intravenous Every 8 hours 06/29/2019 1554 07/11/2019 2116    06/24/2019 2230  doxycycline (VIBRAMYCIN) 100 mg in sodium chloride 0.9 % 250 mL IVPB  Status:  Discontinued     100 mg 125 mL/hr over 120 Minutes Intravenous Every 12 hours 06/22/2019 2103 07/16/19 1021   06/25/2019 2230  voriconazole (VFEND) 630 mg in sodium chloride 0.9 % 150 mL IVPB  Status:  Discontinued     6 mg/kg  104.3 kg 106.5 mL/hr over 120 Minutes Intravenous Every 12 hours 07/13/2019 2229 07/15/19 0956   07/06/2019 2200  piperacillin-tazobactam (ZOSYN) IVPB 3.375 g  Status:  Discontinued     3.375 g 12.5 mL/hr over 240 Minutes Intravenous Every 8 hours 06/30/2019 2116 07/16/19 1027   06/15/2019 1900  ceFEPIme (MAXIPIME) 2 g in sodium chloride 0.9 % 100 mL IVPB  Status:  Discontinued     2 g 200 mL/hr over 30 Minutes Intravenous Every 8 hours 06/15/2019 1542 07/11/2019 2116   07/09/2019 1545  vancomycin (VANCOCIN) IVPB 1000 mg/200 mL premix     1,000 mg 200 mL/hr over 60 Minutes Intravenous Every 1 hr x 2 07/12/2019 1542 07/06/2019 1913   06/21/2019 1530  ceFEPIme (MAXIPIME) 1 g in sodium chloride 0.9 % 100 mL IVPB     1 g 200 mL/hr over 30 Minutes Intravenous  Once 06/26/2019 1517 06/24/2019 1645     Subjective: Patient seen and examined.  He was lethargic and disoriented.  Objective: Vitals:   07/28/19 1132 07/28/19 1604 07/28/19 2020 07/28/19 2157  BP: 128/69 128/71  (!) 146/70  Pulse: (!) 109 (!) 111  (!) 125  Resp: (!) 32 (!) 32  (!) 35  Temp: 98.1 F (36.7 C) (!) 97.5 F (36.4 C)    TempSrc: Oral Oral    SpO2: 94% 94% 94% (!) 87%  Weight:      Height:        Intake/Output Summary (Last 24 hours) at 07/29/2019 1532 Last data filed at 07/29/2019 0408 Gross per 24 hour  Intake 50 ml  Output 600 ml  Net -550 ml   Filed Weights   07/26/19 0500 07/27/19 0500 07/28/19 0500  Weight: 87.9 kg 87.9 kg 88.1 kg    Examination:  General exam: Appears lethargic, deconditioned and disoriented. Respiratory system: Clear to auscultation. Respiratory effort normal. Cardiovascular system: S1  & S2 heard, RRR. No JVD, murmurs, rubs, gallops or clicks. No pedal edema. Gastrointestinal system: Abdomen is nondistended, soft and nontender. No organomegaly or masses felt. Normal bowel sounds heard. Skin: No rashes, lesions or ulcers.  Psychiatry: Unable to assess due to lethargy.  Data Reviewed:   CBC: Recent Labs  Lab 07/24/19 0127 07/25/19 0425 07/26/19 0409 07/27/19 0615 07/28/19 0219  WBC 34.6* 29.3* 28.7* 27.2* 27.9*  HGB 10.7* 10.2* 9.7* 9.7* 9.4*  HCT 34.1* 32.6* 30.7* 31.5* 31.0*  MCV 84.0 85.6 82.7 84.2 85.2  PLT 190 180 175 177 102   Basic Metabolic Panel: Recent Labs  Lab 07/24/19 0127 07/25/19 0425 07/26/19  0923 07/27/19 0615 07/28/19 0219  NA 130* 133* 134* 133* 136  K 4.2 4.4 3.7 3.7 3.6  CL 96* 99 99 96* 99  CO2 23 23 25 23 23   GLUCOSE 136* 133* 153* 138* 136*  BUN 23* 21* 16 15 14   CREATININE 0.56* 0.52* 0.59* 0.49* 0.60*  CALCIUM 9.2 9.2 9.0 9.0 8.8*  MG 1.9  --   --   --   --   PHOS 3.4  --   --   --  3.6   GFR: Estimated Creatinine Clearance: 111 mL/min (A) (by C-G formula based on SCr of 0.6 mg/dL (L)). Liver Function Tests: Recent Labs  Lab 07/28/19 0219  ALBUMIN 1.5*   No results for input(s): LIPASE, AMYLASE in the last 168 hours. No results for input(s): AMMONIA in the last 168 hours. Coagulation Profile: No results for input(s): INR, PROTIME in the last 168 hours. Cardiac Enzymes: No results for input(s): CKTOTAL, CKMB, CKMBINDEX, TROPONINI in the last 168 hours. BNP (last 3 results) No results for input(s): PROBNP in the last 8760 hours. HbA1C: Recent Labs    07/27/19 0615  HGBA1C 8.5*   CBG: Recent Labs  Lab 07/27/19 1649 07/27/19 2141 07/28/19 0627 07/28/19 1129 07/28/19 1609  GLUCAP 123* 121* 135* 138* 128*   Lipid Profile: Recent Labs    07/27/19 0615  CHOL 135  HDL 32*  LDLCALC 72  TRIG 155*  CHOLHDL 4.2   Thyroid Function Tests: No results for input(s): TSH, T4TOTAL, FREET4, T3FREE, THYROIDAB in  the last 72 hours. Anemia Panel: No results for input(s): VITAMINB12, FOLATE, FERRITIN, TIBC, IRON, RETICCTPCT in the last 72 hours. Sepsis Labs: Recent Labs  Lab 07/23/19 0323  PROCALCITON 0.35    No results found for this or any previous visit (from the past 240 hour(s)).   Radiology Studies: Ct Head Wo Contrast  Result Date: 07/28/2019 CLINICAL DATA:  Stroke follow-up EXAM: CT HEAD WITHOUT CONTRAST TECHNIQUE: Contiguous axial images were obtained from the base of the skull through the vertex without intravenous contrast. COMPARISON:  07/27/2019 FINDINGS: Brain: There is no mass, hemorrhage or extra-axial collection. There is generalized atrophy without lobar predilection. Hypodensity of the white matter is most commonly associated with chronic microvascular disease. There is hypoattenuation within the right frontal operculum at the site of the known infarct. There are old infarcts of the right cerebellum and left occipital lobe. Vascular: No abnormal hyperdensity of the major intracranial arteries or dural venous sinuses. No intracranial atherosclerosis. Skull: The visualized skull base, calvarium and extracranial soft tissues are normal. Sinuses/Orbits: No fluid levels or advanced mucosal thickening of the visualized paranasal sinuses. No mastoid or middle ear effusion. The orbits are normal. IMPRESSION: 1. Expected evolution of right frontal operculum infarct without hemorrhagic conversion or mass effect. 2. Old infarcts of the right cerebellum and left occipital lobe. Electronically Signed   By: Ulyses Jarred M.D.   On: 07/28/2019 05:19    Scheduled Meds: . arformoterol  15 mcg Nebulization BID  . budesonide (PULMICORT) nebulizer solution  0.5 mg Nebulization BID  . Chlorhexidine Gluconate Cloth  6 each Topical Daily  . feeding supplement (ENSURE ENLIVE)  237 mL Oral TID BM  . fluticasone  2 spray Each Nare Daily  . gabapentin  100 mg Oral Q8H  . guaiFENesin  15 mL Oral Q4H  .  ipratropium-albuterol  3 mL Nebulization BID  . mouth rinse  15 mL Mouth Rinse BID   Continuous Infusions:  SUMMARY OF RECOMMENDATIONS -DNR -  Referral to arrange Hospice at home -Transition to comfort- d/c IV heparin, labs, antibiotics  comfort care orders     LOS: 15 days   Darliss Cheney, MD [facility] If 7PM-7AM, please contact night-coverage [email or web address] Password [facility] 07/29/2019, 3:32 PM

## 2019-07-30 DIAGNOSIS — K117 Disturbances of salivary secretion: Secondary | ICD-10-CM

## 2019-07-30 DIAGNOSIS — R0600 Dyspnea, unspecified: Secondary | ICD-10-CM

## 2019-07-30 DIAGNOSIS — Z515 Encounter for palliative care: Secondary | ICD-10-CM

## 2019-07-30 MED ORDER — MORPHINE BOLUS VIA INFUSION
1.0000 mg | INTRAVENOUS | Status: DC | PRN
  Administered 2019-07-30 (×3): 1 mg via INTRAVENOUS
  Filled 2019-07-30: qty 1

## 2019-07-30 MED ORDER — LORAZEPAM 2 MG/ML PO CONC: 1.0000 mg | ORAL | Status: DC | PRN

## 2019-07-30 MED ORDER — LORAZEPAM 1 MG PO TABS: 1.0000 mg | ORAL_TABLET | ORAL | Status: DC | PRN

## 2019-07-30 MED ORDER — GLYCOPYRROLATE 0.2 MG/ML IJ SOLN
0.4000 mg | Freq: Four times a day (QID) | INTRAMUSCULAR | Status: DC
  Administered 2019-07-30: 10:00:00 0.4 mg via INTRAVENOUS
  Filled 2019-07-30: qty 2

## 2019-07-30 MED ORDER — LORAZEPAM 2 MG/ML IJ SOLN
2.0000 mg | INTRAMUSCULAR | Status: DC | PRN
  Administered 2019-07-30: 2 mg via INTRAVENOUS
  Filled 2019-07-30: qty 1

## 2019-07-31 LAB — GLUCOSE, CAPILLARY: Glucose-Capillary: 209 mg/dL — ABNORMAL HIGH (ref 70–99)

## 2019-08-14 NOTE — Progress Notes (Signed)
Witnessed by Silvestre Mesi RN wasting 4.5 mg Haldol.  0.5 was given to patient.  Unable to pull up medication in the pyxis after patient discharged.  Wasted in Chief Operating Officer

## 2019-08-14 NOTE — Death Summary Note (Signed)
Death Summary  Craig Owens:[id number] DOB: 1961-08-12 DOA: August 13, 2019  PCP: Lucia Gaskins, MD PCP/Office notified:   Admit date: 2019-08-13 Date of Death: Aug 29, 2019  Final Diagnoses:  Principal Problem:   Cerebral thrombosis with cerebral infarction Active Problems:   Pulmonary embolism ([place])   Pulmonary embolus ([place])   Cavitary pneumonia   Hypoxia   Malnutrition of moderate degree   Acute Respiratory failure with hypoxia [place])   Advanced care planning/counseling discussion   Pulmonary cavitary lesion   Palliative care encounter    1. Acute cardiopulmonary arrest is the immediate cause of death. 2. Multiple acute hemic infarcts. 3. Acute hypoxic respiratory failure 4. Cavitary pneumonia  History of present illness: This is a 58 yo male with history of COPD, tobacco abuse, Non obstructive CAD, THC use remote stroke just recently hospitalized for hypoxic respiratory fialure from 10/13-10/27. Noted to have CT angiogram which showed PE in bilateral distal main PA with extension into the upper lobes and lower lobes bialterally. There was evidence of RV strain as well. There was also noted to be a cavitary lesion on CT chest for which patient finsished a course of Unasyn.  PAtietn notes he has been home for 3 days. Had increased WOB. Increased sputum productin with some clots in it. He tried increasing oxygen to 6 liters but no benefit. In addition to this notes that he has been taking eliquis. No fevers no chills. Of note patient notes 30 lb weight loss in past few months. No traveling in [place] has always lived here. Was incacerated for 2.5 years. No homelessness. No IVDU.    Patient seen at outside ED for worsneing SOB and hypoxia. CTA attained and notable for worsening Cavitary lesion in left upper lobe and bilateral acute PE's with evidence of right hearts strain. Other findings include normal CBC with diff with normal hgb, cmp with normal creatinine and bicarb of 19. Troponin  was 29. LA 1.9. BNP 163. Covid negative at outside ED   Hospital Course:  Patient is a 58 year old-year-old male with past medical history of hyperperlipidemia, diabetes,COPD, smoker who was initially admitted with shortness of breath on November 1.  He was initially hospitalized from 10/13-10/27 at [facility] hospital for acute pulmonary embolism and cavitary lung lesion concerning for possible pneumonia.  He was discharged on 4 L of oxygen and Eliquis with plan to follow-up as an outpatient for COPD.  On 08/13/2023 he presented with worsening dyspnea.  Work-up revealed worsening left-sided cavitary lung mass concerning for malignancy.  He also reported significant unintentional weight loss and was hypotensive. He was admitted to ICU for acute hypoxic respiratory failure requiring high flow.   He was being cared by [facility] and treated with antibiotics.  Respiratory status has slowly improved ans he is on oxygen at 4 L/min.  Patient transferred to [facility] service on 07/24/2019. -Code stroke was called on 07/26/2019 due to left-sided weakness.  Imaging studies on 07/27/2019 confirmed acute strokes.  Neurology was consulted.  Echo showed 55 to 60% ejection fraction, and no source of embolus.  Due to poor prognosis, palliative care was consulted who had long discussion of the family and patient admitted DNR and eventually comfort care.  Plan was for him to go home with hospice but unfortunately patient declined rapidly here requiring multiple doses of pain medication for comfort and eventually was transitioned to morphine drip on 07/29/2019 and subsequently he passed away at 12:30 PM on 2019/08/29.  Time: 20 minutes  Signed:  Darliss Cheney  [facility] Aug 22, 2019, 1:01 PM

## 2019-08-14 NOTE — Progress Notes (Signed)
Patient expired at 1230.  Anson Fret RN, Erasmo Leventhal RN, and [employer]  RN all auscultated for heart sounds and found none.   Time of death pronounced at 80.  Trinidad Curet NP from [facility] arrived a few minutes later to the room.  Patient positioned and covered.  Daughter Jana Half was called and notified he was quickly  passing at 1225 and was told to come to the hospital.

## 2019-08-14 NOTE — TOC Progression Note (Addendum)
Transition of Care [facility]) - Progression Note    Patient Details  Name: Craig Owens MRN: [id number] Date of Birth: 09/18/1960  Transition of Care [facility]) CM/SW Contact  Zenon Mayo, RN Phone Number: 2019/08/13, 9:11 AM  Clinical Narrative:    NCM contacted Cassandra with [facility] , she states she has the referral for home hospice, NCM informed her that they are not sure yet if will be home hospice or residential.  Vito Backers states she will reach out to [place].  NCM contacted Jana Half and informed her that Cassandra with [facility] will be contacting her.  Looks like patient not doing good today, informed Cassandra to wait to hear from [facility] before preceding , because may need Hospice facility.   Expected Discharge Plan: Home w Hospice Care Barriers to Discharge: Continued Medical Work up  Expected Discharge Plan and Services Expected Discharge Plan: [place] In-house Referral: NA Discharge Planning Services: CM Consult Post Acute Care Choice: [place] arrangements for the past 2 months: Single Family Home                                       Social Determinants of Health (SDOH) Interventions    Readmission Risk Interventions Readmission Risk Prevention Plan 07/19/2019 07/10/2019  Transportation Screening Complete Complete  Medication Review Press photographer) Complete Complete  PCP or Specialist appointment within 3-5 days of discharge Complete Complete  HRI or [place] Complete Complete  SW Recovery Care/Counseling Consult Complete Complete  Palliative Care Screening Not Applicable Not [place] Not Applicable Not Applicable  Some recent data might be hidden

## 2019-08-14 NOTE — Progress Notes (Signed)
Patient mildly responsive during vital signs, does not open eyes, no liquid medications this am due to inability to swallow without risk of aspiration

## 2019-08-14 NOTE — TOC Progression Note (Signed)
Transition of Care [facility]) - Progression Note    Patient Details  Name: Craig Owens MRN: [id number] Date of Birth: [birth year]  Transition of Care [facility]) CM/SW Contact  Zenon Mayo, RN Phone Number: August 04, 2019, 12:37 PM  Clinical Narrative:    Per Palliative, patient has expired.  NCM notified Cassandra with [facility].   Expected Discharge Plan: Home w Hospice Care Barriers to Discharge: Continued Medical Work up  Expected Discharge Plan and Services Expected Discharge Plan: [place] In-house Referral: NA Discharge Planning Services: CM Consult Post Acute Care Choice: [place] arrangements for the past 2 months: Single Family Home                                       Social Determinants of Health (SDOH) Interventions    Readmission Risk Interventions Readmission Risk Prevention Plan 07/19/2019 07/10/2019  Transportation Screening Complete Complete  Medication Review Press photographer) Complete Complete  PCP or Specialist appointment within 3-5 days of discharge Complete Complete  HRI or [place] Complete Complete  SW Recovery Care/Counseling Consult Complete Complete  Palliative Care Screening Not Applicable Not [place] Not Applicable Not Applicable  Some recent data might be hidden

## 2019-08-14 NOTE — Progress Notes (Signed)
Patient ID: Craig Owens, male   DOB: 1961-05-24, 58 y.o.   MRN: [id number]  This NP visited patient at the bedside as a follow up for palliative medicine needs and emotional support.  Patient was shifted to a full comfort path yesterday  Patient is unresponsive to gentle touch and verbal stimuli.  He appears generally uncomfortable, he is dyspneic and with increased oral pharyngeal secretions.  Discussed with nursing changes to orders to titrate morphine drip, utilize Ativan and added Robinul for secretions.  Placed call to daughter/Martha for continued conversation regarding current medical situation and discussion around transition of care. I discussed with daughter that patient appeared to be transitioning at end of life, and that prognosis was likely hours to days. I strongly recommended that patient remain here in the hospital for end-of-life care or transition to a residential hospice.  However Jana Half is adamant that she wants her father home at end of life, "his main request of me is that he would die at home".  Working with case Freight forwarder and hospice of [facility] a plan was beginning to be put in place in an attempt to transition the patient home for end-of-life however he declined rapidly over the last several hours and he expired at 1230.  Daughter and her support friends came to the hospital.  I met with them to answer questions and address concerns.  Emotional support offered.  Although daughter had wished she could have been here with her father she understands that "this is not in our time".  We discussed that she was at home trying to honor her father's wishes in an attempt to get him home.  Created space and opportunity for Jana Half to share her thoughts and feelings about her father.  She expresses love for her father who she describes as a "kind man but who did not take any crap either".  She shares this lovingly.  She speaks to his love of animals and at least 6 cats and dogs  at home.  We discussed the logistics of cremation and EOL service.. Emotional support offered  Questions and concerns addressed   Discussed with bedside nursing  Total time spent on the unit was 60 minutes   Greater than 50% of the time was spent in counseling and coordination of care  Wadie Lessen NP  Palliative Medicine Team Team Phone # [phone] Pager [phone]

## 2019-08-14 NOTE — Progress Notes (Signed)
Unused unopened bag of morphine returned to Craig Owens in the inpatient pharmacy

## 2019-08-14 NOTE — Progress Notes (Addendum)
Patient belongings given to daughter along with hand prints, patient placement number given to her for her funeral home decision.  All family members walked to the main entrance of the hospital.

## 2019-08-14 DEATH — deceased

## 2019-08-29 LAB — ACID FAST CULTURE WITH REFLEXED SENSITIVITIES (MYCOBACTERIA): Acid Fast Culture: NEGATIVE

## 2019-09-04 ENCOUNTER — Inpatient Hospital Stay: Payer: Medicare HMO | Admitting: Pulmonary Disease

## 2020-07-09 IMAGING — CT CT ANGIO CHEST
2 of 7 series · 17 of 46 positions shown · IV contrast (omnipaque)
Comparison: A out and there is another number there again 80
general, number of like a vague from trace of chronic all critical
resolved benign stomach with right number cm I am [id number] focus

CLINICAL DATA: Pt brought in by [facility] with c/o sudden onset of SOB
that started before noon. Pt was discharged from hospital 2 days ago
after a 15 day stay for multiple PEs. EMS reports lung sounds clear.
Pt reports its easier to breathe when he lays on his right side. Pt
reports he is supposed to wear 6L of O2 via [HOSPITAL] since he came home
from the hospital. Denies fever, pain.

EXAM:
CT ANGIOGRAPHY CHEST WITH CONTRAST
TECHNIQUE: Multidetector CT imaging of the chest was performed using the
standard protocol during bolus administration of intravenous
contrast. Multiplanar CT image reconstructions and MIPs were
obtained to evaluate the vascular anatomy.
CONTRAST:  100mL OMNIPAQUE IOHEXOL 350 MG/ML SOLN

[Series 5: pe axial thins · axial · 0.65mm/px · z∈[+1199,+1499]mm · 14 of 342 slices shown]
[im 21/342  lung]
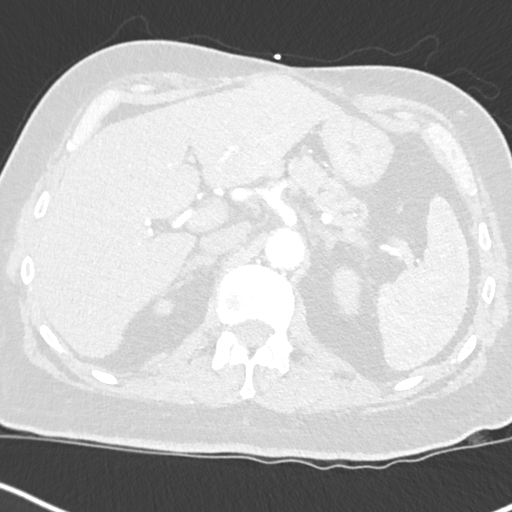
[im 41/342  soft-tissue]
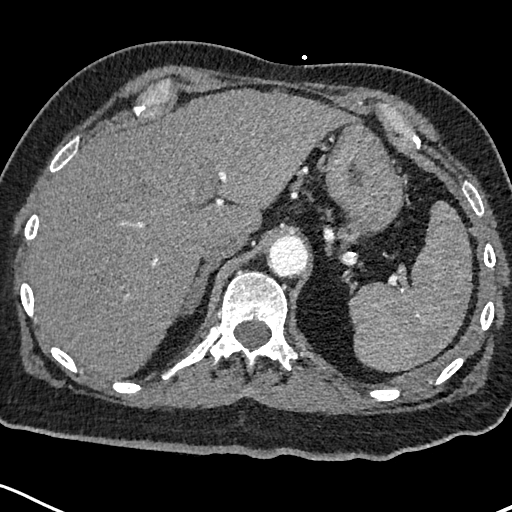
[im 61/342  lung]
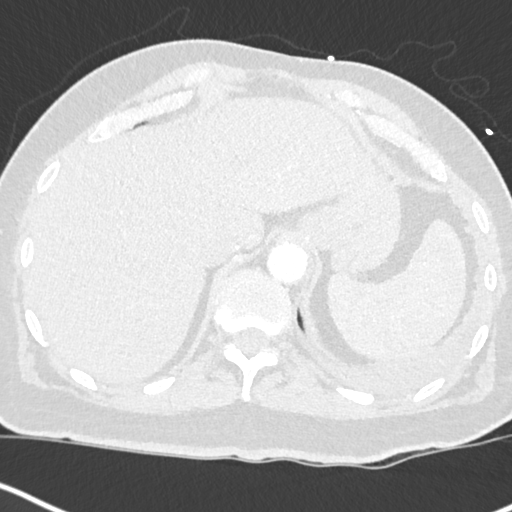
[im 101/342  soft-tissue]
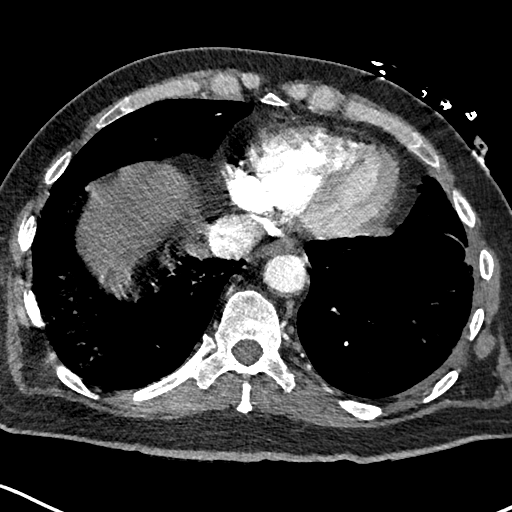
[im 121/342  lung]
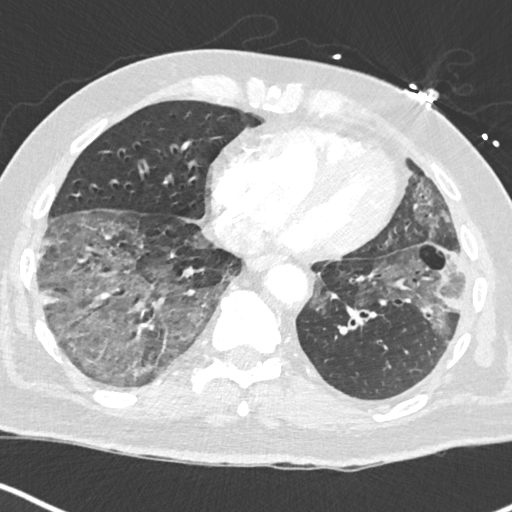
[im 141/342  soft-tissue]
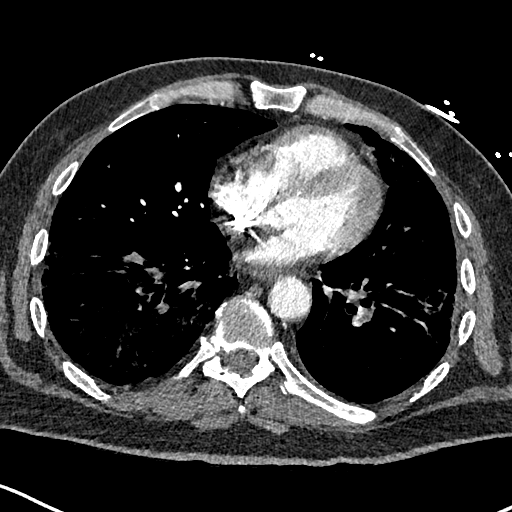
[im 161/342  lung]
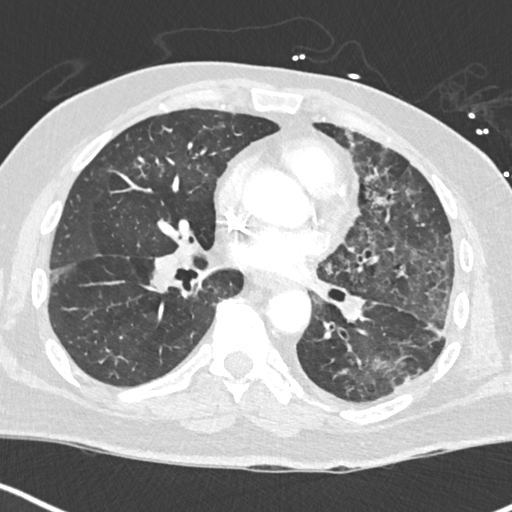
[im 181/342  soft-tissue]
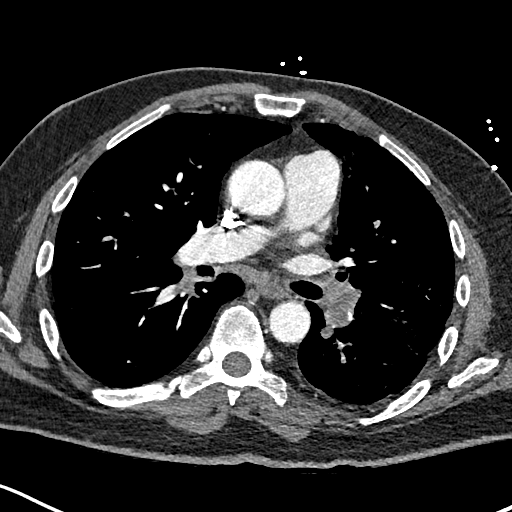
[im 201/342  lung]
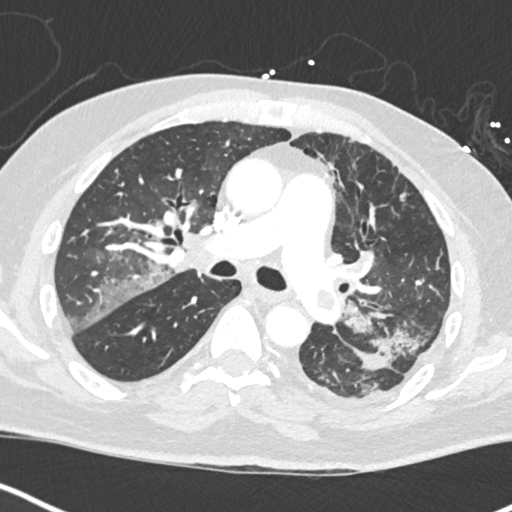
[im 221/342  soft-tissue]
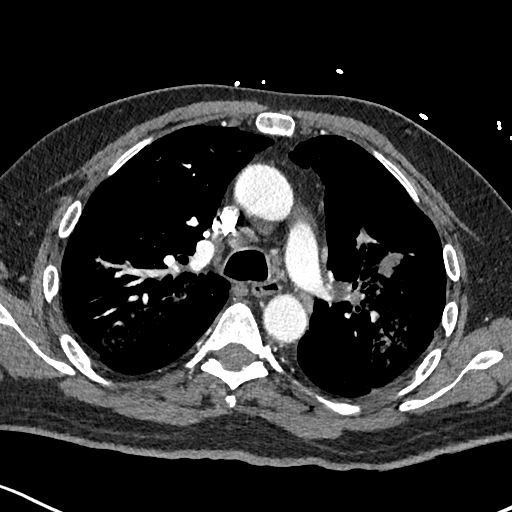
[im 261/342  lung]
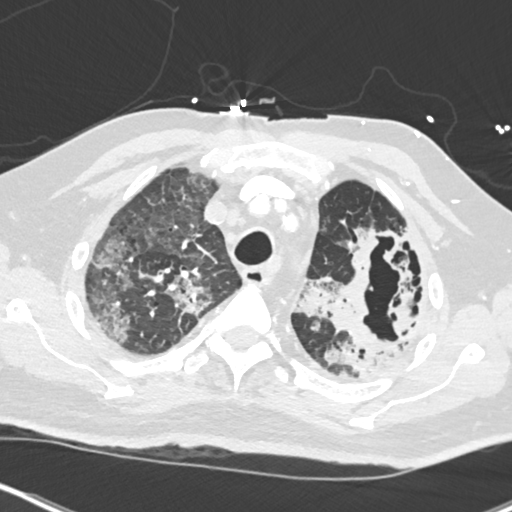
[im 281/342  soft-tissue]
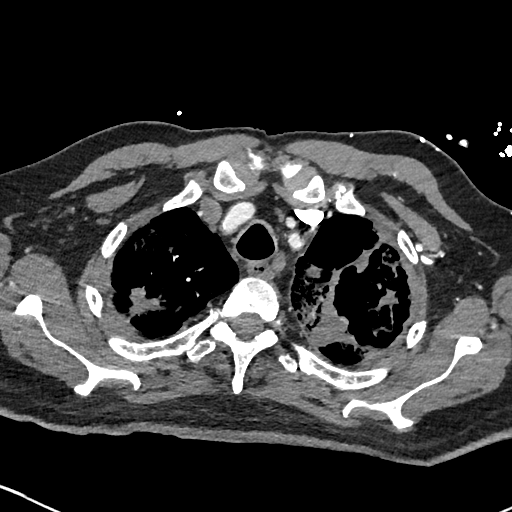
[im 301/342  lung]
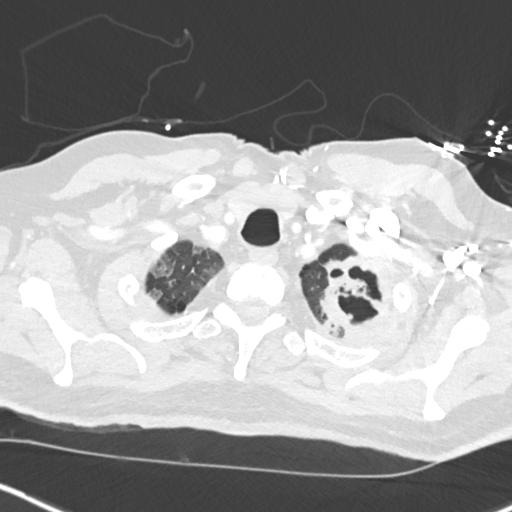
[im 321/342  soft-tissue]
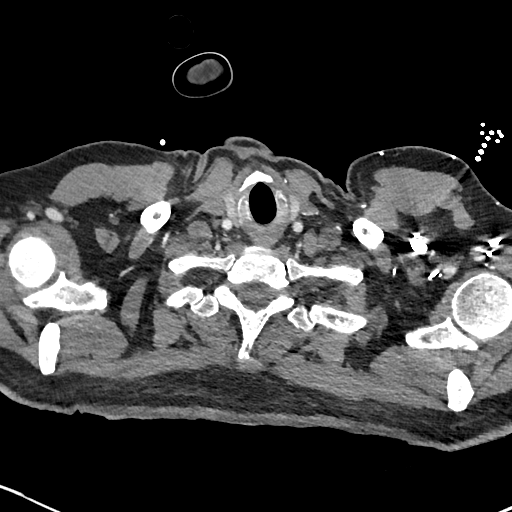

[Series 8: cor soft · coronal · 0.68mm/px · 3 of 142 slices shown]
[im 36/142  soft-tissue]
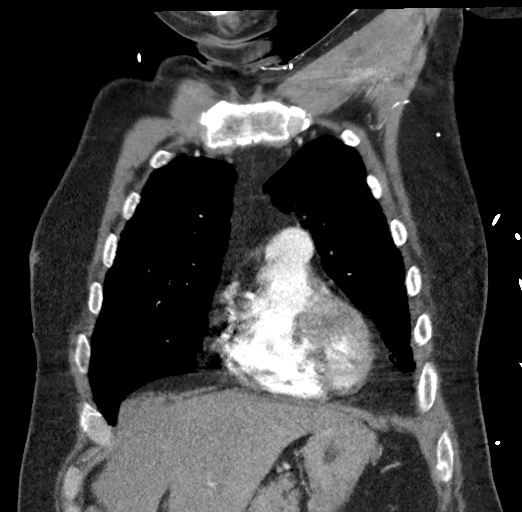
[im 71/142  soft-tissue]
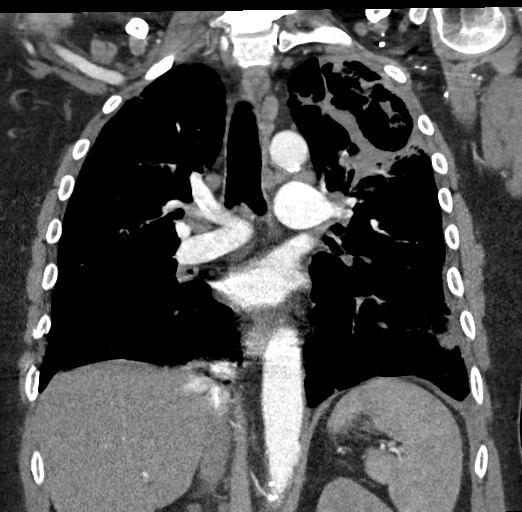
[im 106/142  soft-tissue]
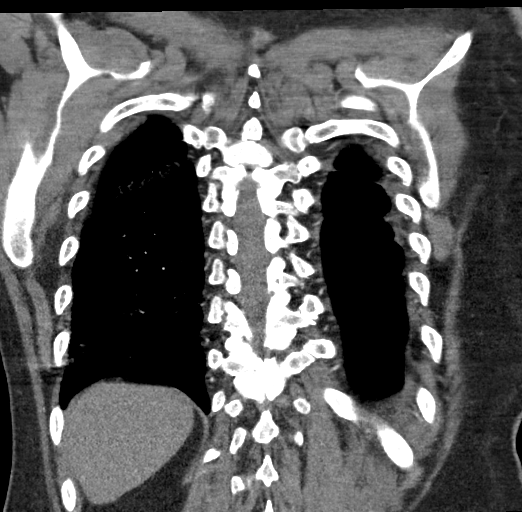

[17 of 46 positions shown; findings below may reference images not displayed]

FINDINGS: Cardiovascular: There is satisfactory opacification of the pulmonary
arteries. There are multiple bilateral pulmonary emboli. On the
right, acute thrombus mostly fills the intralobar pulmonary artery
extending to the segmental branches to the right lower lobe. On the
left pulmonary emboli are more extensive extending into segmental
branches of all lobes from the peripheral pulmonary artery. There is
enlargement of the pulmonary arteries, main measuring 3.5 cm and
left 2.9 cm.

Positive for acute PE with CT evidence of right heart strain (RV/LV
Ratio = 1.6) consistent with at least submassive (intermediate risk)
PE. The presence of right heart strain has been associated with an
increased risk of morbidity and mortality. Please activate Code PE
by paging [phone].

Heart is normal in size. No pericardial effusion. Three-vessel
coronary artery calcifications. Aorta is normal in caliber. No
dissection. Mild atherosclerosis.

Mediastinum/Nodes: Prominent left neck base lymph node, 8 mm in
short axis. There are scattered prominent mediastinal lymph nodes.
Left superior paratracheal node measures 1 cm short axis. Precarinal
node measures 9 mm in short axis. No mediastinal or hilar masses.
Trachea and esophagus are unremarkable.

Lungs/Pleura: Cavitary mass/lesion in the left upper lobe has
significantly progressed compared to the prior CT. This currently
measures approximately 8.6 by 6.5 x 8.4 cm in size.

There is adjacent confluent and ground-glass opacity in the left
upper lobe. There is additional ground-glass opacity throughout much
the right lower lobe, seen to a lesser degree in the left lower lobe
and right upper lobe. Ground-glass opacities have worsened compared
to the prior CT, with the increase most evident in the right lower
lobe. Large nodule with spiculated margins noted in the right upper
lobe, measuring 2.6 x 2.1 x 1.8 cm without change from the prior CT.

No pleural effusion.  No pneumothorax.

Upper Abdomen: No acute abnormality.

Musculoskeletal: No fracture or acute finding. Sclerosis noted in
upper thoracic vertebra stable from the prior CT.

Review of the MIP images confirms the above findings.
IMPRESSION: 1. Bilateral, fairly extensive acute pulmonary emboli. Evidence of
right heart strain.
2. Cavitary lesion in the left upper lobe has significantly
increased in size from prior CT. This rapid changes consistent with
infection, specifically cavitary pneumonia.
3. There is also been worsening of ground-glass lung opacities
bilaterally with the increase most evident in the right lower lobe.
4. Spiculated nodule in the right upper lobe is stable suspicious
for malignancy.

Aortic Atherosclerosis ([id number]-[id number].D).
